# Patient Record
Sex: Female | Born: 1940 | Race: White | Hispanic: No | Marital: Married | Smoking: Former smoker
Health system: Southern US, Community
[De-identification: ages and names within clinical notes are randomized; demographics above are authoritative.]

## PROBLEM LIST (undated history)

## (undated) DIAGNOSIS — K8020 Calculus of gallbladder without cholecystitis without obstruction: Secondary | ICD-10-CM

## (undated) DIAGNOSIS — M545 Low back pain, unspecified: Secondary | ICD-10-CM

## (undated) DIAGNOSIS — M21611 Bunion of right foot: Secondary | ICD-10-CM

## (undated) DIAGNOSIS — E785 Hyperlipidemia, unspecified: Secondary | ICD-10-CM

## (undated) DIAGNOSIS — I10 Essential (primary) hypertension: Secondary | ICD-10-CM

## (undated) DIAGNOSIS — M21612 Bunion of left foot: Secondary | ICD-10-CM

## (undated) DIAGNOSIS — G4700 Insomnia, unspecified: Secondary | ICD-10-CM

## (undated) DIAGNOSIS — N882 Stricture and stenosis of cervix uteri: Secondary | ICD-10-CM

## (undated) DIAGNOSIS — I447 Left bundle-branch block, unspecified: Secondary | ICD-10-CM

## (undated) DIAGNOSIS — N816 Rectocele: Secondary | ICD-10-CM

## (undated) DIAGNOSIS — M722 Plantar fascial fibromatosis: Secondary | ICD-10-CM

## (undated) DIAGNOSIS — I639 Cerebral infarction, unspecified: Secondary | ICD-10-CM

## (undated) DIAGNOSIS — N281 Cyst of kidney, acquired: Secondary | ICD-10-CM

## (undated) DIAGNOSIS — K649 Unspecified hemorrhoids: Secondary | ICD-10-CM

## (undated) DIAGNOSIS — R413 Other amnesia: Secondary | ICD-10-CM

## (undated) DIAGNOSIS — D1803 Hemangioma of intra-abdominal structures: Secondary | ICD-10-CM

## (undated) HISTORY — DX: Calculus of gallbladder without cholecystitis without obstruction: K80.20

## (undated) HISTORY — DX: Other amnesia: R41.3

## (undated) HISTORY — DX: Stricture and stenosis of cervix uteri: N88.2

## (undated) HISTORY — DX: Cyst of kidney, acquired: N28.1

## (undated) HISTORY — PX: COLONOSCOPY: SHX174

## (undated) HISTORY — DX: Unspecified hemorrhoids: K64.9

## (undated) HISTORY — DX: Cerebral infarction, unspecified: I63.9

## (undated) HISTORY — DX: Low back pain, unspecified: M54.50

## (undated) HISTORY — DX: Bunion of left foot: M21.612

## (undated) HISTORY — DX: Rectocele: N81.6

## (undated) HISTORY — PX: APPENDECTOMY: SHX54

## (undated) HISTORY — DX: Essential (primary) hypertension: I10

## (undated) HISTORY — DX: Left bundle-branch block, unspecified: I44.7

## (undated) HISTORY — DX: Hyperlipidemia, unspecified: E78.5

## (undated) HISTORY — PX: TONSILLECTOMY: SUR1361

## (undated) HISTORY — DX: Insomnia, unspecified: G47.00

## (undated) HISTORY — DX: Bunion of right foot: M21.611

## (undated) HISTORY — DX: Plantar fascial fibromatosis: M72.2

## (undated) HISTORY — DX: Hemangioma of intra-abdominal structures: D18.03

## (undated) HISTORY — DX: Low back pain: M54.5

---

## 2001-06-15 ENCOUNTER — Encounter: Admission: RE | Admit: 2001-06-15 | Discharge: 2001-06-15 | Payer: Self-pay | Admitting: Family Medicine

## 2001-06-15 ENCOUNTER — Encounter: Payer: Self-pay | Admitting: Family Medicine

## 2002-04-26 ENCOUNTER — Other Ambulatory Visit: Admission: RE | Admit: 2002-04-26 | Discharge: 2002-04-26 | Payer: Self-pay | Admitting: Family Medicine

## 2002-09-18 ENCOUNTER — Ambulatory Visit (HOSPITAL_COMMUNITY): Admission: RE | Admit: 2002-09-18 | Discharge: 2002-09-18 | Payer: Self-pay | Admitting: Gastroenterology

## 2003-04-29 ENCOUNTER — Other Ambulatory Visit: Admission: RE | Admit: 2003-04-29 | Discharge: 2003-04-29 | Payer: Self-pay | Admitting: Family Medicine

## 2004-03-03 ENCOUNTER — Other Ambulatory Visit: Admission: RE | Admit: 2004-03-03 | Discharge: 2004-03-03 | Payer: Self-pay | Admitting: Family Medicine

## 2005-03-26 ENCOUNTER — Other Ambulatory Visit: Admission: RE | Admit: 2005-03-26 | Discharge: 2005-03-26 | Payer: Self-pay | Admitting: Family Medicine

## 2005-12-20 ENCOUNTER — Encounter: Admission: RE | Admit: 2005-12-20 | Discharge: 2005-12-20 | Payer: Self-pay | Admitting: Orthopedic Surgery

## 2009-05-08 ENCOUNTER — Encounter: Admission: RE | Admit: 2009-05-08 | Discharge: 2009-05-08 | Payer: Self-pay | Admitting: Family Medicine

## 2011-01-01 ENCOUNTER — Other Ambulatory Visit: Payer: Self-pay | Admitting: Family Medicine

## 2011-01-01 DIAGNOSIS — D1803 Hemangioma of intra-abdominal structures: Secondary | ICD-10-CM

## 2011-01-07 ENCOUNTER — Other Ambulatory Visit: Payer: Self-pay

## 2011-01-08 ENCOUNTER — Ambulatory Visit
Admission: RE | Admit: 2011-01-08 | Discharge: 2011-01-08 | Disposition: A | Discharge status: Home / Self Care | Payer: Medicare Other | Source: Ambulatory Visit | Attending: Family Medicine | Admitting: Family Medicine

## 2011-01-08 DIAGNOSIS — D1803 Hemangioma of intra-abdominal structures: Secondary | ICD-10-CM

## 2011-04-23 NOTE — Op Note (Signed)
NAMEINDIRA, Jennifer Arroyo                         ACCOUNT NO.:  [id number]   MEDICAL RECORD NO.:  [id number]                   PATIENT TYPE:  AMB   LOCATION:  ENDO                                 FACILITY:  [facility]   PHYSICIAN:  Anselmo Rod, M.D.               DATE OF BIRTH:  [birth year]   DATE OF PROCEDURE:  09/18/2002  DATE OF DISCHARGE:                                 OPERATIVE REPORT   PROCEDURE:  Screening colonoscopy.   ENDOSCOPIST:  Anselmo Rod, M.D.   INSTRUMENT USED:  Olympus video colonoscope.   INDICATION FOR PROCEDURE:  Rectal bleeding in a 70 year old white female.  Rule out colonic polyps, masses, hemorrhoids, etc.   PREPROCEDURE PREPARATION:  Informed consent was procured from the patient.  The patient was fasted for eight hours prior to the procedure and prepped  with a bottle of magnesium citrate and a gallon of NuLytely then night prior  to the procedure.   PHYSICAL EXAMINATION:  VITAL SIGNS:  The patient had stable vital signs.  NECK:  Supple.  CHEST:  Clear to auscultation.  S1, S2 regular.  ABDOMEN:  Soft with normal bowel sounds.   DESCRIPTION OF PROCEDURE:  The patient was placed in the left lateral  decubitus position and sedated with 80 mg of Demerol and 8 mg of Versed  intravenously.  Once the patient was adequately sedate and maintained on low-  flow oxygen and continuous cardiac monitoring, the Olympus video colonoscope  was advanced from the rectum to the cecum and terminal ileum without  difficulty.  The entire colonic mucosa appeared healthy with a normal  vascular pattern.  No masses, polyps, erosions, ulcerations, or diverticula  were seen.  The patient had a small external hemorrhoid seen on anal  inspection and moderate-sized nonbleeding internal hemorrhoid seen on  retroflexion.  The patient tolerated the procedure well without  complication.   IMPRESSION:  1. Normal-appearing colon up to the terminal ileum.  2. Small external  hemorrhoid.  3. Moderate-sized nonbleeding internal hemorrhoid.  4. No masses, polyps, or diverticula seen.   RECOMMENDATIONS:  1. A high-fiber diet with liberal fluid intake has been advocated for the     patient.  2. Anusol-HC 2.5% suppositories to be tried one p.r. q.h.s., #30 have been     prescribed with one refill.  3.     Outpatient follow-up in the next two weeks for further recommendations.  4. Repeat colorectal cancer screening in the next 10 years unless the     patient develops any abnormal symptoms in the interim.                                                 Anselmo Rod, M.D.    JNM/MEDQ  D:  09/18/2002  T:  09/18/2002  Job:  [id number]   cc:   Talmadge Coventry, M.D.

## 2012-09-21 ENCOUNTER — Ambulatory Visit
Admission: RE | Admit: 2012-09-21 | Discharge: 2012-09-21 | Disposition: A | Discharge status: Home / Self Care | Payer: Medicare Other | Source: Ambulatory Visit | Attending: Family Medicine | Admitting: Family Medicine

## 2012-09-21 ENCOUNTER — Other Ambulatory Visit: Payer: Self-pay | Admitting: Family Medicine

## 2012-09-21 DIAGNOSIS — N281 Cyst of kidney, acquired: Secondary | ICD-10-CM

## 2012-09-28 ENCOUNTER — Other Ambulatory Visit: Payer: Self-pay | Admitting: Family Medicine

## 2012-09-28 DIAGNOSIS — N281 Cyst of kidney, acquired: Secondary | ICD-10-CM

## 2012-10-04 ENCOUNTER — Ambulatory Visit
Admission: RE | Admit: 2012-10-04 | Discharge: 2012-10-04 | Disposition: A | Discharge status: Home / Self Care | Payer: Medicare Other | Source: Ambulatory Visit | Attending: Family Medicine | Admitting: Family Medicine

## 2012-10-04 DIAGNOSIS — N281 Cyst of kidney, acquired: Secondary | ICD-10-CM

## 2012-10-04 MED ORDER — GADOBENATE DIMEGLUMINE 529 MG/ML IV SOLN
15.0000 mL | Freq: Once | INTRAVENOUS | Status: AC | PRN
  Administered 2012-10-04: 15 mL via INTRAVENOUS

## 2014-11-23 ENCOUNTER — Encounter: Payer: Self-pay | Admitting: *Deleted

## 2018-01-06 DIAGNOSIS — I639 Cerebral infarction, unspecified: Secondary | ICD-10-CM

## 2018-01-06 HISTORY — DX: Cerebral infarction, unspecified: I63.9

## 2019-01-10 ENCOUNTER — Other Ambulatory Visit: Payer: Self-pay | Admitting: Family Medicine

## 2019-01-10 ENCOUNTER — Encounter: Payer: Self-pay | Admitting: *Deleted

## 2019-01-10 DIAGNOSIS — R413 Other amnesia: Secondary | ICD-10-CM

## 2019-01-11 ENCOUNTER — Telehealth: Payer: Self-pay | Admitting: Neurology

## 2019-01-11 ENCOUNTER — Ambulatory Visit: Payer: Medicare Other | Admitting: Neurology

## 2019-01-11 ENCOUNTER — Encounter: Payer: Self-pay | Admitting: Neurology

## 2019-01-11 VITALS — BP 140/88 | HR 93 | Ht 64.0 in | Wt 171.5 lb

## 2019-01-11 DIAGNOSIS — R413 Other amnesia: Secondary | ICD-10-CM | POA: Insufficient documentation

## 2019-01-11 DIAGNOSIS — R269 Unspecified abnormalities of gait and mobility: Secondary | ICD-10-CM | POA: Insufficient documentation

## 2019-01-11 DIAGNOSIS — G3281 Cerebellar ataxia in diseases classified elsewhere: Secondary | ICD-10-CM

## 2019-01-11 MED ORDER — MEMANTINE HCL 10 MG PO TABS: 10.0000 mg | ORAL_TABLET | Freq: Two times a day (BID) | ORAL | 11 refills | Status: DC

## 2019-01-11 NOTE — Telephone Encounter (Signed)
[employer] Medicare order sent to [facility]. No auth they will reach out to the pt to schedule.  °

## 2019-01-11 NOTE — Progress Notes (Signed)
PATIENT: Jennifer Arroyo DOB: [birth year]  Chief Complaint  Patient presents with  . Memory Loss    MMSE 27/30 - 12 animals.  She is here with her husband, Juanda Crumble, to have her declining memory evaluated.   Marland Kitchen PCP    Kathyrn Lass, MD     HISTORICAL  Jennifer Arroyo is a 78 year old female, seen in request by her primary care physician Dr. Sabra Heck, Lattie Haw for evaluation of memory loss, gait abnormality, she is accompanied by her husband at today's visit, initial evaluation was on January 11, 2019.  I have reviewed and summarized the referring note from the referring physician. 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, has no children, enjoys playing bridges regularly, her older brother suffered vascular dementia, mother died at 64, suffered stroke after giving birth to her  She was noted to have gradual onset memory loss since 2018, getting worse in 2019, it is hard for her to keep up with her bridge game, difficulty concentrate, following conversations, no longer active at home, tends to stay up late, and get up late during the day,  She has history of chronic low back pain, deformity of bilateral feet, gradually increased gait abnormality  Laboratory evaluation in January 2020, showed LDL 92, normal CMP creatinine of 0.74, CBC hemoglobin of 15, vitamin B12 429, normal TSH 1.78   REVIEW OF SYSTEMS: Full 14 system review of systems performed and notable only for memory loss All other review of systems were negative.  ALLERGIES: Allergies  Allergen Reactions  . Amoxicillin Nausea Only  . Ceclor [Cefaclor] Hives  . Levaquin [Levofloxacin] Other (See Comments)  . Nystatin Other (See Comments)    Black scale around teeth.    HOME MEDICATIONS: Current Outpatient Medications  Medication Sig Dispense Refill  . ALPRAZolam (XANAX) 0.25 MG  tablet Take 0.25 mg by mouth at bedtime as needed for sleep.     Marland Kitchen aspirin 325 MG tablet Take 325 mg by mouth daily.    Marland Kitchen CALCIUM MAGNESIUM 750 PO Take by mouth.    . Coenzyme Q10 (CO Q-10) 100 MG CAPS Take 1 capsule by mouth daily.    . Cyanocobalamin (VITAMIN B-12 PO) Take 1 tablet by mouth daily.    . hydrochlorothiazide (HYDRODIURIL) 25 MG tablet Take 25 mg by mouth daily.    . meloxicam (MOBIC) 7.5 MG tablet Take 7.5 mg by mouth as needed.     . metoprolol succinate (TOPROL-XL) 25 MG 24 hr tablet Take 25 mg by mouth daily.    . Omega-3 Fatty Acids (OMEGA 3 PO) Take 1,200 mg by mouth daily.    . rosuvastatin (CRESTOR) 10 MG tablet Take 10 mg by mouth daily.    Marland Kitchen VITAMIN D PO Take 2,000 Units by mouth daily.    Marland Kitchen zolpidem (AMBIEN) 10 MG tablet Take 10 mg by mouth at bedtime as needed for sleep.     No current facility-administered medications for this visit.     PAST MEDICAL HISTORY: Past Medical History:  Diagnosis Date  . Bilateral bunions    hammer toe  . Cervical os stenosis   . Gallstones   . Hemangioma of liver    probable  . Hemorrhoids   . Hyperlipemia   . Hypertension   . Insomnia   . Left bundle branch block   . Left bundle branch block   .  Low back pain   . Memory loss   . Plantar fasciitis   . Rectocele   . Renal cyst     PAST SURGICAL HISTORY: Past Surgical History:  Procedure Laterality Date  . APPENDECTOMY    . COLONOSCOPY    . TONSILLECTOMY      FAMILY HISTORY: Family History  Problem Relation Age of Onset  . Liver cancer Father   . Hypertension Brother   . Hyperlipidemia Brother   . Stroke Mother   . Rheumatic fever Mother   . Heart disease Mother     SOCIAL HISTORY: Social History   Socioeconomic History  . Marital status: Married    Spouse name: Not on file  . Number of children: 0  . Years of education: college  . Highest education level: Not on file  Occupational History  . Occupation: Retired Education officer, museum  Social Needs  .  Financial resource strain: Not on file  . Food insecurity:    Worry: Not on file    Inability: Not on file  . Transportation needs:    Medical: Not on file    Non-medical: Not on file  Tobacco Use  . Smoking status: Former Smoker    Types: Cigarettes  . Smokeless tobacco: Never Used  . Tobacco comment: quit at age 59, 39 PPD x 10 year history  Substance and Sexual Activity  . Alcohol use: Yes    Comment: rare  . Drug use: Never  . Sexual activity: Not on file  Lifestyle  . Physical activity:    Days per week: Not on file    Minutes per session: Not on file  . Stress: Not on file  Relationships  . Social connections:    Talks on phone: Not on file    Gets together: Not on file    Attends religious service: Not on file    Active member of club or organization: Not on file    Attends meetings of clubs or organizations: Not on file    Relationship status: Not on file  . Intimate partner violence:    Fear of current or ex partner: Not on file    Emotionally abused: Not on file    Physically abused: Not on file    Forced sexual activity: Not on file  Other Topics Concern  . Not on file  Social History Narrative   Lives at home with husband.   Right-handed.   2 cups caffeine daily.     PHYSICAL EXAM   Vitals:   01/11/19 0959  BP: 140/88  Pulse: 93  Weight: 171 lb 8 oz (77.8 kg)  Height: 5\' 4"  (1.626 m)    Not recorded      Body mass index is 29.44 kg/m.  PHYSICAL EXAMNIATION:  Gen: NAD, conversant, well nourised, obese, well groomed                     Cardiovascular: Regular rate rhythm, no peripheral edema, warm, nontender. Eyes: Conjunctivae clear without exudates or hemorrhage Neck: Supple, no carotid bruits. Pulmonary: Clear to auscultation bilaterally   NEUROLOGICAL EXAM: MMSE - Mini Mental State Exam 01/11/2019  Orientation to time 5  Orientation to Place 5  Registration 3  Attention/ Calculation 5  Recall 0  Language- name 2 objects 2    Language- repeat 1  Language- follow 3 step command 3  Language- read & follow direction 1  Write a sentence 1  Copy design 1  Total score  27  animal naming 12.   CRANIAL NERVES: CN II: Visual fields are full to confrontation.  Pupils are round equal and briskly reactive to light. CN III, IV, VI: extraocular movement are normal. No ptosis. CN V: Facial sensation is intact to pinprick in all 3 divisions bilaterally. Corneal responses are intact.  CN VII: Face is symmetric with normal eye closure and smile. CN VIII: Hearing is normal to rubbing fingers CN IX, X: Palate elevates symmetrically. Phonation is normal. CN XI: Head turning and shoulder shrug are intact CN XII: Tongue is midline with normal movements and no atrophy.  MOTOR: She has deformity of bilateral hands, bilateral hammertoes,  REFLEXES: Reflexes are 2+ and symmetric at the biceps, triceps, 3/3 at knees, and decreased at ankles. Plantar responses are flexor.  SENSORY: Length dependent decreased to light touch, pinprick, and vibratory sensation to ankle level  COORDINATION: Rapid alternating movements and fine finger movements are intact. There is no dysmetria on finger-to-nose and heel-knee-shin.    GAIT/STANCE: She needs pushed up to get up from seated position, antalgic, cautious mildly unsteady, difficulty stand up on heels,   DIAGNOSTIC DATA (LABS, IMAGING, TESTING) - I reviewed patient records, labs, notes, testing and imaging myself where available.   ASSESSMENT AND PLAN  Jennifer Arroyo is a 78 y.o. female   Mild cognitive impairment  Mini-Mental Status Examination 27/30  MRI of the brain without contrast  Gait abnormality  Chronic low back pain,  Differentiation diagnosis includes cervical spondylitic myelopathy, lumbar radiculopathy  Proceed with MRI of lumbar spine  Refer to rehab   Marcial Pacas, M.D. Ph.D.  [facility] [address], [place], [place] [address] Ph:  [phone] Fax: [phone]  CC: Kathyrn Lass, MD

## 2019-01-11 NOTE — Telephone Encounter (Signed)
Patient was seen today by Dr. Krista Blue. After her appointment, patient did not stop by check out to make her follow-up. I called and left voicemail for patient to call us back to schedule her 2 month follow up.

## 2019-01-22 ENCOUNTER — Other Ambulatory Visit: Payer: Self-pay

## 2019-01-26 ENCOUNTER — Ambulatory Visit
Admission: RE | Admit: 2019-01-26 | Discharge: 2019-01-26 | Disposition: A | Discharge status: Home / Self Care | Payer: Medicare Other | Source: Ambulatory Visit | Attending: Neurology | Admitting: Neurology

## 2019-01-26 DIAGNOSIS — R413 Other amnesia: Secondary | ICD-10-CM

## 2019-01-26 DIAGNOSIS — G3281 Cerebellar ataxia in diseases classified elsewhere: Secondary | ICD-10-CM | POA: Diagnosis not present

## 2019-01-29 ENCOUNTER — Telehealth: Payer: Self-pay | Admitting: Neurology

## 2019-01-29 NOTE — Telephone Encounter (Signed)
Please call patient, MRI of brain showed evidence of generalized atrophy, deep brain signal abnormalities, these are all chronic changes,  MRI of lumbar spine showed multilevel degenerative changes, variable degree of spinal canal and foraminal narrowing,   I will review films at her next follow-up visit,   1.    At T12-L1, there appears to be degenerative fusion.  There does not appear to be nerve root compression or spinal stenosis. 2.    At L1-L2: There is very severe endplate spurring causing moderate spinal stenosis in both the transverse and AP diameters.  There is moderate left lateral recess stenosis but no definite nerve root compression. 3.    At L2-L3, there appears to be degenerative fusion.  There is severe endplate spurring with a large midline bony ridge.  There is severe left lateral recess stenosis with probable left L3 nerve root compression. 4.    At L3-L4, degenerative changes: Spinal stenosis in the transverse diameter and moderately severe left foraminal narrowing with possible left L3 nerve root compression. 5.    At L4-L5, there are degenerative changes causing moderate left greater than right foraminal narrowing but no nerve root compression or spinal stenosis. 6.    At L5-S1, there is mild anterolisthesis and severe facet hypertrophy.  There does not appear to impede nerve root compression or spinal stenosis. 7.    The bone marrow has a mottled appearance.  This is usually due to age related bone marrow changes if clinically indicated, consider evaluation for paraproteinemia. 8.    Large renal cyst that appears similar in appearance to what was noted on the abdominal MRI from 2013.  IMPRESSION: This MRI of the brain without contrast shows the following: 1.    Moderate generalized cortical atrophy that is more severe in the anterior mesial temporal lobes bilaterally. 2.    Symmetric T2 hyperintensity of the corticospinal tract in the brain and brainstem.  This is  nonspecific but can be seen with neurologic disorders such as amyotrophic lateral sclerosis, primary lateral sclerosis, X-linked CMT and other rare genetic disorders. 3.    Superimposed chronic microvascular ischemic change.  There are no acute findings.

## 2019-01-29 NOTE — Telephone Encounter (Signed)
Spoke to patient's husband on [place].  He is aware of results and verbalized understanding.  He will provide these to the patient.  She will call back with any questions.  She will keep her pending follow up for further review with Dr. Krista Blue.

## 2019-02-19 ENCOUNTER — Ambulatory Visit: Payer: Medicare Other | Admitting: Rehabilitative and Restorative Service Providers"

## 2019-04-02 ENCOUNTER — Other Ambulatory Visit: Payer: Self-pay

## 2019-04-02 ENCOUNTER — Ambulatory Visit (INDEPENDENT_AMBULATORY_CARE_PROVIDER_SITE_OTHER): Payer: Medicare Other | Admitting: Neurology

## 2019-04-02 DIAGNOSIS — R269 Unspecified abnormalities of gait and mobility: Secondary | ICD-10-CM

## 2019-04-02 DIAGNOSIS — R413 Other amnesia: Secondary | ICD-10-CM | POA: Diagnosis not present

## 2019-04-02 MED ORDER — DONEPEZIL HCL 10 MG PO TABS: 10.0000 mg | ORAL_TABLET | Freq: Every day | ORAL | 11 refills | Status: DC

## 2019-04-02 NOTE — Progress Notes (Signed)
PATIENT: Jennifer Arroyo DOB: [birth year]  No chief complaint on file.    HISTORICAL  JOSCELINE Arroyo is a 78 year old female, seen in request by her primary care physician Dr. Sabra Heck, Lattie Haw for evaluation of memory loss, gait abnormality, she is accompanied by her husband at today's visit, initial evaluation was on January 11, 2019.  I have reviewed and summarized the referring note from the referring physician. 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, has no children, enjoys playing bridges regularly, her older brother suffered vascular dementia, mother died at 30, suffered stroke after giving birth to her  She was noted to have gradual onset memory loss since 2018, getting worse in 2019, it is hard for her to keep up with her bridge game, difficulty concentrate, following conversations, no longer active at home, tends to stay up late, and get up late during the day,  She has history of chronic low back pain, deformity of bilateral feet, gradually increased gait abnormality  Laboratory evaluation in January 2020, showed LDL 92, normal CMP creatinine of 0.74, CBC hemoglobin of 15, vitamin B12 429, normal TSH 1.78  Virtual Visit via video  I connected with [name] on 04/02/19 at  by video and verified that I am speaking with the correct person using two identifiers.   I discussed the limitations, risks, security and privacy concerns of performing an evaluation and management service by video and the availability of in person appointments. I also discussed with the patient that there may be a patient responsible charge related to this service. The patient expressed understanding and agreed to proceed.   History of Present Illness: She is with her husband during today's interview, she was noted to have worsening memory loss, tolerating  Namenda 10 mg twice a day, mildly unsteady gait, fell twice since last visit, there was no significant agitations  I personally reviewed MRI of the brain on January 26, 2019, moderate generalized atrophy, most severe in the anterior mesial temporal lobe bilaterally, symmetric T2 hyperintensity of the corticospinal tract in the brain and brainstem, with superimposed chronic microvascular ischemic changes.  MRI of lumbar: Severe multilevel degenerative changes, moderate spinal stenosis at L1-2, variable degree of foraminal narrowing, large renal cyst, that was stable compared to previous appearance from MRI in 2013   Observations/Objective: I have reviewed problem lists, medications, allergies.  MMSE - Mini Mental State Exam 01/11/2019  Orientation to time 5  Orientation to Place 5  Registration 3  Attention/ Calculation 5  Recall 0  Language- name 2 objects 2  Language- repeat 1  Language- follow 3 step command 3  Language- read & follow direction 1  Write a sentence 1  Copy design 1  Total score 27   Assessment and Plan: Mild cognitive impairment  Continue Namenda 10 mg twice a day,  Add on Aricept 10 mg daily  Gait abnormality  Multifactorial, this including deconditioning, aging, cerebral small vessel disease, lumbar stenosis,  Encouraged her moderate exercise    Follow Up Instructions:  In 6 months    I discussed the assessment and treatment plan with the patient. The patient was provided an opportunity to ask questions and all were answered. The patient agreed with the plan and demonstrated an understanding of the instructions.   The patient was advised to call back or seek an in-person evaluation if  the symptoms worsen or if the condition fails to improve as anticipated.  I provided 30 minutes of non-face-to-face time during this encounter.   Marcial Pacas, MD    REVIEW OF SYSTEMS: Full 14 system review of systems performed and notable only for memory loss All other  review of systems were negative.  ALLERGIES: Allergies  Allergen Reactions  . Amoxicillin Nausea Only  . Ceclor [Cefaclor] Hives  . Levaquin [Levofloxacin] Other (See Comments)  . Nystatin Other (See Comments)    Black scale around teeth.    HOME MEDICATIONS: Current Outpatient Medications  Medication Sig Dispense Refill  . ALPRAZolam (XANAX) 0.25 MG tablet Take 0.25 mg by mouth at bedtime as needed for sleep.     Marland Kitchen aspirin 325 MG tablet Take 325 mg by mouth daily.    Marland Kitchen CALCIUM MAGNESIUM 750 PO Take by mouth.    . Coenzyme Q10 (CO Q-10) 100 MG CAPS Take 1 capsule by mouth daily.    . Cyanocobalamin (VITAMIN B-12 PO) Take 1 tablet by mouth daily.    . hydrochlorothiazide (HYDRODIURIL) 25 MG tablet Take 25 mg by mouth daily.    . meloxicam (MOBIC) 7.5 MG tablet Take 7.5 mg by mouth as needed.     . memantine (NAMENDA) 10 MG tablet Take 1 tablet (10 mg total) by mouth 2 (two) times daily. 60 tablet 11  . metoprolol succinate (TOPROL-XL) 25 MG 24 hr tablet Take 25 mg by mouth daily.    . Omega-3 Fatty Acids (OMEGA 3 PO) Take 1,200 mg by mouth daily.    . rosuvastatin (CRESTOR) 10 MG tablet Take 10 mg by mouth daily.    Marland Kitchen VITAMIN D PO Take 2,000 Units by mouth daily.    Marland Kitchen zolpidem (AMBIEN) 10 MG tablet Take 10 mg by mouth at bedtime as needed for sleep.     No current facility-administered medications for this visit.     PAST MEDICAL HISTORY: Past Medical History:  Diagnosis Date  . Bilateral bunions    hammer toe  . Cervical os stenosis   . Gallstones   . Hemangioma of liver    probable  . Hemorrhoids   . Hyperlipemia   . Hypertension   . Insomnia   . Left bundle branch block   . Left bundle branch block   . Low back pain   . Memory loss   . Plantar fasciitis   . Rectocele   . Renal cyst     PAST SURGICAL HISTORY: Past Surgical History:  Procedure Laterality Date  . APPENDECTOMY    . COLONOSCOPY    . TONSILLECTOMY      FAMILY HISTORY: Family History   Problem Relation Age of Onset  . Liver cancer Father   . Hypertension Brother   . Hyperlipidemia Brother   . Stroke Mother   . Rheumatic fever Mother   . Heart disease Mother     SOCIAL HISTORY: Social History   Socioeconomic History  . Marital status: Married    Spouse name: Not on file  . Number of children: 0  . Years of education: college  . Highest education level: Not on file  Occupational History  . Occupation: Retired Education officer, museum  Social Needs  . Financial resource strain: Not on file  . Food insecurity:    Worry: Not on file    Inability: Not on file  . Transportation needs:    Medical: Not on file    Non-medical: Not on file  Tobacco Use  .  Smoking status: Former Smoker    Types: Cigarettes  . Smokeless tobacco: Never Used  . Tobacco comment: quit at age 33, 57 PPD x 10 year history  Substance and Sexual Activity  . Alcohol use: Yes    Comment: rare  . Drug use: Never  . Sexual activity: Not on file  Lifestyle  . Physical activity:    Days per week: Not on file    Minutes per session: Not on file  . Stress: Not on file  Relationships  . Social connections:    Talks on phone: Not on file    Gets together: Not on file    Attends religious service: Not on file    Active member of club or organization: Not on file    Attends meetings of clubs or organizations: Not on file    Relationship status: Not on file  . Intimate partner violence:    Fear of current or ex partner: Not on file    Emotionally abused: Not on file    Physically abused: Not on file    Forced sexual activity: Not on file  Other Topics Concern  . Not on file  Social History Narrative   Lives at home with husband.   Right-handed.   2 cups caffeine daily.     PHYSICAL EXAM   There were no vitals filed for this visit.  Not recorded      There is no height or weight on file to calculate BMI.  PHYSICAL EXAMNIATION:  Gen: NAD, conversant, well nourised, obese, well groomed                      Cardiovascular: Regular rate rhythm, no peripheral edema, warm, nontender. Eyes: Conjunctivae clear without exudates or hemorrhage Neck: Supple, no carotid bruits. Pulmonary: Clear to auscultation bilaterally   NEUROLOGICAL EXAM: MMSE - Mini Mental State Exam 01/11/2019  Orientation to time 5  Orientation to Place 5  Registration 3  Attention/ Calculation 5  Recall 0  Language- name 2 objects 2  Language- repeat 1  Language- follow 3 step command 3  Language- read & follow direction 1  Write a sentence 1  Copy design 1  Total score 27  animal naming 12.   CRANIAL NERVES: CN II: Visual fields are full to confrontation.  Pupils are round equal and briskly reactive to light. CN III, IV, VI: extraocular movement are normal. No ptosis. CN V: Facial sensation is intact to pinprick in all 3 divisions bilaterally. Corneal responses are intact.  CN VII: Face is symmetric with normal eye closure and smile. CN VIII: Hearing is normal to rubbing fingers CN IX, X: Palate elevates symmetrically. Phonation is normal. CN XI: Head turning and shoulder shrug are intact CN XII: Tongue is midline with normal movements and no atrophy.  MOTOR: She has deformity of bilateral hands, bilateral hammertoes,  REFLEXES: Reflexes are 2+ and symmetric at the biceps, triceps, 3/3 at knees, and decreased at ankles. Plantar responses are flexor.  SENSORY: Length dependent decreased to light touch, pinprick, and vibratory sensation to ankle level  COORDINATION: Rapid alternating movements and fine finger movements are intact. There is no dysmetria on finger-to-nose and heel-knee-shin.    GAIT/STANCE: She needs pushed up to get up from seated position, antalgic, cautious mildly unsteady, difficulty stand up on heels,   DIAGNOSTIC DATA (LABS, IMAGING, TESTING) - I reviewed patient records, labs, notes, testing and imaging myself where available.   ASSESSMENT AND PLAN  NOTNAMED CROUCHER is a 78 y.o. female   Mild cognitive impairment  Mini-Mental Status Examination 27/30  MRI of the brain without contrast  Gait abnormality  Chronic low back pain,  Differentiation diagnosis includes cervical spondylitic myelopathy, lumbar radiculopathy  Proceed with MRI of lumbar spine  Refer to rehab   Marcial Pacas, M.D. Ph.D.  [facility] [address], [place], [place] [address] Ph: [phone] Fax: [phone]  CC: Kathyrn Lass, MD

## 2019-04-06 ENCOUNTER — Encounter: Payer: Self-pay | Admitting: Neurology

## 2020-01-14 ENCOUNTER — Other Ambulatory Visit: Payer: Self-pay | Admitting: Neurology

## 2020-01-28 ENCOUNTER — Other Ambulatory Visit: Payer: Self-pay | Admitting: Neurology

## 2020-02-20 ENCOUNTER — Other Ambulatory Visit: Payer: Self-pay | Admitting: Neurology

## 2020-03-26 NOTE — Progress Notes (Signed)
PATIENT: Jennifer Arroyo DOB: 07/17/1941  REASON FOR VISIT: follow up HISTORY FROM: patient  HISTORY OF PRESENT ILLNESS: Today 03/27/20  HISTORY  Jennifer Arroyo is a 79 year old female, seen in request by her primary care physician Dr. Kathyrn Lass for evaluation of memory loss, gait abnormality, she is accompanied by her husband at today's visit, initial evaluation was on January 11, 2019.  I have reviewed and summarized the referring note from the referring physician. 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, has no children, enjoys playing bridges regularly, her older brother suffered vascular dementia, mother died at 87, suffered stroke after giving birth to her  She was noted to have gradual onset memory loss since 2018, getting worse in 2019, it is hard for her to keep up with her bridge game, difficulty concentrate, following conversations, no longer active at home, tends to stay up late, and get up late during the day,  She has history of chronic low back pain, deformity of bilateral feet, gradually increased gait abnormality  Laboratory evaluation in January 2020, showed LDL 92, normal CMP creatinine of 0.74, CBC hemoglobin of 15, vitamin B12 429, normal TSH 1.78  Virtual Visit via video 04/02/2019 Dr. Krista Blue:  History of Present Illness: She is with her husband during today's interview, she was noted to have worsening memory loss, tolerating Namenda 10 mg twice a day, mildly unsteady gait, fell twice since last visit, there was no significant agitations  I personally reviewed MRI of the brain on January 26, 2019, moderate generalized atrophy, most severe in the anterior mesial temporal lobe bilaterally, symmetric T2 hyperintensity of the corticospinal tract in the brain and brainstem, with superimposed chronic microvascular  ischemic changes.  MRI of lumbar: Severe multilevel degenerative changes, moderate spinal stenosis at L1-2, variable degree of foraminal narrowing, large renal cyst, that was stable compared to previous appearance from MRI in 2013   Update March 27, 2020 SS: MMSE 23/30 today, was 27/30 1 year ago.   Here with her husband. She feels doing pretty well, she has trouble engaging in conversation, gets confused. She does ADLs, appetite is good. She doesn't cook in 2 years, but she thinks she does. She sleeps well. During the day, watches TV, doesn't help with housework. She stays up late, sleeps late during the day. Plays on IPAD. Overall health is good, a few falls in the last 6 months, not injured. Doesn't drive. Living in own home right, they are looking at retirement facilities. Sees PCP for routine visits. Her husband keeps up cooking and housekeeping. She is pleasant, not agitated.   REVIEW OF SYSTEMS: Out of a complete 14 system review of symptoms, the patient complains only of the following symptoms, and all other reviewed systems are negative.  Memory loss  ALLERGIES: Allergies  Allergen Reactions  . Amoxicillin Nausea Only  . Ceclor [Cefaclor] Hives  . Levaquin [Levofloxacin] Other (See Comments)  . Nystatin Other (See Comments)    Black scale around teeth.    HOME MEDICATIONS: Outpatient Medications Prior to Visit  Medication Sig Dispense Refill  . ALPRAZolam (XANAX) 0.25 MG tablet Take 0.25 mg by mouth at bedtime as needed for sleep.     Marland Kitchen aspirin 325 MG tablet Take 325 mg by mouth daily.    Marland Kitchen CALCIUM MAGNESIUM 750 PO Take by mouth.    . Coenzyme  Q10 (CO Q-10) 100 MG CAPS Take 1 capsule by mouth daily.    . Cyanocobalamin (VITAMIN B-12 PO) Take 1 tablet by mouth daily.    Marland Kitchen donepezil (ARICEPT) 10 MG tablet Take 1 tablet (10 mg total) by mouth at bedtime. 30 tablet 11  . hydrochlorothiazide (HYDRODIURIL) 25 MG tablet Take 25 mg by mouth daily.    . meloxicam (MOBIC) 7.5 MG  tablet Take 7.5 mg by mouth as needed.     . memantine (NAMENDA) 10 MG tablet Take 1 tablet (10 mg total) by mouth 2 (two) times daily. Please call [phone] to schedule appt. 60 tablet 0  . metoprolol succinate (TOPROL-XL) 25 MG 24 hr tablet Take 25 mg by mouth daily.    . Omega-3 Fatty Acids (OMEGA 3 PO) Take 1,200 mg by mouth daily.    . rosuvastatin (CRESTOR) 10 MG tablet Take 10 mg by mouth daily.    Marland Kitchen VITAMIN D PO Take 2,000 Units by mouth daily.    Marland Kitchen zolpidem (AMBIEN) 10 MG tablet Take 10 mg by mouth at bedtime as needed for sleep.     No facility-administered medications prior to visit.    PAST MEDICAL HISTORY: Past Medical History:  Diagnosis Date  . Bilateral bunions    hammer toe  . Cervical os stenosis   . Gallstones   . Hemangioma of liver    probable  . Hemorrhoids   . Hyperlipemia   . Hypertension   . Insomnia   . Left bundle branch block   . Left bundle branch block   . Low back pain   . Memory loss   . Plantar fasciitis   . Rectocele   . Renal cyst     PAST SURGICAL HISTORY: Past Surgical History:  Procedure Laterality Date  . APPENDECTOMY    . COLONOSCOPY    . TONSILLECTOMY      FAMILY HISTORY: Family History  Problem Relation Age of Onset  . Liver cancer Father   . Hypertension Brother   . Hyperlipidemia Brother   . Stroke Mother   . Rheumatic fever Mother   . Heart disease Mother     SOCIAL HISTORY: Social History   Socioeconomic History  . Marital status: Married    Spouse name: Not on file  . Number of children: 0  . Years of education: college  . Highest education level: Not on file  Occupational History  . Occupation: Retired Education officer, museum  Tobacco Use  . Smoking status: Former Smoker    Types: Cigarettes  . Smokeless tobacco: Never Used  . Tobacco comment: quit at age 69, 20 PPD x 10 year history  Substance and Sexual Activity  . Alcohol use: Yes    Comment: rare  . Drug use: Never  . Sexual activity: Not on file  Other  Topics Concern  . Not on file  Social History Narrative   Lives at home with husband.   Right-handed.   2 cups caffeine daily.   Social Determinants of Health   Financial Resource Strain:   . Difficulty of Paying Living Expenses:   Food Insecurity:   . Worried About Charity fundraiser in the Last Year:   . Arboriculturist in the Last Year:   Transportation Needs:   . Film/video editor (Medical):   Marland Kitchen Lack of Transportation (Non-Medical):   Physical Activity:   . Days of Exercise per Week:   . Minutes of Exercise per Session:   Stress:   .  Feeling of Stress :   Social Connections:   . Frequency of Communication with Friends and Family:   . Frequency of Social Gatherings with Friends and Family:   . Attends Religious Services:   . Active Member of Clubs or Organizations:   . Attends Archivist Meetings:   Marland Kitchen Marital Status:   Intimate Partner Violence:   . Fear of Current or Ex-Partner:   . Emotionally Abused:   Marland Kitchen Physically Abused:   . Sexually Abused:    PHYSICAL EXAM  Vitals:   03/27/20 0742  BP: (!) 145/72  Pulse: 84  Temp: 98.7 F (37.1 C)  Weight: 195 lb (88.5 kg)  Height: 5\' 6"  (1.676 m)   Body mass index is 31.47 kg/m.  Generalized: Well developed, in no acute distress  MMSE - Mini Mental State Exam 03/27/2020 01/11/2019  Orientation to time 1 5  Orientation to Place 4 5  Registration 3 3  Attention/ Calculation 5 5  Recall 2 0  Language- name 2 objects 1 2  Language- repeat 1 1  Language- follow 3 step command 3 3  Language- read & follow direction 1 1  Write a sentence 1 1  Copy design 1 1  Copy design-comments 4 animals -  Total score 23 27    Neurological examination  Mentation: Alert oriented to time, place, most history is provided by her husband, she provides inaccurate details. Follows all commands speech and language fluent Cranial nerve II-XII: Pupils were equal round reactive to light. Extraocular movements were full,  visual field were full on confrontational test. Facial sensation and strength were normal. Head turning and shoulder shrug  were normal and symmetric. Motor: The motor testing reveals 5 over 5 strength of all 4 extremities. Good symmetric motor tone is noted throughout.  Bilateral hammertoes Sensory: Sensory testing is intact to soft touch on all 4 extremities. No evidence of extinction is noted.  Coordination: Cerebellar testing reveals good finger-nose-finger and heel-to-shin bilaterally.  Gait and station: Has to push off from seated position to stand, gait is antalgic, cautious, mildly unsteady, no assistive device was used Reflexes: Deep tendon reflexes are symmetric but somewhat brisk throughout.  DIAGNOSTIC DATA (LABS, IMAGING, TESTING) - I reviewed patient records, labs, notes, testing and imaging myself where available.  No results found for: WBC, HGB, HCT, MCV, PLT No results found for: NA, K, CL, CO2, GLUCOSE, BUN, CREATININE, CALCIUM, PROT, ALBUMIN, AST, ALT, ALKPHOS, BILITOT, GFRNONAA, GFRAA No results found for: CHOL, HDL, LDLCALC, LDLDIRECT, TRIG, CHOLHDL No results found for: HGBA1C No results found for: VITAMINB12 No results found for: TSH    ASSESSMENT AND PLAN 79 y.o. year old female  has a past medical history of Bilateral bunions, Cervical os stenosis, Gallstones, Hemangioma of liver, Hemorrhoids, Hyperlipemia, Hypertension, Insomnia, Left bundle branch block, Left bundle branch block, Low back pain, Memory loss, Plantar fasciitis, Rectocele, and Renal cyst. here with:  1.  Mild cognitive impairment -Some decline in memory score, 23/30, seems to have good support with her husband in the home, he is making plans for them to transition to retirement community -Continue Namenda 10 mg twice a day -Continue Aricept 10 mg daily -Continue follow-up with PCP, follow-up in our office on an as-needed basis  2.  Gait abnormality -Multifactorial, deconditioning, aging, cerebral  small vessel disease, lumbar stenosis -Encouraged moderate exercise  I spent 30 minutes of face-to-face and non-face-to-face time with patient.  This included previsit chart review, lab review, study review, order entry,  electronic health record documentation, patient education.  Butler Denmark, AGNP-C, DNP 03/27/2020, 7:50 AM [facility] [address], [place], [place] [address] [phone]

## 2020-03-27 ENCOUNTER — Encounter: Payer: Self-pay | Admitting: Neurology

## 2020-03-27 ENCOUNTER — Ambulatory Visit: Payer: Medicare Other | Admitting: Neurology

## 2020-03-27 ENCOUNTER — Other Ambulatory Visit: Payer: Self-pay

## 2020-03-27 VITALS — BP 145/72 | HR 84 | Temp 98.7°F | Ht 66.0 in | Wt 195.0 lb

## 2020-03-27 DIAGNOSIS — R413 Other amnesia: Secondary | ICD-10-CM | POA: Diagnosis not present

## 2020-03-27 DIAGNOSIS — R269 Unspecified abnormalities of gait and mobility: Secondary | ICD-10-CM | POA: Diagnosis not present

## 2020-03-27 MED ORDER — DONEPEZIL HCL 10 MG PO TABS: 10.0000 mg | ORAL_TABLET | Freq: Every day | ORAL | 4 refills | Status: DC

## 2020-03-27 MED ORDER — MEMANTINE HCL 10 MG PO TABS: 10.0000 mg | ORAL_TABLET | Freq: Two times a day (BID) | ORAL | 4 refills | Status: DC

## 2020-03-27 NOTE — Patient Instructions (Addendum)
It was great to meet you today! Continue current medications  Consider exercise, increase activity

## 2020-04-15 NOTE — Progress Notes (Signed)
I have reviewed and agreed above plan. 

## 2020-09-19 ENCOUNTER — Inpatient Hospital Stay (HOSPITAL_COMMUNITY)
Admission: EM | Admit: 2020-09-19 | Discharge: 2020-09-22 | DRG: 310 | Disposition: A | Discharge status: Home Health | Payer: Medicare Other | Attending: Family Medicine | Admitting: Family Medicine

## 2020-09-19 ENCOUNTER — Emergency Department (HOSPITAL_COMMUNITY): Payer: Medicare Other

## 2020-09-19 ENCOUNTER — Inpatient Hospital Stay (HOSPITAL_COMMUNITY): Payer: Medicare Other

## 2020-09-19 ENCOUNTER — Other Ambulatory Visit: Payer: Self-pay

## 2020-09-19 ENCOUNTER — Encounter (HOSPITAL_COMMUNITY): Payer: Self-pay

## 2020-09-19 DIAGNOSIS — Z83438 Family history of other disorder of lipoprotein metabolism and other lipidemia: Secondary | ICD-10-CM

## 2020-09-19 DIAGNOSIS — I639 Cerebral infarction, unspecified: Secondary | ICD-10-CM

## 2020-09-19 DIAGNOSIS — Z823 Family history of stroke: Secondary | ICD-10-CM

## 2020-09-19 DIAGNOSIS — G4700 Insomnia, unspecified: Secondary | ICD-10-CM | POA: Diagnosis present

## 2020-09-19 DIAGNOSIS — Z23 Encounter for immunization: Secondary | ICD-10-CM

## 2020-09-19 DIAGNOSIS — Z888 Allergy status to other drugs, medicaments and biological substances status: Secondary | ICD-10-CM | POA: Diagnosis not present

## 2020-09-19 DIAGNOSIS — I10 Essential (primary) hypertension: Secondary | ICD-10-CM | POA: Diagnosis present

## 2020-09-19 DIAGNOSIS — Z87891 Personal history of nicotine dependence: Secondary | ICD-10-CM

## 2020-09-19 DIAGNOSIS — R8271 Bacteriuria: Secondary | ICD-10-CM | POA: Diagnosis present

## 2020-09-19 DIAGNOSIS — Z20822 Contact with and (suspected) exposure to covid-19: Secondary | ICD-10-CM | POA: Diagnosis present

## 2020-09-19 DIAGNOSIS — Z8673 Personal history of transient ischemic attack (TIA), and cerebral infarction without residual deficits: Secondary | ICD-10-CM

## 2020-09-19 DIAGNOSIS — F0390 Unspecified dementia without behavioral disturbance: Secondary | ICD-10-CM | POA: Diagnosis present

## 2020-09-19 DIAGNOSIS — Z880 Allergy status to penicillin: Secondary | ICD-10-CM

## 2020-09-19 DIAGNOSIS — E119 Type 2 diabetes mellitus without complications: Secondary | ICD-10-CM | POA: Diagnosis present

## 2020-09-19 DIAGNOSIS — Z8249 Family history of ischemic heart disease and other diseases of the circulatory system: Secondary | ICD-10-CM | POA: Diagnosis not present

## 2020-09-19 DIAGNOSIS — E876 Hypokalemia: Secondary | ICD-10-CM | POA: Diagnosis present

## 2020-09-19 DIAGNOSIS — Z66 Do not resuscitate: Secondary | ICD-10-CM | POA: Diagnosis present

## 2020-09-19 DIAGNOSIS — Z79899 Other long term (current) drug therapy: Secondary | ICD-10-CM | POA: Diagnosis not present

## 2020-09-19 DIAGNOSIS — Y92009 Unspecified place in unspecified non-institutional (private) residence as the place of occurrence of the external cause: Secondary | ICD-10-CM | POA: Diagnosis not present

## 2020-09-19 DIAGNOSIS — I4891 Unspecified atrial fibrillation: Secondary | ICD-10-CM | POA: Diagnosis not present

## 2020-09-19 DIAGNOSIS — E785 Hyperlipidemia, unspecified: Secondary | ICD-10-CM | POA: Diagnosis present

## 2020-09-19 DIAGNOSIS — I63411 Cerebral infarction due to embolism of right middle cerebral artery: Secondary | ICD-10-CM

## 2020-09-19 DIAGNOSIS — W19XXXA Unspecified fall, initial encounter: Secondary | ICD-10-CM

## 2020-09-19 LAB — COMPREHENSIVE METABOLIC PANEL
ALT: 23 U/L (ref 0–44)
AST: 27 U/L (ref 15–41)
Albumin: 3.8 g/dL (ref 3.5–5.0)
Alkaline Phosphatase: 67 U/L (ref 38–126)
Anion gap: 13 (ref 5–15)
BUN: 28 mg/dL — ABNORMAL HIGH (ref 8–23)
CO2: 23 mmol/L (ref 22–32)
Calcium: 10.2 mg/dL (ref 8.9–10.3)
Chloride: 104 mmol/L (ref 98–111)
Creatinine, Ser: 0.84 mg/dL (ref 0.44–1.00)
GFR, Estimated: >60 mL/min (ref >60)
Glucose, Bld: 143 mg/dL — ABNORMAL HIGH (ref 70–99)
Potassium: 3.2 mmol/L — ABNORMAL LOW (ref 3.5–5.1)
Sodium: 140 mmol/L (ref 135–145)
Total Bilirubin: 1.3 mg/dL — ABNORMAL HIGH (ref 0.3–1.2)
Total Protein: 7.7 g/dL (ref 6.5–8.1)

## 2020-09-19 LAB — URINALYSIS, COMPLETE (UACMP) WITH MICROSCOPIC
Bilirubin Urine: NEGATIVE
Glucose, UA: NEGATIVE mg/dL
Hgb urine dipstick: NEGATIVE
Ketones, ur: 5 mg/dL — AB
Nitrite: NEGATIVE
Protein, ur: NEGATIVE mg/dL
Specific Gravity, Urine: 1.019 (ref 1.005–1.030)
pH: 5 (ref 5.0–8.0)

## 2020-09-19 LAB — APTT: aPTT: 29 seconds (ref 24–36)

## 2020-09-19 LAB — CBC WITH DIFFERENTIAL/PLATELET
Abs Immature Granulocytes: 0.08 K/uL — ABNORMAL HIGH (ref 0.00–0.07)
Basophils Absolute: 0 K/uL (ref 0.0–0.1)
Basophils Relative: 0 %
Eosinophils Absolute: 0 K/uL (ref 0.0–0.5)
Eosinophils Relative: 0 %
HCT: 48.3 % — ABNORMAL HIGH (ref 36.0–46.0)
Hemoglobin: 16.6 g/dL — ABNORMAL HIGH (ref 12.0–15.0)
Immature Granulocytes: 1 %
Lymphocytes Relative: 12 %
Lymphs Abs: 1.9 K/uL (ref 0.7–4.0)
MCH: 32.8 pg (ref 26.0–34.0)
MCHC: 34.4 g/dL (ref 30.0–36.0)
MCV: 95.5 fL (ref 80.0–100.0)
Monocytes Absolute: 1.7 K/uL — ABNORMAL HIGH (ref 0.1–1.0)
Monocytes Relative: 10 %
Neutro Abs: 12.8 K/uL — ABNORMAL HIGH (ref 1.7–7.7)
Neutrophils Relative %: 77 %
Platelets: 220 K/uL (ref 150–400)
RBC: 5.06 MIL/uL (ref 3.87–5.11)
RDW: 13 % (ref 11.5–15.5)
WBC: 16.5 K/uL — ABNORMAL HIGH (ref 4.0–10.5)
nRBC: 0 % (ref 0.0–0.2)

## 2020-09-19 LAB — RESPIRATORY PANEL BY RT PCR (FLU A&B, COVID)
Influenza A by PCR: NEGATIVE
Influenza B by PCR: NEGATIVE
SARS Coronavirus 2 by RT PCR: NEGATIVE

## 2020-09-19 LAB — TROPONIN I (HIGH SENSITIVITY)
Troponin I (High Sensitivity): 61 ng/L — ABNORMAL HIGH
Troponin I (High Sensitivity): 50 ng/L — ABNORMAL HIGH (ref <18)

## 2020-09-19 LAB — PROTIME-INR
INR: 1.1 (ref 0.8–1.2)
Prothrombin Time: 13.3 seconds (ref 11.4–15.2)

## 2020-09-19 LAB — MAGNESIUM: Magnesium: 2.4 mg/dL (ref 1.7–2.4)

## 2020-09-19 LAB — TSH: TSH: 0.687 u[IU]/mL (ref 0.350–4.500)

## 2020-09-19 LAB — LACTIC ACID, PLASMA
Lactic Acid, Venous: 1.4 mmol/L (ref 0.5–1.9)
Lactic Acid, Venous: 1.3 mmol/L (ref 0.5–1.9)

## 2020-09-19 MED ORDER — STROKE: EARLY STAGES OF RECOVERY BOOK
Freq: Once | Status: AC
  Filled 2020-09-19: qty 1

## 2020-09-19 MED ORDER — ASPIRIN 81 MG PO CHEW
324.0000 mg | CHEWABLE_TABLET | Freq: Once | ORAL | Status: AC
  Administered 2020-09-19: 324 mg via ORAL
  Filled 2020-09-19: qty 4

## 2020-09-19 MED ORDER — VALACYCLOVIR HCL 500 MG PO TABS
500.0000 mg | ORAL_TABLET | Freq: Every day | ORAL | Status: DC
  Administered 2020-09-20 – 2020-09-22 (×3): 500 mg via ORAL
  Filled 2020-09-19 (×3): qty 1

## 2020-09-19 MED ORDER — ACETAMINOPHEN 650 MG RE SUPP: 650.0000 mg | RECTAL | Status: DC | PRN

## 2020-09-19 MED ORDER — INFLUENZA VAC A&B SA ADJ QUAD 0.5 ML IM PRSY
0.5000 mL | PREFILLED_SYRINGE | INTRAMUSCULAR | Status: AC
  Administered 2020-09-22: 0.5 mL via INTRAMUSCULAR
  Filled 2020-09-19: qty 0.5

## 2020-09-19 MED ORDER — CALCIUM CARBONATE-VITAMIN D3 600-400 MG-UNIT PO TABS: ORAL_TABLET | Freq: Every day | ORAL | Status: DC

## 2020-09-19 MED ORDER — OMEGA 3 1200 MG PO CAPS: ORAL_CAPSULE | Freq: Every day | ORAL | Status: DC

## 2020-09-19 MED ORDER — ASPIRIN 325 MG PO TABS
325.0000 mg | ORAL_TABLET | Freq: Every day | ORAL | Status: DC
  Administered 2020-09-20: 325 mg via ORAL
  Filled 2020-09-19: qty 1

## 2020-09-19 MED ORDER — ACETAMINOPHEN 325 MG PO TABS: 650.0000 mg | ORAL_TABLET | ORAL | Status: DC | PRN

## 2020-09-19 MED ORDER — SENNOSIDES-DOCUSATE SODIUM 8.6-50 MG PO TABS: 1.0000 | ORAL_TABLET | Freq: Every evening | ORAL | Status: DC | PRN

## 2020-09-19 MED ORDER — ENOXAPARIN SODIUM 40 MG/0.4ML ~~LOC~~ SOLN
40.0000 mg | SUBCUTANEOUS | Status: DC
  Administered 2020-09-19: 40 mg via SUBCUTANEOUS
  Filled 2020-09-19: qty 0.4

## 2020-09-19 MED ORDER — DILTIAZEM HCL 30 MG PO TABS
30.0000 mg | ORAL_TABLET | Freq: Four times a day (QID) | ORAL | Status: DC
  Administered 2020-09-19 – 2020-09-20 (×5): 30 mg via ORAL
  Filled 2020-09-19 (×4): qty 1

## 2020-09-19 MED ORDER — ONDANSETRON HCL 4 MG/2ML IJ SOLN: 4.0000 mg | Freq: Four times a day (QID) | INTRAMUSCULAR | Status: DC | PRN

## 2020-09-19 MED ORDER — MAGNESIUM 250 MG PO TABS: 750.0000 mg | ORAL_TABLET | Freq: Every day | ORAL | Status: DC

## 2020-09-19 MED ORDER — ACETAMINOPHEN 160 MG/5ML PO SOLN: 650.0000 mg | ORAL | Status: DC | PRN

## 2020-09-19 MED ORDER — SODIUM CHLORIDE 0.9 % IV SOLN: INTRAVENOUS | Status: AC

## 2020-09-19 MED ORDER — DILTIAZEM HCL-DEXTROSE 125-5 MG/125ML-% IV SOLN (PREMIX)
5.0000 mg/h | INTRAVENOUS | Status: DC
  Administered 2020-09-19: 5 mg/h via INTRAVENOUS
  Filled 2020-09-19 (×2): qty 125

## 2020-09-19 MED ORDER — ROSUVASTATIN CALCIUM 5 MG PO TABS
10.0000 mg | ORAL_TABLET | Freq: Every day | ORAL | Status: DC
  Administered 2020-09-20 – 2020-09-22 (×3): 10 mg via ORAL
  Filled 2020-09-19 (×3): qty 2

## 2020-09-19 MED ORDER — MEMANTINE HCL 10 MG PO TABS
10.0000 mg | ORAL_TABLET | Freq: Two times a day (BID) | ORAL | Status: DC
  Administered 2020-09-19 – 2020-09-22 (×6): 10 mg via ORAL
  Filled 2020-09-19 (×6): qty 1

## 2020-09-19 MED ORDER — SODIUM CHLORIDE 0.9 % IV BOLUS
500.0000 mL | Freq: Once | INTRAVENOUS | Status: AC
  Administered 2020-09-19: 500 mL via INTRAVENOUS

## 2020-09-19 MED ORDER — POTASSIUM CHLORIDE CRYS ER 20 MEQ PO TBCR
40.0000 meq | EXTENDED_RELEASE_TABLET | Freq: Once | ORAL | Status: AC
  Administered 2020-09-19: 40 meq via ORAL
  Filled 2020-09-19: qty 2

## 2020-09-19 MED ORDER — DILTIAZEM HCL 25 MG/5ML IV SOLN
20.0000 mg | Freq: Once | INTRAVENOUS | Status: DC
  Filled 2020-09-19: qty 5

## 2020-09-19 MED ORDER — CO Q-10 100 MG PO CAPS: 100.0000 mg | ORAL_CAPSULE | Freq: Every day | ORAL | Status: DC

## 2020-09-19 MED ORDER — VITAMIN B-12 1000 MCG PO TABS
1000.0000 ug | ORAL_TABLET | Freq: Every day | ORAL | Status: DC
  Administered 2020-09-20 – 2020-09-22 (×3): 1000 ug via ORAL
  Filled 2020-09-19 (×3): qty 1

## 2020-09-19 NOTE — ED Notes (Signed)
PA aware of pts HR

## 2020-09-19 NOTE — ED Notes (Signed)
Report given to [facility]  

## 2020-09-19 NOTE — Consult Note (Signed)
Neurology Consultation Reason for Consult: Confusion Referring Physician: Grandville Silos, D  CC: Confusion  History is obtained from: Patient, chart  HPI: Jennifer Arroyo is a 79 y.o. female with a history of dementia who presents with increased confusion over the past few days, today she fell and for that reason was brought into the emergency department.  She has had similar presentations with urinary tract infections in the past, and appears to have evidence of a UTI versus colonization.  A head CT was performed which demonstrates a subacute stroke in the right parietal region.  She was also found to have new onset atrial fibrillation.   LKW: Unclear time of onset tpa given?: no, unclear time of onset  ROS: A 14 point ROS was performed and is negative except as noted in the HPI.  Past Medical History:  Diagnosis Date  . Bilateral bunions    hammer toe  . Cervical os stenosis   . Gallstones   . Hemangioma of liver    probable  . Hemorrhoids   . Hyperlipemia   . Hypertension   . Insomnia   . Left bundle branch block   . Left bundle branch block   . Low back pain   . Memory loss   . Plantar fasciitis   . Rectocele   . Renal cyst      Family History  Problem Relation Age of Onset  . Liver cancer Father   . Hypertension Brother   . Hyperlipidemia Brother   . Stroke Mother   . Rheumatic fever Mother   . Heart disease Mother      Social History:  reports that she has quit smoking. Her smoking use included cigarettes. She has never used smokeless tobacco. She reports that she does not drink alcohol and does not use drugs.   Exam: Current vital signs: BP 110/64 (BP Location: Left Arm)   Pulse 70   Temp 98.4 F (36.9 C) (Oral)   Resp 18   SpO2 95%  Vital signs in last 24 hours: Temp:  [97.9 F (36.6 C)-98.5 F (36.9 C)] 98.4 F (36.9 C) (10/15 2003) Pulse Rate:  [41-166] 70 (10/15 2003) Resp:  [16-24] 18 (10/15 1843) BP: (105-154)/(56-130) 110/64 (10/15  2003) SpO2:  [91 %-100 %] 95 % (10/15 2003)   Physical Exam  Constitutional: Appears well-developed and well-nourished.  Psych: Affect appropriate to situation Eyes: No scleral injection HENT: No OP obstrucion MSK: no joint deformities.  Cardiovascular: Normal rate and regular rhythm.  Respiratory: Effort normal, non-labored breathing GI: Soft.  No distension. There is no tenderness.  Skin: WDI  Neuro: Mental Status: Patient is awake, alert, she knows that she is in a hospital but not which one, guesses that she is in [place], unable to give me the month("is it summer?"), gives the year is 2020 No signs of aphasia or neglect Cranial Nerves: II: Though she is able to identify finger wiggling in all four visual fields, she has delayed responses in the left upper field pupils are equal, round, and reactive to light.   III,IV, VI: EOMI without ptosis or diploplia.  V: Facial sensation is symmetric to temperature VII: Facial movement with ?  Decreased left nasolabial fold.  Motor: She has proximal weakness in her left arm and cannot raise it, but has good strength distally.  Possible mild weakness in the left leg but does not drift. Sensory: Sensation is symmetric to light touch and temperature in the arms and legs.  She does not  extinguish to double simultaneous stimulation. Cerebellar: No clear ataxia  I have reviewed labs in epic and the results pertinent to this consultation are: CMP-hypokalemia at 3.2  I have reviewed the images obtained: CT head - subactue stroke in the righ tparietal region.   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  This is consistent with a stroke caused by the atrial fibrillation.  Onset was unclear, but with report of a few days of confusion, I suspect that the stroke is likely the etiology.  I would favor getting MRI prior to decisions about timing of starting  anticoagulation.  Recommendations: - HgbA1c, fasting lipid panel - MRI of the brain without contrast - Frequent neuro checks - Echocardiogram - Carotid dopplers - Prophylactic therapy-Antiplatelet med: Aspirin - dose 325mg  PO or 300mg  PR - Risk factor modification - Telemetry monitoring - PT consult, OT consult, Speech consult - Stroke team to follow  Roland Rack, MD [facility] [phone]  If 7pm- 7am, please page neurology on call as listed in [place].

## 2020-09-19 NOTE — ED Triage Notes (Signed)
Pt arrives from home via EMS. Per EMS: pt had a fall this am. Pt denies head injury or LOC. Pt is not on any blood thinners. Husband concerned for UTI due to odor in urine and increased confusion. Pt denies any urinary symptoms. Pt has dementia at baseline.

## 2020-09-19 NOTE — H&P (Signed)
History and Physical    Jennifer Arroyo UXL:[id number] DOB: 17-Apr-1941 DOA: 09/19/2020  PCP: Kathyrn Lass, MD  Patient coming from: Home  I have personally briefly reviewed patient's old medical records in [place]  Chief Complaint: Falls/confusion  HPI: Jennifer Arroyo is a 79 y.o. female with medical history significant of dementia/memory loss which has been gradual onset since February 2019, hypertension, hyperlipidemia, history of left bundle branch block who presented to the ED with falls.  Patient with dementia alert to self and place however unable to state why she is here.  History obtained from the husband who is at bedside and very supportive of his wife.  Per husband over the past few days patient has been a little bit of/confused from her usual baseline which has been ongoing for the past 5 days with associated falls.  Patient had a fall 5 days prior to admission as well as a fall the morning of admission when she fell from the bed.  Patient did not hit her head or lose consciousness.  Patient with no complaints of pain afterwards.  Patient denied any head injury.  Patient not on anticoagulation.  Patient per husband had some complaints of right rib pain with inspiration.  Patient has not complained of any fevers, no chills, no shortness of breath, no chest pain, no nausea, no vomiting, no diarrhea, no constipation, no dysuria, no urinary frequency, no syncope, no melena, no hematemesis, no hematochezia, no asymmetric weakness or numbness, no speech abnormalities, no difficulty swallowing, no palpitations.  Husband denies and does that patient has been a little bit slower and unsure with her gait/walking.  Per husband was told per EMS that patient's urine had a foul odor.  Patient noted to have been vaccinated against the Covid.  ED Course: Patient seen in the ED and on initial presentation was noted to be in A. fib with RVR with heart rates in the 160s and subsequently placed on a  Cardizem drip.  Initial set of troponin was 61.  Lactic acid level of 1.4.  Comprehensive metabolic profile with a potassium of 3.2, bilirubin of 1.3 otherwise was within normal limits.  Magnesium at 2.4.  CBC with a white count of 16.5, hemoglobin of 16.6, platelet count of 220 otherwise was within normal limits.  Influenza a and B and SARS coronavirus 2 PCR negative.  CT head with right parietal hypodensity concerning for subacute infarct, ill-defined focal hypodensity involving the left corona radiator may represent an age-indeterminate infarct versus chronic microvascular ischemic changes.  Moderate cerebral atrophy.  Chest x-ray negative for any acute infiltrate.  Urinalysis clear, many bacteria, 21-50 WBCs.  EKG with A. fib with RVR with heart rates of 164.  Patient placed on a Cardizem drip.  Patient given a full dose aspirin.  Hospitalist were called to admit the patient for further evaluation and management.  Review of Systems: As per HPI otherwise all other systems reviewed and are negative.  Past Medical History:  Diagnosis Date  . Bilateral bunions    hammer toe  . Cervical os stenosis   . Gallstones   . Hemangioma of liver    probable  . Hemorrhoids   . Hyperlipemia   . Hypertension   . Insomnia   . Left bundle branch block   . Left bundle branch block   . Low back pain   . Memory loss   . Plantar fasciitis   . Rectocele   . Renal cyst  Past Surgical History:  Procedure Laterality Date  . APPENDECTOMY    . COLONOSCOPY    . TONSILLECTOMY      Social History  reports that she has quit smoking. Her smoking use included cigarettes. She has never used smokeless tobacco. She reports that she does not drink alcohol and does not use drugs.  Allergies  Allergen Reactions  . Amoxicillin Nausea Only  . Ceclor [Cefaclor] Hives  . Levaquin [Levofloxacin] Other (See Comments)  . Nystatin Other (See Comments)    Black scale around teeth.    Family History  Problem  Relation Age of Onset  . Liver cancer Father   . Hypertension Brother   . Hyperlipidemia Brother   . Stroke Mother   . Rheumatic fever Mother   . Heart disease Mother    Father deceased age 72 from liver cancer.  Mother deceased with a history of strokes.  Mother died when patient was 56 years old.  Prior to Admission medications   Medication Sig Start Date End Date Taking? Authorizing Provider  BIOTIN PO Take 1 tablet by mouth daily.   Yes [provider]  Calcium Carb-Cholecalciferol (CALCIUM 600-D PO) Take 1 tablet by mouth daily.   Yes [provider]  Coenzyme Q10 (CO Q-10) 100 MG CAPS Take 100 mg by mouth daily.    Yes [provider]  Cyanocobalamin (VITAMIN B-12 PO) Take 1 tablet by mouth daily.   Yes [provider]  hydrochlorothiazide (HYDRODIURIL) 25 MG tablet Take 25 mg by mouth daily.   Yes [provider]  Magnesium 250 MG TABS Take 750 mg by mouth daily.   Yes [provider]  memantine (NAMENDA) 10 MG tablet Take 1 tablet (10 mg total) by mouth 2 (two) times daily. Please call [phone] to schedule appt. 03/27/20  Yes Suzzanne Cloud, NP  metoprolol succinate (TOPROL-XL) 25 MG 24 hr tablet Take 25 mg by mouth daily.   Yes [provider]  Omega-3 Fatty Acids (OMEGA 3 PO) Take 1,200 mg by mouth daily.   Yes [provider]  rosuvastatin (CRESTOR) 10 MG tablet Take 10 mg by mouth daily.   Yes [provider]  valACYclovir (VALTREX) 500 MG tablet Take 500 mg by mouth daily.  07/28/20  Yes [provider]  donepezil (ARICEPT) 10 MG tablet Take 1 tablet (10 mg total) by mouth at bedtime. Patient not taking: Reported on 09/19/2020 03/27/20   Suzzanne Cloud, NP    Physical Exam: Vitals:   09/19/20 1530 09/19/20 1545 09/19/20 1615 09/19/20 1630  BP:  (!) 109/56 122/76 105/64  Pulse: 76 75 78 (!) 41  Resp:  17    Temp:      TempSrc:      SpO2: 94% 95% 96% 95%    Constitutional: NAD,  calm, comfortable Vitals:   09/19/20 1530 09/19/20 1545 09/19/20 1615 09/19/20 1630  BP:  (!) 109/56 122/76 105/64  Pulse: 76 75 78 (!) 41  Resp:  17    Temp:      TempSrc:      SpO2: 94% 95% 96% 95%   Eyes: PERRL, lids and conjunctivae normal ENMT: Mucous membranes are moist. Posterior pharynx clear of any exudate or lesions.Normal dentition.  Neck: normal, supple, no masses, no thyromegaly Respiratory: clear to auscultation bilaterally, no wheezing, no crackles. Normal respiratory effort. No accessory muscle use.  Cardiovascular: Irregularly irregular, no murmurs / rubs / gallops. No extremity edema. 2+ pedal pulses. No carotid bruits.  Abdomen: no tenderness, no masses palpated. No hepatosplenomegaly. Bowel sounds positive.  Musculoskeletal: no clubbing / cyanosis. No joint deformity upper and lower extremities. Good ROM, no contractures. Normal muscle tone.  Skin: no rashes, lesions, ulcers. No induration Neurologic: CN 2-12 grossly intact. Sensation intact, DTR normal. Strength 5/5 in all 4.  No visual deficits.  Gait not tested secondary to safety. Psychiatric: Normal judgment and poor to fair insight. Alert and oriented x 3. Normal mood.   Labs on Admission: I have personally reviewed following labs and imaging studies  CBC: Recent Labs  Lab 09/19/20 1200  WBC 16.5*  NEUTROABS 12.8*  HGB 16.6*  HCT 48.3*  MCV 95.5  PLT 867    Basic Metabolic Panel: Recent Labs  Lab 09/19/20 1200  NA 140  K 3.2*  CL 104  CO2 23  GLUCOSE 143*  BUN 28*  CREATININE 0.84  CALCIUM 10.2  MG 2.4    GFR: CrCl cannot be calculated (Unknown ideal weight.).  Liver Function Tests: Recent Labs  Lab 09/19/20 1200  AST 27  ALT 23  ALKPHOS 67  BILITOT 1.3*  PROT 7.7  ALBUMIN 3.8    Urine analysis:    Component Value Date/Time   COLORURINE YELLOW 09/19/2020 1200   APPEARANCEUR CLEAR 09/19/2020 1200   LABSPEC 1.019 09/19/2020 1200   PHURINE 5.0 09/19/2020 1200   GLUCOSEU  NEGATIVE 09/19/2020 1200   HGBUR NEGATIVE 09/19/2020 1200   BILIRUBINUR NEGATIVE 09/19/2020 1200   KETONESUR 5 (A) 09/19/2020 1200   PROTEINUR NEGATIVE 09/19/2020 1200   NITRITE NEGATIVE 09/19/2020 1200   LEUKOCYTESUR TRACE (A) 09/19/2020 1200    Radiological Exams on Admission: CT Head Wo Contrast  Addendum Date: 09/19/2020   ADDENDUM REPORT: 09/19/2020 14:36 ADDENDUM: These results were called by telephone at the time of interpretation on 09/19/2020 at 2:26 pm to provider [facility] PATEL , who verbally acknowledged these results. Electronically Signed   By: Primitivo Gauze M.D.   On: 09/19/2020 14:36   Result Date: 09/19/2020 CLINICAL DATA:  Neuro deficit, stroke suspected. EXAM: CT HEAD WITHOUT CONTRAST TECHNIQUE: Contiguous axial images were obtained from the base of the skull through the vertex without intravenous contrast. COMPARISON:  01/26/2019 MRI head. FINDINGS: Brain: New hypodensity involving the right parietal lobe with extension to the cortex is concerning for subacute infarct. Diffuse parenchymal volume loss with ex vacuo dilatation. Ill-defined focal hypodensity involving the left corona radiata. No mass lesion. No midline shift, ventriculomegaly or extra-axial fluid collection. Vascular: No hyperdense vessel or unexpected calcification. Bilateral skull base atherosclerotic calcifications. Skull: Negative for fracture or focal lesion. Sinuses/Orbits: Normal orbits. Clear paranasal sinuses. No mastoid effusion. Other: None. IMPRESSION: 1. Right parietal hypodensity is concerning for subacute infarct. 2. Ill-defined focal hypodensity involving the left corona radiata may represent an age-indeterminate infarct versus chronic microvascular ischemic changes. 3. Moderate cerebral atrophy. Electronically Signed: By: Primitivo Gauze M.D. On: 09/19/2020 14:23   DG Chest Port 1 View  Result Date: 09/19/2020 CLINICAL DATA:  Fall.  Short of breath. EXAM: PORTABLE CHEST 1 VIEW  COMPARISON:  None. FINDINGS: Elevated right hemidiaphragm with right lower lobe patchy airspace disease likely atelectasis. Left lung clear. Heart size mildly enlarged. Negative for heart failure or edema. No significant pleural fluid. IMPRESSION: Elevated right hemidiaphragm with right lower lobe atelectasis. Electronically Signed   By: Franchot Gallo M.D.   On: 09/19/2020 11:59    EKG: Independently reviewed.  A. fib with RVR heart rate of 164.  Assessment/Plan Principal Problem:  CVA (cerebral vascular accident) [facility]) Active Problems:   New onset a-fib ([place])   HTN (hypertension)   Hyperlipidemia   Dementia without behavioral disturbance ([place])   Fall at home, initial encounter   Bacteria in urine    1  Subacute versus acute CVA Patient presented with increased falls, slight change in mental status from baseline with underlying history of dementia.  Head CT which was done concerning for subacute CVA.  Concern for embolic CVA.  Patient also noted on presentation to be in A. fib with RVR which improved on Cardizem drip.  Heart rate better controlled.  Patient with no prior history of A. fib with a history of hypertension hyperlipidemia.  Will admit the patient to [facility] for stroke work-up.  Check MRI brain, MRA head and neck, carotid Dopplers, 2D echo, fasting lipid panel, hemoglobin A1c, TSH.  Placed patient on aspirin 325 mg daily.  Patient with acute CVA, new onset A. fib, due to concern for hemorrhagic conversion we will hold off on anticoagulation for now until patient has been assessed by neurology.  ED PA spoke with neurologist Dr.Khaliqdina who recommended admission to [facility] for stroke work-up.  Continue home regimen statin, permissive hypertension.  Follow.  2.  New onset A. Fib( CHA2DS2VASC score > 2) Patient needed to be A. fib with RVR on presentation with heart rates in the 160s and placed on a Cardizem drip.  Heart rate better controlled.  Check a TSH.  Cycle  cardiac enzymes.  Check a 2D echo.  Will place on oral Cardizem 30 mg every 6 hours and subsequently discontinue Cardizem drip 2 hours after oral Cardizem has been resumed.  Patient with acute/subacute CVA due to concern for hemorrhagic conversion will hold off on anticoagulation at this time until patient has been assessed by neurology and cardiology.  Full dose aspirin.  Consult with cardiology for further evaluation and management.  3.  Hypertension Permissive hypertension secondary to problem #1.  Hold home regimen Toprol-XL, HCTZ.  Patient on oral Cardizem drip secondary to new onset A. fib.  Follow.  4.  Hyperlipidemia Check a fasting lipid panel.  Continue omega-3, statin.  Goal LDL less than 70.  5.  Dementia/memory loss Resume home regimen Namenda.  It is noted that patient not taking Aricept.  Neurology consultation pending.  6.  Bacteria in urine Urinalysis concerning for bacteria in urine.  Patient with no urinary symptoms.  Urine cultures pending.  Hold off on antibiotics at this time.   DVT prophylaxis: Lovenox Code Status:   DNR Family Communication:  Updated patient and husband who was at bedside.  Husband provided most of the history. Disposition Plan:   Patient is from:  Home  Anticipated DC to:  Home with home health versus SNF  Anticipated DC date:  Hopefully in 3 to 4 days/09/22/2020  Anticipated DC barriers:   Consults called:  Neuro hospitalist/cardiology Admission status:  Admit to [facility]  Severity of Illness:     Irine Seal MD [facility]  How to contact the [facility] Attending or Consulting provider [place] or covering provider during after hours 7P -7A, for this patient?   1. Check the care team in [facility] and look for a) attending/consulting [facility] provider listed and b) the [facility] team listed 2. Log into [email or web address] and use [place]'s universal password to access. If you do not have the password, please contact the hospital  operator. 3. Locate the [facility] provider you are looking  for under [facility] and page to a number that you can be directly reached. 4. If you still have difficulty reaching the provider, please page the [facility] (Director on Call) for the Hospitalists listed on amion for assistance.  09/19/2020, 4:46 PM

## 2020-09-19 NOTE — ED Provider Notes (Signed)
Medical screening examination/treatment/procedure(s) were conducted as a shared visit with non-physician practitioner(s) and myself.  I personally evaluated the patient during the encounter.  EKG Interpretation  Date/Time:  Friday September 19 2020 12:11:36 EDT Ventricular Rate:  164 PR Interval:    QRS Duration: 119 QT Interval:  309 QTC Calculation: 516 R Axis:   36 Text Interpretation: Atrial fibrillation with rapid V-rate Incomplete left bundle branch block Low voltage, extremity leads Consider anterior infarct Artifact in lead(s) I II aVR aVL aVF V1 No old tracing to compare Confirmed by Lacretia Leigh ([id number]) on 09/19/2020 1:13:43 PM  79 year old female who presents after a fall.  Patient has a history of dementia.  Was noted to be altered.  On exam here patient was in A. fib with RVR but this resolved spontaneously.  Patient was placed on diltiazem drip.  Due to patient's confusion and A. fib patient will require an MRI of her brain after head CT is performed.  She will require admission   Lacretia Leigh, MD 09/19/20 1305

## 2020-09-19 NOTE — Progress Notes (Signed)
Pt arrival from [facility] via transport. Cardizem gtt infusing at 5 ml/hr with NS at 75 ml/hr. Pt was settled into bed with bed alarms on for oncoming shift. Husband at bedside about 20 minutes after pt's arrival.

## 2020-09-19 NOTE — ED Provider Notes (Signed)
[facility] DEPT Provider Note   CSN: [id number] Arrival date & time: 09/19/20  1055     History Chief Complaint  Patient presents with  . Fall  . Altered Mental Status    Jennifer Arroyo is a 79 y.o. female with past medical history of advanced dementia, hypertension, hyperlipidemia that presents with husband for fall via EMS.  Patient is demented, unable to tell me what happened or why she is here.  Husband at bedside able to tell me story.  Husband said that this morning she fell from bed to the floor, did not hit anything. No LOC.  She was not complaining of any pain afterward.  Denies any head injury or anticoagulation use.  Husband states that couple days before she also fell, did not hit anything at that time either.  States that he brought her in because she has been more confused for the past couple of days, was hard to orient this morning which is off of her baseline.  States that he thinks this is due to UTI due to odor in urine.  Has been taking medications as prescribed.  Has been complaining of anything.  No chest pain, shortness of breath, nausea, vomiting, fevers, chills. No history of a fib. Denies any sick contacts.  Has been vaccinated against Covid. Pt is level 5 caveat due to dementia.  HPI     Past Medical History:  Diagnosis Date  . Bilateral bunions    hammer toe  . Cervical os stenosis   . Gallstones   . Hemangioma of liver    probable  . Hemorrhoids   . Hyperlipemia   . Hypertension   . Insomnia   . Left bundle branch block   . Left bundle branch block   . Low back pain   . Memory loss   . Plantar fasciitis   . Rectocele   . Renal cyst     Patient Active Problem List   Diagnosis Date Noted  . CVA (cerebral vascular accident) ([place]) 09/19/2020  . Memory loss 01/11/2019  . Gait abnormality 01/11/2019    Past Surgical History:  Procedure Laterality Date  . APPENDECTOMY    . COLONOSCOPY    . TONSILLECTOMY        OB History   No obstetric history on file.     Family History  Problem Relation Age of Onset  . Liver cancer Father   . Hypertension Brother   . Hyperlipidemia Brother   . Stroke Mother   . Rheumatic fever Mother   . Heart disease Mother     Social History   Tobacco Use  . Smoking status: Former Smoker    Types: Cigarettes  . Smokeless tobacco: Never Used  . Tobacco comment: quit at age 52, 1 PPD x 10 year history  Vaping Use  . Vaping Use: Never used  Substance Use Topics  . Alcohol use: Never    Comment: rare  . Drug use: Never    Home Medications Prior to Admission medications   Medication Sig Start Date End Date Taking? Authorizing Provider  BIOTIN PO Take 1 tablet by mouth daily.   Yes [provider]  Calcium Carb-Cholecalciferol (CALCIUM 600-D PO) Take 1 tablet by mouth daily.   Yes [provider]  Coenzyme Q10 (CO Q-10) 100 MG CAPS Take 100 mg by mouth daily.    Yes [provider]  Cyanocobalamin (VITAMIN B-12 PO) Take 1 tablet by mouth daily.  Yes [provider]  hydrochlorothiazide (HYDRODIURIL) 25 MG tablet Take 25 mg by mouth daily.   Yes [provider]  Magnesium 250 MG TABS Take 750 mg by mouth daily.   Yes [provider]  memantine (NAMENDA) 10 MG tablet Take 1 tablet (10 mg total) by mouth 2 (two) times daily. Please call [phone] to schedule appt. 03/27/20  Yes Suzzanne Cloud, NP  metoprolol succinate (TOPROL-XL) 25 MG 24 hr tablet Take 25 mg by mouth daily.   Yes [provider]  Omega-3 Fatty Acids (OMEGA 3 PO) Take 1,200 mg by mouth daily.   Yes [provider]  rosuvastatin (CRESTOR) 10 MG tablet Take 10 mg by mouth daily.   Yes [provider]  valACYclovir (VALTREX) 500 MG tablet Take 500 mg by mouth daily.  07/28/20  Yes [provider]  donepezil (ARICEPT) 10 MG tablet Take 1 tablet (10 mg total) by mouth at bedtime. Patient not taking: Reported on  09/19/2020 03/27/20   Suzzanne Cloud, NP    Allergies    Amoxicillin, Ceclor [cefaclor], Levaquin [levofloxacin], and Nystatin  Review of Systems   Review of Systems  Unable to perform ROS: Dementia    Physical Exam Updated Vital Signs BP (!) 109/56   Pulse 75   Temp 98.5 F (36.9 C) (Rectal)   Resp 17   SpO2 95%   Physical Exam Constitutional:      General: She is not in acute distress.    Appearance: Normal appearance. She is not ill-appearing, toxic-appearing or diaphoretic.  HENT:     Head: Normocephalic and atraumatic.     Mouth/Throat:     Mouth: Mucous membranes are moist.     Pharynx: Oropharynx is clear.  Eyes:     General: No scleral icterus.    Extraocular Movements: Extraocular movements intact.     Pupils: Pupils are equal, round, and reactive to light.  Cardiovascular:     Rate and Rhythm: Normal rate and regular rhythm.     Pulses: Normal pulses.     Heart sounds: Normal heart sounds.  Pulmonary:     Effort: Pulmonary effort is normal. No respiratory distress.     Breath sounds: Normal breath sounds. No stridor. No wheezing, rhonchi or rales.  Chest:     Chest wall: No tenderness.  Abdominal:     General: Abdomen is flat. There is no distension.     Palpations: Abdomen is soft.     Tenderness: There is no abdominal tenderness. There is no guarding or rebound.  Musculoskeletal:        General: No swelling or tenderness. Normal range of motion.     Cervical back: Normal range of motion and neck supple. No rigidity.     Right lower leg: No edema.     Left lower leg: No edema.     Comments: No cervical, thoracic or lumbar midline tenderness.  No step-offs.  No lesions noted.  Normal range of motion to bilateral lower abdomen upper extremities, normal sensation.  Radial pulse and PT pulse bilaterally 2+.  Skin:    General: Skin is warm and dry.     Capillary Refill: Capillary refill takes less than 2 seconds.     Coloration: Skin is not pale.    Neurological:     General: No focal deficit present.     Mental Status: She is alert and oriented to person, place, and time.     Comments: Patient is alert and  oriented to self and place, unable to tell me why she is here. Clear speech. No facial droop. CNIII-XII grossly intact. Bilateral upper and lower extremities' sensation grossly intact. 5/5 symmetric strength with grip strength and with plantar and dorsi flexion bilaterally. Normal finger to nose bilaterally. Negative pronator drift.    Psychiatric:        Mood and Affect: Mood normal.        Behavior: Behavior normal.     ED Results / Procedures / Treatments   Labs (all labs ordered are listed, but only abnormal results are displayed) Labs Reviewed  COMPREHENSIVE METABOLIC PANEL - Abnormal; Notable for the following components:      Result Value   Potassium 3.2 (*)    Glucose, Bld 143 (*)    BUN 28 (*)    Total Bilirubin 1.3 (*)    All other components within normal limits  CBC WITH DIFFERENTIAL/PLATELET - Abnormal; Notable for the following components:   WBC 16.5 (*)    Hemoglobin 16.6 (*)    HCT 48.3 (*)    Neutro Abs 12.8 (*)    Monocytes Absolute 1.7 (*)    Abs Immature Granulocytes 0.08 (*)    All other components within normal limits  URINALYSIS, COMPLETE (UACMP) WITH MICROSCOPIC - Abnormal; Notable for the following components:   Ketones, ur 5 (*)    Leukocytes,Ua TRACE (*)    Bacteria, UA MANY (*)    All other components within normal limits  TROPONIN I (HIGH SENSITIVITY) - Abnormal; Notable for the following components:   Troponin I (High Sensitivity) 61 (*)    All other components within normal limits  RESPIRATORY PANEL BY RT PCR (FLU A&B, COVID)  URINE CULTURE  CULTURE, BLOOD (ROUTINE X 2)  CULTURE, BLOOD (ROUTINE X 2)  LACTIC ACID, PLASMA  MAGNESIUM  TSH  LACTIC ACID, PLASMA  PROTIME-INR  APTT  CBG MONITORING, ED  TROPONIN I (HIGH SENSITIVITY)    EKG EKG Interpretation  Date/Time:  Friday  September 19 2020 12:11:36 EDT Ventricular Rate:  164 PR Interval:    QRS Duration: 119 QT Interval:  309 QTC Calculation: 516 R Axis:   36 Text Interpretation: Atrial fibrillation with rapid V-rate Incomplete left bundle branch block Low voltage, extremity leads Consider anterior infarct Artifact in lead(s) I II aVR aVL aVF V1 No old tracing to compare Confirmed by Lacretia Leigh ([id number]) on 09/19/2020 1:03:33 PM   Radiology CT Head Wo Contrast  Addendum Date: 09/19/2020   ADDENDUM REPORT: 09/19/2020 14:36 ADDENDUM: These results were called by telephone at the time of interpretation on 09/19/2020 at 2:26 pm to provider [facility] Deondrae Mcgrail , who verbally acknowledged these results. Electronically Signed   By: Primitivo Gauze M.D.   On: 09/19/2020 14:36   Result Date: 09/19/2020 CLINICAL DATA:  Neuro deficit, stroke suspected. EXAM: CT HEAD WITHOUT CONTRAST TECHNIQUE: Contiguous axial images were obtained from the base of the skull through the vertex without intravenous contrast. COMPARISON:  01/26/2019 MRI head. FINDINGS: Brain: New hypodensity involving the right parietal lobe with extension to the cortex is concerning for subacute infarct. Diffuse parenchymal volume loss with ex vacuo dilatation. Ill-defined focal hypodensity involving the left corona radiata. No mass lesion. No midline shift, ventriculomegaly or extra-axial fluid collection. Vascular: No hyperdense vessel or unexpected calcification. Bilateral skull base atherosclerotic calcifications. Skull: Negative for fracture or focal lesion. Sinuses/Orbits: Normal orbits. Clear paranasal sinuses. No mastoid effusion. Other: None. IMPRESSION: 1. Right parietal hypodensity is concerning for subacute infarct. 2. Ill-defined  focal hypodensity involving the left corona radiata may represent an age-indeterminate infarct versus chronic microvascular ischemic changes. 3. Moderate cerebral atrophy. Electronically Signed: By: Primitivo Gauze M.D. On:  09/19/2020 14:23   DG Chest Port 1 View  Result Date: 09/19/2020 CLINICAL DATA:  Fall.  Short of breath. EXAM: PORTABLE CHEST 1 VIEW COMPARISON:  None. FINDINGS: Elevated right hemidiaphragm with right lower lobe patchy airspace disease likely atelectasis. Left lung clear. Heart size mildly enlarged. Negative for heart failure or edema. No significant pleural fluid. IMPRESSION: Elevated right hemidiaphragm with right lower lobe atelectasis. Electronically Signed   By: Franchot Gallo M.D.   On: 09/19/2020 11:59    Procedures .Critical Care Performed by: Alfredia Client, PA-C Authorized by: Alfredia Client, PA-C   Critical care provider statement:    Critical care time (minutes):  45   Critical care was time spent personally by me on the following activities:  Discussions with consultants, evaluation of patient's response to treatment, examination of patient, ordering and performing treatments and interventions, ordering and review of laboratory studies, ordering and review of radiographic studies, pulse oximetry, re-evaluation of patient's condition, obtaining history from patient or surrogate and review of old charts   (including critical care time)  Medications Ordered in ED Medications  diltiazem (CARDIZEM) 125 mg in dextrose 5% 125 mL (1 mg/mL) infusion (5 mg/hr Intravenous New Bag/Given 09/19/20 1325)  influenza vaccine adjuvanted (FLUAD) injection 0.5 mL (has no administration in time range)  potassium chloride SA (KLOR-CON) CR tablet 40 mEq (has no administration in time range)  aspirin chewable tablet 324 mg (324 mg Oral Given 09/19/20 1548)    ED Course  I have reviewed the triage vital signs and the nursing notes.  Pertinent labs & imaging results that were available during my care of the patient were reviewed by me and considered in my medical decision making (see chart for details).    MDM Rules/Calculators/A&P                         Jennifer Arroyo is a 79 y.o. female with  past medical history of advanced dementia, hypertension, hyperlipidemia that presents with husband for fall via EMS.  Patient with benign exam, normal neuro exam, is alert to self and place.  Blood work pending.  Pt in NSR when arriving to ED,  Upon reevaluation patient was in A. fib with RVR, this is new for her.  Not on anticoagulation. She converted out of this with normal rate after a couple minutes, will prophylactically start dilt drip at this time.  Blood pressure 109/83.  Still alert and oriented to self.     Work-up today with urinalysis with trace leukocytes, could be UTI. Will obtain urine culture. CBC remarkable for leukocytosis of 16.5, CMP remarkable for potassium of 3.2.  Covid swab negative.  Lactic acid 1.4.   CT head does show right parietal subacute infarct, this does make sense since patient did become off baseline couple days ago.  No focal neuro findings on exam, will speak to neurology at this time and order MRI.  Upon reassessment patient still normal sinus rhythm with normal rate.  Blood pressure normotensive. Repeat neuro exam normal.  310 Spoke to neurology who recommends aspirin, does not recommend heparin.  Does recommend transfer over to [facility] for further stroke evaluation.  They will consult.  Will consult hospitalist at this time.   Patient passed stroke swallow screen, will give aspirin and replete potassium  at this time.  330 spoke to Dr. Grandville Silos, [facility] hospitalist who will admit to [place].  The patient appears reasonably stabilized for admission considering the current resources, flow, and capabilities available in the ED at this time, and I doubt any other [facility] requiring further screening and/or treatment in the ED prior to admission.  I discussed this case with my attending physician who cosigned this note including patient's presenting symptoms, physical exam, and planned diagnostics and interventions. Attending physician stated agreement with plan or made  changes to plan which were implemented.   Attending physician assessed patient at bedside.  Final Clinical Impression(s) / ED Diagnoses Final diagnoses:  Atrial fibrillation with RVR ([place])  Cerebrovascular accident (CVA), unspecified mechanism [facility])    Rx / [place] Orders ED Discharge Orders    None       Alfredia Client, PA-C 09/19/20 1604    Lacretia Leigh, MD 09/25/20 1145

## 2020-09-19 NOTE — ED Notes (Signed)
Attempted to call report to [facility]- Per RN- pt unable to go to assigned room. RN to call back when new room is assigned.

## 2020-09-20 ENCOUNTER — Inpatient Hospital Stay (HOSPITAL_COMMUNITY): Payer: Medicare Other

## 2020-09-20 DIAGNOSIS — I639 Cerebral infarction, unspecified: Secondary | ICD-10-CM

## 2020-09-20 DIAGNOSIS — I4891 Unspecified atrial fibrillation: Secondary | ICD-10-CM | POA: Diagnosis not present

## 2020-09-20 LAB — CBC WITH DIFFERENTIAL/PLATELET
Abs Immature Granulocytes: 0.04 10*3/uL (ref 0.00–0.07)
Basophils Absolute: 0 10*3/uL (ref 0.0–0.1)
Basophils Relative: 0 %
Eosinophils Absolute: 0 10*3/uL (ref 0.0–0.5)
Eosinophils Relative: 0 %
HCT: 43.3 % (ref 36.0–46.0)
Hemoglobin: 14.5 g/dL (ref 12.0–15.0)
Immature Granulocytes: 0 %
Lymphocytes Relative: 16 %
Lymphs Abs: 1.9 10*3/uL (ref 0.7–4.0)
MCH: 32.5 pg (ref 26.0–34.0)
MCHC: 33.5 g/dL (ref 30.0–36.0)
MCV: 97.1 fL (ref 80.0–100.0)
Monocytes Absolute: 1.3 10*3/uL — ABNORMAL HIGH (ref 0.1–1.0)
Monocytes Relative: 10 %
Neutro Abs: 9 10*3/uL — ABNORMAL HIGH (ref 1.7–7.7)
Neutrophils Relative %: 74 %
Platelets: 191 10*3/uL (ref 150–400)
RBC: 4.46 MIL/uL (ref 3.87–5.11)
RDW: 13 % (ref 11.5–15.5)
WBC: 12.2 10*3/uL — ABNORMAL HIGH (ref 4.0–10.5)
nRBC: 0 % (ref 0.0–0.2)

## 2020-09-20 LAB — ECHOCARDIOGRAM COMPLETE
AR max vel: 2.76 cm2
AV Area VTI: 2.8 cm2
AV Area mean vel: 2.71 cm2
AV Mean grad: 12 mmHg
AV Peak grad: 21.5 mmHg
Ao pk vel: 2.32 m/s
S' Lateral: 2.6 cm

## 2020-09-20 LAB — BASIC METABOLIC PANEL
Anion gap: 10 (ref 5–15)
BUN: 22 mg/dL (ref 8–23)
CO2: 23 mmol/L (ref 22–32)
Calcium: 9.4 mg/dL (ref 8.9–10.3)
Chloride: 107 mmol/L (ref 98–111)
Creatinine, Ser: 0.92 mg/dL (ref 0.44–1.00)
GFR, Estimated: 59 mL/min — ABNORMAL LOW (ref >60)
Glucose, Bld: 105 mg/dL — ABNORMAL HIGH (ref 70–99)
Potassium: 4.2 mmol/L (ref 3.5–5.1)
Sodium: 140 mmol/L (ref 135–145)

## 2020-09-20 LAB — LIPID PANEL
Cholesterol: 135 mg/dL (ref 0–200)
HDL: 46 mg/dL (ref >40)
LDL Cholesterol: 71 mg/dL (ref 0–99)
Total CHOL/HDL Ratio: 2.9 RATIO
Triglycerides: 88 mg/dL (ref <150)
VLDL: 18 mg/dL (ref 0–40)

## 2020-09-20 LAB — MAGNESIUM: Magnesium: 2 mg/dL (ref 1.7–2.4)

## 2020-09-20 MED ORDER — PERFLUTREN LIPID MICROSPHERE
1.0000 mL | INTRAVENOUS | Status: AC | PRN
  Administered 2020-09-20: 3 mL via INTRAVENOUS
  Filled 2020-09-20: qty 10

## 2020-09-20 MED ORDER — DILTIAZEM HCL-DEXTROSE 125-5 MG/125ML-% IV SOLN (PREMIX)
5.0000 mg/h | INTRAVENOUS | Status: DC
  Administered 2020-09-20: 12.5 mg/h via INTRAVENOUS
  Administered 2020-09-20: 5 mg/h via INTRAVENOUS
  Administered 2020-09-20 – 2020-09-21 (×2): 15 mg/h via INTRAVENOUS
  Filled 2020-09-20 (×3): qty 125

## 2020-09-20 MED ORDER — APIXABAN 5 MG PO TABS
5.0000 mg | ORAL_TABLET | Freq: Two times a day (BID) | ORAL | Status: DC
  Administered 2020-09-20 – 2020-09-22 (×4): 5 mg via ORAL
  Filled 2020-09-20 (×4): qty 1

## 2020-09-20 NOTE — Consult Note (Signed)
Cardiology Consult   Patient ID: Jennifer Arroyo MRN: [id number]; DOB: February 24, 1941   Admission date: 09/19/2020  Primary Care Provider: Kathyrn Lass, MD [facility] Cardiologist: No primary care provider on file.  [place] Electrophysiologist:  None   Chief Complaint:  Afib with RVR  Patient Profile:   Jennifer Arroyo is a 79 y.o. female with HTN, HLD, and dementia who presented to ED with several falls found to have new Afib with RVR with HR 160s for which cardiology has been consulted by Dr. Doristine Bosworth.  History of Present Illness:   Jennifer Arroyo is a 79 y.o. female with HTN, HLD, and dementia who presented to ED with several falls found to have new Afib with RVR with HR 160s for which cardiology has been consulted.  Patient was brought to the hospital by her husband after experiencing several falls at home. On admission to the ED, she was noted to be in new Afib with RVR with HR 160s. She was initiated on a dilt gtt as well as apixaban for anticoagulation with improvement. Now back in NSR today.  Per the patient and her husband, patient has no known history of Afib. She has not had any palpitations, chest pain, SOB or LE edema. The patient does not recall the sensation of palpitations when she was out of rhythm in the ED. No known history of CVD disease. No bleeding issues or history of bleeds.   There was initial concern for subacute stroke based on CT head in the ED, however, MRI notable demonstrated chronic changes and therefore the patient was cleared to start [facility].   Currently, the patient is feeling better. Back in NSR. No chest pain or SOB. TSH 0.687, troponin 50  Past Medical History:  Diagnosis Date  . Bilateral bunions    hammer toe  . Cervical os stenosis   . Gallstones   . Hemangioma of liver    probable  . Hemorrhoids   . Hyperlipemia   . Hypertension   . Insomnia   . Left bundle branch block   . Left bundle branch block   . Low back pain   . Memory  loss   . Plantar fasciitis   . Rectocele   . Renal cyst     Past Surgical History:  Procedure Laterality Date  . APPENDECTOMY    . COLONOSCOPY    . TONSILLECTOMY       Medications Prior to Admission: Prior to Admission medications   Medication Sig Start Date End Date Taking? Authorizing Provider  BIOTIN PO Take 1 tablet by mouth daily.   Yes [provider]  Calcium Carb-Cholecalciferol (CALCIUM 600-D PO) Take 1 tablet by mouth daily.   Yes [provider]  Coenzyme Q10 (CO Q-10) 100 MG CAPS Take 100 mg by mouth daily.    Yes [provider]  Cyanocobalamin (VITAMIN B-12 PO) Take 1 tablet by mouth daily.   Yes [provider]  hydrochlorothiazide (HYDRODIURIL) 25 MG tablet Take 25 mg by mouth daily.   Yes [provider]  Magnesium 250 MG TABS Take 750 mg by mouth daily.   Yes [provider]  memantine (NAMENDA) 10 MG tablet Take 1 tablet (10 mg total) by mouth 2 (two) times daily. Please call [phone] to schedule appt. 03/27/20  Yes Suzzanne Cloud, NP  metoprolol succinate (TOPROL-XL) 25 MG 24 hr tablet Take 25 mg by mouth daily.   Yes [provider]  Omega-3 Fatty Acids (OMEGA 3 PO)  Take 1,200 mg by mouth daily.   Yes [provider]  rosuvastatin (CRESTOR) 10 MG tablet Take 10 mg by mouth daily.   Yes [provider]  valACYclovir (VALTREX) 500 MG tablet Take 500 mg by mouth daily.  07/28/20  Yes [provider]  donepezil (ARICEPT) 10 MG tablet Take 1 tablet (10 mg total) by mouth at bedtime. Patient not taking: Reported on 09/19/2020 03/27/20   Suzzanne Cloud, NP     Allergies:    Allergies  Allergen Reactions  . Amoxicillin Nausea Only  . Ceclor [Cefaclor] Hives  . Levaquin [Levofloxacin] Other (See Comments)  . Nystatin Other (See Comments)    Black scale around teeth.    Social History:   Social History   Socioeconomic History  . Marital status: Married    Spouse name: Not on  file  . Number of children: 0  . Years of education: college  . Highest education level: Not on file  Occupational History  . Occupation: Retired Education officer, museum  Tobacco Use  . Smoking status: Former Smoker    Types: Cigarettes  . Smokeless tobacco: Never Used  . Tobacco comment: quit at age 62, 1 PPD x 10 year history  Vaping Use  . Vaping Use: Never used  Substance and Sexual Activity  . Alcohol use: Never    Comment: rare  . Drug use: Never  . Sexual activity: Not on file  Other Topics Concern  . Not on file  Social History Narrative   Lives at home with husband.   Right-handed.   2 cups caffeine daily.   Social Determinants of Health   Financial Resource Strain:   . Difficulty of Paying Living Expenses: Not on file  Food Insecurity:   . Worried About Charity fundraiser in the Last Year: Not on file  . Ran Out of Food in the Last Year: Not on file  Transportation Needs:   . Lack of Transportation (Medical): Not on file  . Lack of Transportation (Non-Medical): Not on file  Physical Activity:   . Days of Exercise per Week: Not on file  . Minutes of Exercise per Session: Not on file  Stress:   . Feeling of Stress : Not on file  Social Connections:   . Frequency of Communication with Friends and Family: Not on file  . Frequency of Social Gatherings with Friends and Family: Not on file  . Attends Religious Services: Not on file  . Active Member of Clubs or Organizations: Not on file  . Attends Archivist Meetings: Not on file  . Marital Status: Not on file  Intimate Partner Violence:   . Fear of Current or Ex-Partner: Not on file  . Emotionally Abused: Not on file  . Physically Abused: Not on file  . Sexually Abused: Not on file    Family History:   The patient's family history includes Heart disease in her mother; Hyperlipidemia in her brother; Hypertension in her brother; Liver cancer in her father; Rheumatic fever in her mother; Stroke in her mother.     ROS:  Please see the history of present illness.  The patient denies chest pain, chest pressure, dyspnea at rest or with exertion, palpitations, PND, orthopnea, or leg swelling. Denies cough, fever, chills. Denies nausea, vomiting. Denies syncope or presyncope. Denies dizziness or lightheadedness. Denies snoring.  Physical Exam/Data:   Vitals:   09/19/20 2321 09/20/20 0359 09/20/20 0829 09/20/20 1211  BP: 99/76 (!) 93/56 127/68 115/70  Pulse: 63 76 76 76  Resp: 17 17 20 20   Temp: 98 F (36.7 C) 97.9 F (36.6 C) 98.4 F (36.9 C) 98.1 F (36.7 C)  TempSrc:  Oral Oral Oral  SpO2: 98% 97% 98% 94%    Intake/Output Summary (Last 24 hours) at 09/20/2020 1546 Last data filed at 09/20/2020 0820 Gross per 24 hour  Intake 120 ml  Output --  Net 120 ml   Last 3 Weights 03/27/2020 01/11/2019  Weight (lbs) 195 lb 171 lb 8 oz  Weight (kg) 88.451 kg 77.792 kg     There is no height or weight on file to calculate BMI.  General:  Comfortable, sitting up in bed HEENT: normal Lymph: no adenopathy Neck: no JVD Endocrine:  No thryomegaly Vascular: No carotid bruits; FA pulses 2+ bilaterally without bruits  Cardiac:  normal S1, S2; RRR; no murmur  Lungs:  clear to auscultation bilaterally, no wheezing, rhonchi or rales  Abd: soft, nontender, no hepatomegaly  Ext: no edema Musculoskeletal:  No deformities, BUE and BLE strength normal and equal Skin: warm and dry  Neuro:  CNs 2-12 intact, no focal abnormalities noted   EKG:  The ECG that was done  was personally reviewed and demonstrates Afib with RVR with HR 164. LBBB.  Relevant CV Studies: TTE pending.  Carotid ultrasound 09/20/2020 Summary:  Right Carotid: Velocities in the right ICA are consistent with a 1-39%  stenosis.   Left Carotid: Velocities in the left ICA are consistent with a 1-39%  stenosis.   Laboratory Data:  High Sensitivity Troponin:   Recent Labs  Lab 09/19/20 1200 09/19/20 1540  TROPONINIHS 61* 50*       Chemistry Recent Labs  Lab 09/19/20 1200 09/20/20 0418  NA 140 140  K 3.2* 4.2  CL 104 107  CO2 23 23  GLUCOSE 143* 105*  BUN 28* 22  CREATININE 0.84 0.92  CALCIUM 10.2 9.4  GFRNONAA >60 59*  ANIONGAP 13 10    Recent Labs  Lab 09/19/20 1200  PROT 7.7  ALBUMIN 3.8  AST 27  ALT 23  ALKPHOS 67  BILITOT 1.3*   Hematology Recent Labs  Lab 09/19/20 1200 09/20/20 0418  WBC 16.5* 12.2*  RBC 5.06 4.46  HGB 16.6* 14.5  HCT 48.3* 43.3  MCV 95.5 97.1  MCH 32.8 32.5  MCHC 34.4 33.5  RDW 13.0 13.0  PLT 220 191   BNPNo results for input(s): BNP, PROBNP in the last 168 hours.  DDimer No results for input(s): DDIMER in the last 168 hours.   Radiology/Studies:  MR ANGIO HEAD WO CONTRAST  Result Date: 09/20/2020 CLINICAL DATA:  Dementia, fall and encephalopathy EXAM: MRI HEAD WITHOUT CONTRAST MRA HEAD WITHOUT CONTRAST TECHNIQUE: Multiplanar, multiecho pulse sequences of the brain and surrounding structures were obtained without intravenous contrast. Angiographic images of the head were obtained using MRA technique without contrast. COMPARISON:  None. 01/26/2019 FINDINGS: MRI HEAD FINDINGS Brain: No acute infarct, acute hemorrhage or extra-axial collection. Multifocal hyperintense T2-weighted signal within the white matter. Old right parietal infarct. Generalized volume loss without a clear lobar predilection. Hemosiderin deposition at the old right parietal lobe insult. Normal midline structures. Vascular: Normal flow voids. Skull and upper cervical spine: Normal marrow signal. Sinuses/Orbits: Negative. Other: None. MRA HEAD FINDINGS POSTERIOR CIRCULATION: --Vertebral arteries: Normal V4 segments. --Inferior cerebellar arteries: Normal. --Basilar artery: Normal. --Superior cerebellar arteries: Normal. --Posterior cerebral arteries: Normal. The left PCA is predominantly supplied by the posterior communicating artery. ANTERIOR CIRCULATION: --Intracranial internal carotid  arteries:  Normal. --Anterior cerebral arteries (ACA): Normal. Both A1 segments are present. Patent anterior communicating artery (a-comm). --Middle cerebral arteries (MCA): Normal. IMPRESSION: 1. No acute intracranial abnormality. 2. 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 3. Normal intracranial MRA. Electronically Signed   By: Ulyses Jarred M.D.   On: 09/20/2020 00:53   MR Brain Wo Contrast (neuro protocol)  Result Date: 09/20/2020 CLINICAL DATA:  Dementia, fall and encephalopathy EXAM: MRI HEAD WITHOUT CONTRAST MRA HEAD WITHOUT CONTRAST TECHNIQUE: Multiplanar, multiecho pulse sequences of the brain and surrounding structures were obtained without intravenous contrast. Angiographic images of the head were obtained using MRA technique without contrast. COMPARISON:  None. 01/26/2019 FINDINGS: MRI HEAD FINDINGS Brain: No acute infarct, acute hemorrhage or extra-axial collection. Multifocal hyperintense T2-weighted signal within the white matter. Old right parietal infarct. Generalized volume loss without a clear lobar predilection. Hemosiderin deposition at the old right parietal lobe insult. Normal midline structures. Vascular: Normal flow voids. Skull and upper cervical spine: Normal marrow signal. Sinuses/Orbits: Negative. Other: None. MRA HEAD FINDINGS POSTERIOR CIRCULATION: --Vertebral arteries: Normal V4 segments. --Inferior cerebellar arteries: Normal. --Basilar artery: Normal. --Superior cerebellar arteries: Normal. --Posterior cerebral arteries: Normal. The left PCA is predominantly supplied by the posterior communicating artery. ANTERIOR CIRCULATION: --Intracranial internal carotid arteries: Normal. --Anterior cerebral arteries (ACA): Normal. Both A1 segments are present. Patent anterior communicating artery (a-comm). --Middle cerebral arteries (MCA): Normal. IMPRESSION: 1. No acute intracranial abnormality. 2. Old right parietal infarct and findings of chronic small vessel  disease. 3. Normal intracranial MRA. Electronically Signed   By: Ulyses Jarred M.D.   On: 09/20/2020 00:53   VAS US CAROTID (at [facility] and [facility] only)  Result Date: 09/20/2020 Carotid Arterial Duplex Study Indications:       CVA. Risk Factors:      Hypertension, hyperlipidemia. Limitations        Today's exam was limited due to the patient's respiratory                    variation, the body habitus of the patient and patient                    positioning. Comparison Study:  No prior studies. Performing Technologist: Oliver Hum RVT  Examination Guidelines: A complete evaluation includes B-mode imaging, spectral Doppler, color Doppler, and power Doppler as needed of all accessible portions of each vessel. Bilateral testing is considered an integral part of a complete examination. Limited examinations for reoccurring indications may be performed as noted.  Right Carotid Findings: +----------+--------+--------+--------+-----------------------+--------+           PSV cm/sEDV cm/sStenosisPlaque Description     Comments +----------+--------+--------+--------+-----------------------+--------+ CCA Prox  66      9               smooth and heterogenous         +----------+--------+--------+--------+-----------------------+--------+ CCA Distal51      9               smooth and heterogenous         +----------+--------+--------+--------+-----------------------+--------+ ICA Prox  56      7               smooth and heterogenous         +----------+--------+--------+--------+-----------------------+--------+ ICA Distal36      12  tortuous +----------+--------+--------+--------+-----------------------+--------+ ECA       77      6                                               +----------+--------+--------+--------+-----------------------+--------+ +----------+--------+-------+--------+-------------------+           PSV cm/sEDV cmsDescribeArm  Pressure (mmHG) +----------+--------+-------+--------+-------------------+ [id number]                                        +----------+--------+-------+--------+-------------------+ +---------+--------+--+--------+-+---------+ VertebralPSV cm/s34EDV cm/s7Antegrade +---------+--------+--+--------+-+---------+  Left Carotid Findings: +----------+--------+--------+--------+-----------------------+--------+           PSV cm/sEDV cm/sStenosisPlaque Description     Comments +----------+--------+--------+--------+-----------------------+--------+ CCA Prox  91      15              smooth and heterogenous         +----------+--------+--------+--------+-----------------------+--------+ CCA Distal64      16              smooth and heterogenous         +----------+--------+--------+--------+-----------------------+--------+ ICA Prox  48      12              smooth and heterogenous         +----------+--------+--------+--------+-----------------------+--------+ ICA Distal55      16                                     tortuous +----------+--------+--------+--------+-----------------------+--------+ ECA       85      12                                              +----------+--------+--------+--------+-----------------------+--------+ +----------+--------+--------+--------+-------------------+           PSV cm/sEDV cm/sDescribeArm Pressure (mmHG) +----------+--------+--------+--------+-------------------+ Subclavian162                                         +----------+--------+--------+--------+-------------------+ +---------+--------+--+--------+--+---------+ VertebralPSV cm/s68EDV cm/s11Antegrade +---------+--------+--+--------+--+---------+   Summary: Right Carotid: Velocities in the right ICA are consistent with a 1-39% stenosis. Left Carotid: Velocities in the left ICA are consistent with a 1-39% stenosis. Vertebrals: Bilateral vertebral  arteries demonstrate antegrade flow. *See table(s) above for measurements and observations.  Electronically signed by Monica Martinez MD on 09/20/2020 at 3:32:48 PM.    Final      Assessment and Plan:   #New Afib with RVR: CHADs-vasc 7. Patient with newly diagnosed Afib with RVR with rates 160s in ED. Now converted back to NSR with HR 80s currently. Off dilt gtt and transitioned to PO. On apixaban for anticoagulation. -Discussed risks versus benefits with the patient and her husband about anticoagulation as patient has very high stroke risk but also at increased risk of bleeding due to falls. After coordinated discussion, they agreed to continue to anticoagulation at this time. -Continue apixaban 5mg  BID -Continue dilt 60mg  q6h for now; transition to long-acting tomorrow -Continue telemetry -TSH normal -Follow-up TTE (EF appears normal based on prelim  review)  #HTN Well controlled. -Holding home HCTZ now that on Dilt -Monitor  #HLD -Continue rosuvastatin 10mg   #Mild carotid artery disease: -Continue statin -No ASA given need for [facility]  #History of prior CVA Neurology following. MRI with chronic cortical stroke.  -Carotids with minimal disease -Continue crestor 10mg  -No ASA given need for [facility]      :[id number]}  CHA2DS2-VASc Score = 7  This indicates a 11.2% annual risk of stroke. The patient's score is based upon: CHF History: 0 HTN History: 1 Diabetes History: 0 Stroke History: 2 Vascular Disease History: 1 Age Score: 2 Gender Score: 1      For questions or updates, please contact [place] Please consult [email or web address] for contact info under     Signed, Freada Bergeron, MD  09/20/2020 3:46 PM

## 2020-09-20 NOTE — Evaluation (Signed)
Speech Language Pathology Evaluation Patient Details Name: Jennifer Arroyo MRN: [id number] DOB: 1941-07-15 Today's Date: 09/20/2020 Time: 7619-5093 SLP Time Calculation (min) (ACUTE ONLY): 35 min  Problem List:  Patient Active Problem List   Diagnosis Date Noted  . CVA (cerebral vascular accident) ([place]) 09/19/2020  . New onset a-fib ([place]) 09/19/2020  . HTN (hypertension) 09/19/2020  . Hyperlipidemia 09/19/2020  . Dementia without behavioral disturbance ([place]) 09/19/2020  . Fall at home, initial encounter 09/19/2020  . Bacteria in urine 09/19/2020  . Atrial fibrillation with RVR ([place])   . Memory loss 01/11/2019  . Gait abnormality 01/11/2019   Past Medical History:  Past Medical History:  Diagnosis Date  . Bilateral bunions    hammer toe  . Cervical os stenosis   . Gallstones   . Hemangioma of liver    probable  . Hemorrhoids   . Hyperlipemia   . Hypertension   . Insomnia   . Left bundle branch block   . Left bundle branch block   . Low back pain   . Memory loss   . Plantar fasciitis   . Rectocele   . Renal cyst    Past Surgical History:  Past Surgical History:  Procedure Laterality Date  . APPENDECTOMY    . COLONOSCOPY    . TONSILLECTOMY     HPI:  Jennifer Arroyo is a 79 y.o. female with a history of dementia who presents with increased confusion over the past few days, today she fell and for that reason was brought into the emergency department.  She has had similar presentations with urinary tract infections in the past, and appears to have evidence of a UTI versus colonization.  A head CT 10/15 concerning for subacute stroke in the right parietal region; however, MRI 10/16 with no acute findings.  She was also found to have new onset atrial fibrillation.   Assessment / Plan / Recommendation Clinical Impression  Pt presents with moderate cognitive impairments.  Pt has known hx of dementia.  Pt was assessed using the COGNISTAT (see below for additional  information).  Overall pt appears pleasantly confused.  Pt oriented to person and place, but not time.  Pt exhibits severe impairment in memory, which is to be expected give dementia diagnosis.  Pt required 7 registration trials for word recall task, and was unable to recall items with cuing.  Pt was also noted to repeatedly ask SLP's name and spelling of name during today's session.  Pt's language appears intact. Pt's picture description was fluent, grammatically correct, and factually accurate, although she did not comment on the problems/safety concerns present.  Athough she had difficulty with commands, this is not suspected to be related to a receptive aphasia, but memory issues.  Pt was able to gather all items for multistep commands, but could not consistently execute correct action (point v. pick up v. "hand me"). Pt performed within average range for other subtests administered.  Pt's speech is clear and intelligible with no dysarthria noted.  Pt seems pleasantly demented without acute needs.  Pt states that she feels she is at baseline; however, she is suspected to be a poor historian.  Husband was not present for assessment and could not be reached by phone.  If pt's current cognitive functioning is different from baseline, please notify SLP.    SLP Assessment  SLP Recommendation/Assessment: Patient does not need any further Speech Lanaguage Pathology Services SLP Visit Diagnosis: Cognitive communication deficit (R41.841)    Follow Up Recommendations  None    Frequency and Duration   N/A        SLP Evaluation Cognition  Overall Cognitive Status: History of cognitive impairments - at baseline Arousal/Alertness: Awake/alert Orientation Level: Oriented to person;Oriented to place Attention: Focused;Sustained Focused Attention: Appears intact Sustained Attention: Appears intact Memory: Impaired Memory Impairment: Storage deficit;Decreased short term memory       Comprehension   Auditory Comprehension Overall Auditory Comprehension: Impaired Commands: Impaired    Expression Expression Primary Mode of Expression: Verbal Verbal Expression Overall Verbal Expression: Appears within functional limits for tasks assessed   Oral / Motor  Motor Speech Overall Motor Speech: Appears within functional limits for tasks assessed   [place]ra[place], MA, [place] Office: [phone]  09/20/2020, 11:45 AM

## 2020-09-20 NOTE — Evaluation (Signed)
Physical Therapy Evaluation Patient Details Name: Jennifer Arroyo MRN: [id number] DOB: 02/28/1941 Today's Date: 09/20/2020   History of Present Illness  Admitted 09/19/20 after falls at home. Found to have UTI, head CT  shows subacute stroke in the right parietal region.  Also with new onset atrial fibrillation. PMH significant for but not limited to LBBB, dementia, falls  Clinical Impression  Pt admitted with above diagnosis.  Pt currently with functional limitations due to the deficits listed below (see PT Problem List). Pt will benefit from skilled PT to increase their independence and safety with mobility to allow discharge to the venue listed below.  Pt has not had [facility] services in the past and I think she would benefit from HHPT for mobility training and home adaptation to increase safety. [name] is very open to this.     Follow Up Recommendations Home health PT;Supervision for mobility/OOB    Equipment Recommendations  None recommended by PT    Recommendations for Other Services       Precautions / Restrictions Precautions Precautions: Fall Restrictions Weight Bearing Restrictions: No      Mobility  Bed Mobility Overal bed mobility: Needs Assistance Bed Mobility: Supine to Sit     Supine to sit: Min assist        Transfers Overall transfer level: Needs assistance   Transfers: Sit to/from Stand Sit to Stand: Min guard         General transfer comment: verbal cues for hand placement and safest technique  Ambulation/Gait Ambulation/Gait assistance: Min guard Gait Distance (Feet): 8 Feet Assistive device: Rolling walker (2 wheeled) Gait Pattern/deviations: Step-to pattern;Wide base of support;Decreased stride length Gait velocity: decreased Gait velocity interpretation: <1.31 ft/sec, indicative of household ambulator General Gait Details: pt with excessive weight shift to each side with gait, very small steps.   Stairs            Wheelchair  Mobility    Modified Rankin (Stroke Patients Only) Modified Rankin (Stroke Patients Only) Pre-Morbid Rankin Score: Moderately severe disability Modified Rankin: Moderately severe disability (score related to dementia and needing supervision)     Balance Overall balance assessment: Needs assistance Sitting-balance support: No upper extremity supported;Feet supported Sitting balance-Leahy Scale: Good                                       Pertinent Vitals/Pain Pain Assessment: No/denies pain    Home Living Family/patient expects to be discharged to:: Private residence Living Arrangements: Spouse/significant other Available Help at Discharge: Family;Available 24 hours/day Type of Home: House Home Access: Stairs to enter Entrance Stairs-Rails: None Entrance Stairs-Number of Steps: 1 Home Layout: One level Home Equipment: Environmental consultant - 2 wheels      Prior Function Level of Independence: Needs assistance   Gait / Transfers Assistance Needed: generally modified Independent, but limited distance  ADL's / Homemaking Assistance Needed: Husband performed all homemaking duties        Hand Dominance        Extremity/Trunk Assessment   Upper Extremity Assessment Upper Extremity Assessment: Defer to OT evaluation    Lower Extremity Assessment Lower Extremity Assessment: Generalized weakness (no focal weakness identified)       Communication   Communication: No difficulties  Cognition Arousal/Alertness: Awake/alert Behavior During Therapy: WFL for tasks assessed/performed Overall Cognitive Status: History of cognitive impairments - at baseline  General Comments General comments (skin integrity, edema, etc.): husband present throughout. Reports he is starting to look into retirement communities to get more support    Exercises     Assessment/Plan    PT Assessment Patient needs continued PT services   PT Problem List Decreased strength;Decreased balance;Decreased mobility;Decreased safety awareness       PT Treatment Interventions DME instruction;Gait training;Therapeutic activities;Therapeutic exercise;Balance training    PT Goals (Current goals can be found in the Care Plan section)  Acute Rehab PT Goals Patient Stated Goal: pt and husband report goal is to return home PT Goal Formulation: With patient/family Time For Goal Achievement: 09/27/20 Potential to Achieve Goals: Good    Frequency Min 3X/week   Barriers to discharge        Co-evaluation               AM-PAC PT "6 Clicks" Mobility  Outcome Measure Help needed turning from your back to your side while in a flat bed without using bedrails?: A Little Help needed moving from lying on your back to sitting on the side of a flat bed without using bedrails?: A Little Help needed moving to and from a bed to a chair (including a wheelchair)?: A Little Help needed standing up from a chair using your arms (e.g., wheelchair or bedside chair)?: A Little Help needed to walk in hospital room?: A Little Help needed climbing 3-5 steps with a railing? : A Lot 6 Click Score: 17    End of Session Equipment Utilized During Treatment: Gait belt Activity Tolerance: Patient tolerated treatment well Patient left: in chair;with call bell/phone within reach;with chair alarm set;with family/visitor present Nurse Communication: Mobility status PT Visit Diagnosis: Unsteadiness on feet (R26.81)    Time: 1660-6301 PT Time Calculation (min) (ACUTE ONLY): 40 min   Charges:   PT Evaluation $PT Eval Moderate Complexity: 1 Mod PT Treatments $Gait Training: 23-37 mins        Lavonia Dana, PT   Acute Rehabilitation Services  Pager [phone] Office [phone] 09/20/2020   Melvern Banker 09/20/2020, 10:02 AM

## 2020-09-20 NOTE — Evaluation (Signed)
Occupational Therapy Evaluation Patient Details Name: Jennifer Arroyo MRN: [id number] DOB: Jul 15, 1941 Today's Date: 09/20/2020    History of Present Illness Admitted 09/19/20 after falls at home. Found to have UTI, head CT  shows subacute stroke in the right parietal region.  Also with new onset atrial fibrillation. PMH significant for but not limited to LBBB, dementia, falls   Clinical Impression   Pt admitted with the above.  She demonstrates impaired balance, decreased activity tolerance, and impaired cognition.  She currently requires min guard assist for ADLs and functional mobility, but is at risk for falls.  She lives with her spouse, who is very supportive, and provides 24 hour supervision.  PTA, she was able to perform ADLs with modified independent to supervision.   Recommend HHOT and a HHaide at discharge.  All further OT needs can be addressed by [facility].  Acute OT will sign off at this time.     Follow Up Recommendations  Home health OT;Supervision/Assistance - 24 hour;Other (comment) (HHaide )    Equipment Recommendations  None recommended by OT    Recommendations for Other Services       Precautions / Restrictions Precautions Precautions: Fall      Mobility Bed Mobility Overal bed mobility: Needs Assistance Bed Mobility: Supine to Sit;Sit to Supine     Supine to sit: Min guard Sit to supine: Min guard   General bed mobility comments: min guard for safety   Transfers Overall transfer level: Needs assistance Equipment used: Rolling walker (2 wheeled) Transfers: Sit to/from [employer] Sit to Stand: Min guard Stand pivot transfers: Min guard       General transfer comment: verbal cues for hand placement and walker safety     Balance Overall balance assessment: Needs assistance Sitting-balance support: No upper extremity supported;Feet supported Sitting balance-Leahy Scale: Fair Sitting balance - Comments: static balance with supervision.   She looses balance to the Rt with dynamic tasks    Standing balance support: No upper extremity supported Standing balance-Leahy Scale: Fair Standing balance comment: able to maintain static standing with min guard assist                            ADL either performed or assessed with clinical judgement   ADL Overall ADL's : Needs assistance/impaired Eating/Feeding: Independent   Grooming: Wash/dry hands;Wash/dry face;Oral care;Brushing hair;Min guard;Standing Grooming Details (indicate cue type and reason): verbal cues for walker safety  Upper Body Bathing: Set up;Supervision/ safety;Sitting   Lower Body Bathing: Min guard;Sit to/from stand   Upper Body Dressing : Set up;Sitting   Lower Body Dressing: Min guard;Sit to/from stand Lower Body Dressing Details (indicate cue type and reason): close min guard as pt looses balance to the Rt when donning socks  Toilet Transfer: Min guard;Ambulation;Comfort height toilet;RW   Toileting- Water quality scientist and Hygiene: Min guard;Sit to/from stand       Functional mobility during ADLs: Min guard;Rolling walker       Vision Baseline Vision/History: Wears glasses Wears Glasses: At all times Patient Visual Report: No change from baseline Vision Assessment?: Yes Eye Alignment: Within Functional Limits Ocular Range of Motion: Within Functional Limits Alignment/Gaze Preference: Within Defined Limits Visual Fields: No apparent deficits Additional Comments: able to read clock on wall      Perception Perception Perception Tested?: Yes   Praxis Praxis Praxis tested?: Within functional limits    Pertinent Vitals/Pain Pain Assessment: No/denies pain Faces Pain Scale: No  hurt     Hand Dominance     Extremity/Trunk Assessment Upper Extremity Assessment Upper Extremity Assessment: Overall WFL for tasks assessed;LUE deficits/detail LUE Deficits / Details: Limited shoulder ROM due to dislocation years ago    Lower  Extremity Assessment Lower Extremity Assessment: Defer to PT evaluation   Cervical / Trunk Assessment Cervical / Trunk Assessment: Normal   Communication Communication Communication: No difficulties   Cognition Arousal/Alertness: Awake/alert Behavior During Therapy: WFL for tasks assessed/performed Overall Cognitive Status: History of cognitive impairments - at baseline                                 General Comments: Pt with memory deficits.  She has a great sense of humor    General Comments  husband present during eval and is very supportive     Exercises     Shoulder Instructions      Home Living Family/patient expects to be discharged to:: Private residence Living Arrangements: Spouse/significant other Available Help at Discharge: Family;Available 24 hours/day Type of Home: House Home Access: Stairs to enter [employer] of Steps: 1 Entrance Stairs-Rails: None Home Layout: One level     Bathroom Shower/Tub: Occupational psychologist: Standard     Home Equipment: Environmental consultant - 2 wheels;Shower seat - built in   Additional Comments: spouse very supportive and able to provide 24 hour assist   Lives With: Spouse    Prior Functioning/Environment Level of Independence: Needs assistance  Gait / Transfers Assistance Needed: generally modified Independent, but limited distance ADL's / Homemaking Assistance Needed: Pt typically is able to perform ADLs modified independently - supervision.  Spouse manages all IADLs             OT Problem List: Decreased activity tolerance;Impaired balance (sitting and/or standing);Decreased cognition;Decreased safety awareness;Decreased knowledge of precautions      OT Treatment/Interventions: Self-care/ADL training;Neuromuscular education;DME and/or AE instruction;Therapeutic activities;Cognitive remediation/compensation;Patient/family education;Balance training    OT Goals(Current goals can be found in  the care plan section) Acute Rehab OT Goals Patient Stated Goal: for balance to improve  OT Goal Formulation: All assessment and education complete, DC therapy  OT Frequency:     Barriers to D/C:            Co-evaluation              AM-PAC OT "6 Clicks" Daily Activity     Outcome Measure Help from another person eating meals?: None Help from another person taking care of personal grooming?: A Little Help from another person toileting, which includes using toliet, bedpan, or urinal?: A Little Help from another person bathing (including washing, rinsing, drying)?: A Little Help from another person to put on and taking off regular upper body clothing?: A Little Help from another person to put on and taking off regular lower body clothing?: A Little 6 Click Score: 19   End of Session Equipment Utilized During Treatment: Gait belt;Rolling walker Nurse Communication: Mobility status  Activity Tolerance: Patient tolerated treatment well Patient left: in bed;with call bell/phone within reach;with bed alarm set;with family/visitor present  OT Visit Diagnosis: Unsteadiness on feet (R26.81);Cognitive communication deficit (R41.841) Symptoms and signs involving cognitive functions: Cerebral infarction                Time: 9604-5409 OT Time Calculation (min): 38 min Charges:  OT General Charges $OT Visit: 1 Visit OT Evaluation $OT Eval Moderate Complexity: 1  Mod OT Treatments $Self Care/Home Management : 23-37 mins  Nilsa Nutting., OTR/L Acute Rehabilitation Services Pager [phone] Office [phone]   Lucille Passy M 09/20/2020, 3:20 PM

## 2020-09-20 NOTE — Progress Notes (Signed)
STROKE TEAM PROGRESS NOTE   INTERVAL HISTORY Her RN and husband are at the bedside.  Patient awake alert, interactive, orientated to place and people and year but not to age or month.  Moving all extremities.  MRI overnight showed right parietal stroke was chronic.  No new stroke.  Given her new diagnosed A. fib RVR, will start Eliquis.  OBJECTIVE Vitals:   09/19/20 2003 09/19/20 2321 09/20/20 0359 09/20/20 0829  BP: 110/64 99/76 (!) 93/56 127/68  Pulse: 70 63 76 76  Resp:  17 17 20   Temp: 98.4 F (36.9 C) 98 F (36.7 C) 97.9 F (36.6 C) 98.4 F (36.9 C)  TempSrc: Oral  Oral Oral  SpO2: 95% 98% 97% 98%    CBC:  Recent Labs  Lab 09/19/20 1200 09/20/20 0418  WBC 16.5* 12.2*  NEUTROABS 12.8* 9.0*  HGB 16.6* 14.5  HCT 48.3* 43.3  MCV 95.5 97.1  PLT 220 811    Basic Metabolic Panel:  Recent Labs  Lab 09/19/20 1200 09/20/20 0418  NA 140 140  K 3.2* 4.2  CL 104 107  CO2 23 23  GLUCOSE 143* 105*  BUN 28* 22  CREATININE 0.84 0.92  CALCIUM 10.2 9.4  MG 2.4 2.0    Lipid Panel:     Component Value Date/Time   CHOL 135 09/20/2020 0418   TRIG 88 09/20/2020 0418   HDL 46 09/20/2020 0418   CHOLHDL 2.9 09/20/2020 0418   VLDL 18 09/20/2020 0418   LDLCALC 71 09/20/2020 0418   HgbA1c: No results found for: HGBA1C Urine Drug Screen: No results found for: LABOPIA, COCAINSCRNUR, LABBENZ, AMPHETMU, THCU, LABBARB  Alcohol Level No results found for: [facility]  IMAGING   CT Head Wo Contrast  Addendum Date: 09/19/2020   ADDENDUM REPORT: 09/19/2020 14:36 ADDENDUM: These results were called by telephone at the time of interpretation on 09/19/2020 at 2:26 pm to provider [facility] PATEL , who verbally acknowledged these results. Electronically Signed   By: Primitivo Gauze M.D.   On: 09/19/2020 14:36   Result Date: 09/19/2020 CLINICAL DATA:  Neuro deficit, stroke suspected. EXAM: CT HEAD WITHOUT CONTRAST TECHNIQUE: Contiguous axial images were obtained from the base of the skull  through the vertex without intravenous contrast. COMPARISON:  01/26/2019 MRI head. FINDINGS: Brain: New hypodensity involving the right parietal lobe with extension to the cortex is concerning for subacute infarct. Diffuse parenchymal volume loss with ex vacuo dilatation. Ill-defined focal hypodensity involving the left corona radiata. No mass lesion. No midline shift, ventriculomegaly or extra-axial fluid collection. Vascular: No hyperdense vessel or unexpected calcification. Bilateral skull base atherosclerotic calcifications. Skull: Negative for fracture or focal lesion. Sinuses/Orbits: Normal orbits. Clear paranasal sinuses. No mastoid effusion. Other: None. IMPRESSION: 1. Right parietal hypodensity is concerning for subacute infarct. 2. Ill-defined focal hypodensity involving the left corona radiata may represent an age-indeterminate infarct versus chronic microvascular ischemic changes. 3. Moderate cerebral atrophy. Electronically Signed: By: Primitivo Gauze M.D. On: 09/19/2020 14:23   MR ANGIO HEAD WO CONTRAST  Result Date: 09/20/2020 CLINICAL DATA:  Dementia, fall and encephalopathy EXAM: MRI HEAD WITHOUT CONTRAST MRA HEAD WITHOUT CONTRAST TECHNIQUE: Multiplanar, multiecho pulse sequences of the brain and surrounding structures were obtained without intravenous contrast. Angiographic images of the head were obtained using MRA technique without contrast. COMPARISON:  None. 01/26/2019 FINDINGS: MRI HEAD FINDINGS Brain: No acute infarct, acute hemorrhage or extra-axial collection. Multifocal hyperintense T2-weighted signal within the white matter. Old right parietal infarct. Generalized volume loss without a clear lobar predilection.  Hemosiderin deposition at the old right parietal lobe insult. Normal midline structures. Vascular: Normal flow voids. Skull and upper cervical spine: Normal marrow signal. Sinuses/Orbits: Negative. Other: None. MRA HEAD FINDINGS POSTERIOR CIRCULATION: --Vertebral arteries:  Normal V4 segments. --Inferior cerebellar arteries: Normal. --Basilar artery: Normal. --Superior cerebellar arteries: Normal. --Posterior cerebral arteries: Normal. The left PCA is predominantly supplied by the posterior communicating artery. ANTERIOR CIRCULATION: --Intracranial internal carotid arteries: Normal. --Anterior cerebral arteries (ACA): Normal. Both A1 segments are present. Patent anterior communicating artery (a-comm). --Middle cerebral arteries (MCA): Normal. IMPRESSION: 1. No acute intracranial abnormality. 2. 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 3. Normal intracranial MRA. Electronically Signed   By: Ulyses Jarred M.D.   On: 09/20/2020 00:53   MR Brain Wo Contrast (neuro protocol)  Result Date: 09/20/2020 CLINICAL DATA:  Dementia, fall and encephalopathy EXAM: MRI HEAD WITHOUT CONTRAST MRA HEAD WITHOUT CONTRAST TECHNIQUE: Multiplanar, multiecho pulse sequences of the brain and surrounding structures were obtained without intravenous contrast. Angiographic images of the head were obtained using MRA technique without contrast. COMPARISON:  None. 01/26/2019 FINDINGS: MRI HEAD FINDINGS Brain: No acute infarct, acute hemorrhage or extra-axial collection. Multifocal hyperintense T2-weighted signal within the white matter. Old right parietal infarct. Generalized volume loss without a clear lobar predilection. Hemosiderin deposition at the old right parietal lobe insult. Normal midline structures. Vascular: Normal flow voids. Skull and upper cervical spine: Normal marrow signal. Sinuses/Orbits: Negative. Other: None. MRA HEAD FINDINGS POSTERIOR CIRCULATION: --Vertebral arteries: Normal V4 segments. --Inferior cerebellar arteries: Normal. --Basilar artery: Normal. --Superior cerebellar arteries: Normal. --Posterior cerebral arteries: Normal. The left PCA is predominantly supplied by the posterior communicating artery. ANTERIOR CIRCULATION: --Intracranial internal  carotid arteries: Normal. --Anterior cerebral arteries (ACA): Normal. Both A1 segments are present. Patent anterior communicating artery (a-comm). --Middle cerebral arteries (MCA): Normal. IMPRESSION: 1. No acute intracranial abnormality. 2. 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 3. Normal intracranial MRA. Electronically Signed   By: Ulyses Jarred M.D.   On: 09/20/2020 00:53   DG Chest Port 1 View  Result Date: 09/19/2020 CLINICAL DATA:  Fall.  Short of breath. EXAM: PORTABLE CHEST 1 VIEW COMPARISON:  None. FINDINGS: Elevated right hemidiaphragm with right lower lobe patchy airspace disease likely atelectasis. Left lung clear. Heart size mildly enlarged. Negative for heart failure or edema. No significant pleural fluid. IMPRESSION: Elevated right hemidiaphragm with right lower lobe atelectasis. Electronically Signed   By: Franchot Gallo M.D.   On: 09/19/2020 11:59    ECG - atrial fibrillation - ventricular response 164 BPM (See cardiology reading for complete details)  PHYSICAL EXAM  Temp:  [97.9 F (36.6 C)-98.5 F (36.9 C)] 98.1 F (36.7 C) (10/16 1211) Pulse Rate:  [41-115] 76 (10/16 1211) Resp:  [16-23] 20 (10/16 1211) BP: (93-129)/(56-90) 115/70 (10/16 1211) SpO2:  [92 %-98 %] 94 % (10/16 1211)  General - Well nourished, well developed, in no apparent distress.  Ophthalmologic - fundi not visualized due to noncooperation.  Cardiovascular - Regular rhythm and rate, not in A. fib.  Mental Status -  Level of arousal and orientation to year, place, and person were intact, however, incorrect on months and age. Language including expression, naming, repetition, comprehension was assessed and found intact.  Cranial Nerves II - XII - II - Visual field intact OU. III, IV, VI - Extraocular movements intact. V - Facial sensation intact bilaterally. VII - Facial movement intact bilaterally. VIII - Hearing & vestibular intact bilaterally. X - Palate  elevates symmetrically. XI - Chin turning & shoulder shrug intact bilaterally. XII - Tongue protrusion intact.  Motor Strength - The patient's strength was normal in all extremities and pronator drift was absent.  Bulk was normal and fasciculations were absent.   Motor Tone - Muscle tone was assessed at the neck and appendages and was normal.  Reflexes - The patient's reflexes were symmetrical in all extremities and she had no pathological reflexes.  Sensory - Light touch, temperature/pinprick were assessed and were symmetrical.    Coordination - The patient had normal movements in the hands with no ataxia or dysmetria.  Tremor was absent.  Gait and Station - deferred.   ASSESSMENT/PLAN [name] is a 79 y.o. female with history of dementia, HLD, Htn, LBBB, and UTI's who presents with increased confusion, a recent fall, and newly diagnosed atrial fibrillation.  She did not receive IV t-PA due to unknown time of onset.  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   MRI head - No acute intracranial abnormality. 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  MRA head - normal  Carotid Doppler unremarkable  2D Echo - pending  Sars Corona Virus 2 - negative  LDL - 71  HgbA1c - pending  VTE prophylaxis - Lovenox  No antithrombotic prior to admission, now on aspirin 325 mg daily.  Given chronic cortical stroke and new diagnosed A. fib, recommend Eliquis for stroke prevention.  Aspirin can be discontinued.  Patient counseled to be compliant with her antithrombotic medications  Ongoing aggressive stroke risk factor management  Therapy recommendations: Home health PT  Disposition:  Pending  Newly Diagnosed Atrial Fibrillation with RVR  CHA2DS2-VASc Score - 6 - recommend anticoagulation Age in Years:(75 or >  +2)  Sex:(Female +1) Hypertension History: yes +1   Stroke/TIA/Thromboembolism History: yes +2  Rate controlled with diltiazem  EKG ER showed A. fib RVR  On Cardizem  MRI showed chronic right parietal stroke  We will start Eliquis for stroke prevention  UTI  UA WBC 21-50  Urine culture pending  Leukocytosis WBC 12.2  History of recent UTI  Management per primary team  Hypertension  Home BP meds: HCTZ ; metoprolol  Current BP meds: diltiazem . 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 Please call with questions. Pt will follow up with Dr. Krista Blue at [facility] in about 4 weeks. Thanks for the consult.  Rosalin Hawking, MD PhD Stroke Neurology 09/20/2020 1:17 PM    To contact Stroke Continuity provider, please refer to [email or web address] After hours, contact General Neurology

## 2020-09-20 NOTE — Progress Notes (Signed)
  Echocardiogram 2D Echocardiogram has been performed.  Jennifer Arroyo 09/20/2020, 10:53 AM

## 2020-09-20 NOTE — Progress Notes (Addendum)
1826 Patient converted back to Afib. HR sustaining in 160s. On call cardiologist paged. Received an order to infusing cardizem drip. New IV placed. Cardizem drip initiated at 5mg /hr. Patient tolerating it well.   1936 HR sustaining in 140s, rate increased to 10mg /hr.   .BP 114/73 (BP Location: Left Arm)   Pulse 70   Temp 98.4 F (36.9 C) (Oral)   Resp 16   SpO2 93%

## 2020-09-20 NOTE — Progress Notes (Signed)
PROGRESS NOTE    Jennifer Arroyo  MCN:[id number] DOB: [birth year] DOA: 09/19/2020 PCP: Kathyrn Lass, MD   Brief Narrative:  Jennifer Arroyo is a 79 y.o. female with medical history significant of dementia/memory loss, hypertension, hyperlipidemia, history of left bundle branch block who presented to the ED with falls. Per husband over the past few days patient has been a little bit of/confused from her usual baseline which has been ongoing for the past 5 days with associated falls.  Patient had a fall 5 days prior to admission as well as a fall the morning of admission.  Patient did not hit her head or lose consciousness.  Patient has not mentioned any other complaint.  Patient seen in the ED and on initial presentation was noted to be in A. fib with RVR with heart rates in the 160s and subsequently placed on a Cardizem drip.  Initial set of troponin was 61.  Lactic acid level of 1.4.    Most of the labs were unremarkable except mild hypokalemia. Influenza a and B and SARS coronavirus 2 PCR negative.  CT head with right parietal hypodensity concerning for subacute infarct, ill-defined focal hypodensity involving the left corona radiator may represent an age-indeterminate infarct versus chronic microvascular ischemic changes.  Chest x-ray negative for any acute infiltrate.  Urinalysis clear, many bacteria, 21-50 WBCs.  EKG with A. fib with RVR with heart rates of 164.  Patient placed on a Cardizem drip.  Patient given a full dose aspirin.  Admitted to hospitalist service and transfer to [facility] for neurology consultation, MRI and further work-up.  Neurology recommended holding anticoagulation until hemorrhage ruled out and MRI.  MRI brain showed no acute intracranial abnormality and normal intracranial MRA.  Seen by neurology again and started on Eliquis.  Cardiology has also been consulted for new onset atrial fibrillation which is now under control.  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  CT, MRI and MRA brain as below.  Carotid Doppler unremarkable.  2D echo pending.  Hemoglobin A1c pending.  Neurology started on Eliquis and aspirin discontinued.  Seen by PT OT and they recommend home health PT.  2.  New onset/newly discovered A. Fib( CHA2DS2VASC score > 2): Patient needed to be A. fib with RVR on presentation with heart rates in the 160s and placed on a Cardizem drip.  Subsequently switched to oral Cardizem 30 mg every 6 hours.  Rates controlled controlled.  TSH is still pending.  Echo pending.  Cardiology to see today.   3.  Essential hypertension: Since the stroke is old so no need to allow permissive hypertension.  Blood pressure within normal range today despite of holding home dose of HCTZ and Toprol-XL.  4.  Hyperlipidemia: Started on Crestor 10 mg by neurology.  5.  Dementia/memory loss: Continue current medications.  6.  Asymptomatic bacteriuria: No antibiotic indicated.   DVT prophylaxis:    Code Status: DNR  Family Communication: None present at bedside.  Plan of care discussed with patient in length and he verbalized understanding and agreed with it.  Neurologist discussed plan of care with husband at the bedside.  Status is: Inpatient  Remains inpatient  appropriate because:Inpatient level of care appropriate due to severity of illness   Dispo: The patient is from: Home              Anticipated d/c is to: Home              Anticipated d/c date is: 1 day              Patient currently is not medically stable to d/c.        Estimated body mass index is 31.47 kg/m as calculated from the following:   Height as of 03/27/20: 5\' 6"   (1.676 m).   Weight as of 03/27/20: 88.5 kg.      Nutritional status:               Consultants:   Neurology and cardiology  Procedures:   None  Antimicrobials:  Anti-infectives (From admission, onward)   Start     Dose/Rate Route Frequency Ordered Stop   09/20/20 1000  valACYclovir (VALTREX) tablet 500 mg        500 mg Oral Daily 09/19/20 1937           Subjective: Patient seen and examined.  She has no complaints.  She was alert and mostly oriented.  She knew that she was at [facility]  She only missed the president of the [place].  Objective: Vitals:   09/19/20 2321 09/20/20 0359 09/20/20 0829 09/20/20 1211  BP: 99/76 (!) 93/56 127/68 115/70  Pulse: 63 76 76 76  Resp: 17 17 20 20   Temp: 98 F (36.7 C) 97.9 F (36.6 C) 98.4 F (36.9 C) 98.1 F (36.7 C)  TempSrc:  Oral Oral Oral  SpO2: 98% 97% 98% 94%    Intake/Output Summary (Last 24 hours) at 09/20/2020 1341 Last data filed at 09/20/2020 0820 Gross per 24 hour  Intake 120 ml  Output --  Net 120 ml   There were no vitals filed for this visit.  Examination:  General exam: Appears calm and comfortable  Respiratory system: Clear to auscultation. Respiratory effort normal. Cardiovascular system: S1 & S2 heard, irregularly irregular rate and rhythm. No JVD, murmurs, rubs, gallops or clicks. No pedal edema. Gastrointestinal system: Abdomen is nondistended, soft and nontender. No organomegaly or masses felt. Normal bowel sounds heard. Central nervous system: Alert and oriented. No focal neurological deficits. Extremities: Symmetric 5 x 5 power. Skin: No rashes, lesions or ulcers    Data Reviewed: I have personally reviewed following labs and imaging studies  CBC: Recent Labs  Lab 09/19/20 1200 09/20/20 0418  WBC 16.5* 12.2*  NEUTROABS 12.8* 9.0*  HGB 16.6* 14.5  HCT 48.3* 43.3  MCV 95.5 97.1  PLT 220 628   Basic Metabolic Panel: Recent Labs  Lab 09/19/20 1200 09/20/20 0418  NA  140 140  K 3.2* 4.2  CL 104 107  CO2 23 23  GLUCOSE 143* 105*  BUN 28* 22  CREATININE 0.84 0.92  CALCIUM 10.2 9.4  MG 2.4 2.0   GFR: CrCl cannot be calculated (Unknown ideal weight.). Liver Function Tests: Recent Labs  Lab 09/19/20 1200  AST 27  ALT 23  ALKPHOS 67  BILITOT 1.3*  PROT 7.7  ALBUMIN 3.8   No results for input(s): LIPASE, AMYLASE in the last 168 hours. No results for input(s): AMMONIA in the last 168 hours. Coagulation Profile: Recent Labs  Lab 09/19/20 1540  INR 1.1   Cardiac Enzymes: No results for input(s): CKTOTAL, CKMB, CKMBINDEX, TROPONINI in the last  168 hours. BNP (last 3 results) No results for input(s): PROBNP in the last 8760 hours. HbA1C: No results for input(s): HGBA1C in the last 72 hours. CBG: No results for input(s): GLUCAP in the last 168 hours. Lipid Profile: Recent Labs    09/20/20 0418  CHOL 135  HDL 46  LDLCALC 71  TRIG 88  CHOLHDL 2.9   Thyroid Function Tests: Recent Labs    09/19/20 1308  TSH 0.687   Anemia Panel: No results for input(s): VITAMINB12, FOLATE, FERRITIN, TIBC, IRON, RETICCTPCT in the last 72 hours. Sepsis Labs: Recent Labs  Lab 09/19/20 1308  LATICACIDVEN 1.3  1.4    Recent Results (from the past 240 hour(s))  Urine culture     Status: None (Preliminary result)   Collection Time: 09/19/20 12:00 PM   Specimen: Urine, Clean Catch  Result Value Ref Range Status   Specimen Description   Final    URINE, CLEAN CATCH Performed at [facility], [place] [address], [place], [place] [address]    Special Requests   Final    NONE Performed at [facility], [place] [address] [place], [place] [address]    Culture   Final    CULTURE REINCUBATED FOR BETTER GROWTH Performed at [facility], [place] [address] [place], [place] [address]    Report Status PENDING  Incomplete  Blood culture (routine x 2)     Status: None (Preliminary result)   Collection Time: 09/19/20   1:08 PM   Specimen: BLOOD RIGHT FOREARM  Result Value Ref Range Status   Specimen Description   Final    BLOOD RIGHT FOREARM Performed at [place] [address], [place], [place] [address]    Special Requests   Final    BOTTLES DRAWN AEROBIC AND ANAEROBIC Blood Culture adequate volume Performed at [place] [address], [place], [place] [address]    Culture   Final    NO GROWTH < 24 HOURS Performed at [place] [address] [place], [place] [address]    Report Status PENDING  Incomplete  Blood culture (routine x 2)     Status: None (Preliminary result)   Collection Time: 09/19/20  1:08 PM   Specimen: BLOOD LEFT HAND  Result Value Ref Range Status   Specimen Description   Final    BLOOD LEFT HAND Performed at [place] [address], [place], [place] [address]    Special Requests   Final    BOTTLES DRAWN AEROBIC AND ANAEROBIC Blood Culture adequate volume Performed at [place] [address], [place], [place] [address]    Culture   Final    NO GROWTH < 24 HOURS Performed at [place] [address], [place],  [address]    Report Status PENDING  Incomplete  Respiratory Panel by RT PCR (Flu A&B, Covid) - Nasopharyngeal Swab     Status: None   Collection Time: 09/19/20  1:24 PM   Specimen: Nasopharyngeal Swab  Result Value Ref Range Status   SARS Coronavirus 2 by RT PCR NEGATIVE NEGATIVE Final    Comment: (NOTE) SARS-CoV-2 target nucleic acids are NOT DETECTED.  The SARS-CoV-2 RNA is generally detectable in upper respiratoy specimens during the acute phase of infection. The lowest concentration of SARS-CoV-2 viral copies this assay can detect is 131 copies/mL. A negative result does not preclude SARS-Cov-2 infection and should not be used as  the sole basis for treatment or other patient management decisions. A negative result may occur with    improper specimen collection/handling, submission of specimen other than nasopharyngeal swab, presence of viral mutation(s) within the areas targeted by this assay, and inadequate number of viral copies (<131 copies/mL). A negative result must be combined with clinical observations, patient history, and epidemiological information. The expected result is Negative.  Fact Sheet for Patients:  PinkCheek.be  Fact Sheet for Healthcare Providers:  GravelBags.it  This test is no t yet approved or cleared by the [place] FDA and  has been authorized for detection and/or diagnosis of SARS-CoV-2 by FDA under an Emergency Use Authorization (EUA). This EUA will remain  in effect (meaning this test can be used) for the duration of the COVID-19 declaration under Section 564(b)(1) of the Act, 21 U.S.C. section 360bbb-3(b)(1), unless the authorization is terminated or revoked sooner.     Influenza A by PCR NEGATIVE NEGATIVE Final   Influenza B by PCR NEGATIVE NEGATIVE Final    Comment: (NOTE) The Xpert Xpress SARS-CoV-2/FLU/RSV assay is intended as an aid in  the diagnosis of influenza from Nasopharyngeal swab specimens and  should not be used as a sole basis for treatment. Nasal washings and  aspirates are unacceptable for Xpert Xpress SARS-CoV-2/FLU/RSV  testing.  Fact Sheet for Patients: PinkCheek.be  Fact Sheet for Healthcare Providers: GravelBags.it  This test is not yet approved or cleared by the [place] FDA and  has been authorized for detection and/or diagnosis of SARS-CoV-2 by  FDA under an Emergency Use Authorization (EUA). This EUA will remain  in effect (meaning this test can be used) for the duration of the  Covid-19 declaration under Section 564(b)(1) of the Act, 21  U.S.C. section 360bbb-3(b)(1), unless the authorization is  terminated or  revoked. Performed at [facility], [place] [address], [place], [place] [address]       Radiology Studies: CT Head Wo Contrast  Addendum Date: 09/19/2020   ADDENDUM REPORT: 09/19/2020 14:36 ADDENDUM: These results were called by telephone at the time of interpretation on 09/19/2020 at 2:26 pm to provider [facility] PATEL , who verbally acknowledged these results. Electronically Signed   By: Primitivo Gauze M.D.   On: 09/19/2020 14:36   Result Date: 09/19/2020 CLINICAL DATA:  Neuro deficit, stroke suspected. EXAM: CT HEAD WITHOUT CONTRAST TECHNIQUE: Contiguous axial images were obtained from the base of the skull through the vertex without intravenous contrast. COMPARISON:  01/26/2019 MRI head. FINDINGS: Brain: New hypodensity involving the right parietal lobe with extension to the cortex is concerning for subacute infarct. Diffuse parenchymal volume loss with ex vacuo dilatation. Ill-defined focal hypodensity involving the left corona radiata. No mass lesion. No midline shift, ventriculomegaly or extra-axial fluid collection. Vascular: No hyperdense vessel or unexpected calcification. Bilateral skull base atherosclerotic calcifications. Skull: Negative for fracture or focal lesion. Sinuses/Orbits: Normal orbits. Clear paranasal sinuses. No mastoid effusion. Other: None. IMPRESSION: 1. Right parietal hypodensity is concerning for subacute infarct. 2. Ill-defined focal hypodensity involving the left corona radiata may represent an age-indeterminate infarct versus chronic microvascular ischemic changes. 3. Moderate cerebral atrophy. Electronically Signed: By: Primitivo Gauze M.D. On: 09/19/2020 14:23   MR ANGIO HEAD WO CONTRAST  Result Date: 09/20/2020 CLINICAL DATA:  Dementia, fall and encephalopathy EXAM: MRI HEAD WITHOUT CONTRAST MRA HEAD WITHOUT CONTRAST TECHNIQUE: Multiplanar, multiecho pulse sequences of the brain and surrounding structures were obtained without intravenous  contrast. Angiographic images of the head were obtained using MRA  technique without contrast. COMPARISON:  None. 01/26/2019 FINDINGS: MRI HEAD FINDINGS Brain: No acute infarct, acute hemorrhage or extra-axial collection. Multifocal hyperintense T2-weighted signal within the white matter. Old right parietal infarct. Generalized volume loss without a clear lobar predilection. Hemosiderin deposition at the old right parietal lobe insult. Normal midline structures. Vascular: Normal flow voids. Skull and upper cervical spine: Normal marrow signal. Sinuses/Orbits: Negative. Other: None. MRA HEAD FINDINGS POSTERIOR CIRCULATION: --Vertebral arteries: Normal V4 segments. --Inferior cerebellar arteries: Normal. --Basilar artery: Normal. --Superior cerebellar arteries: Normal. --Posterior cerebral arteries: Normal. The left PCA is predominantly supplied by the posterior communicating artery. ANTERIOR CIRCULATION: --Intracranial internal carotid arteries: Normal. --Anterior cerebral arteries (ACA): Normal. Both A1 segments are present. Patent anterior communicating artery (a-comm). --Middle cerebral arteries (MCA): Normal. IMPRESSION: 1. No acute intracranial abnormality. 2. 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 3. Normal intracranial MRA. Electronically Signed   By: Ulyses Jarred M.D.   On: 09/20/2020 00:53   MR Brain Wo Contrast (neuro protocol)  Result Date: 09/20/2020 CLINICAL DATA:  Dementia, fall and encephalopathy EXAM: MRI HEAD WITHOUT CONTRAST MRA HEAD WITHOUT CONTRAST TECHNIQUE: Multiplanar, multiecho pulse sequences of the brain and surrounding structures were obtained without intravenous contrast. Angiographic images of the head were obtained using MRA technique without contrast. COMPARISON:  None. 01/26/2019 FINDINGS: MRI HEAD FINDINGS Brain: No acute infarct, acute hemorrhage or extra-axial collection. Multifocal hyperintense T2-weighted signal within the white matter. Old  right parietal infarct. Generalized volume loss without a clear lobar predilection. Hemosiderin deposition at the old right parietal lobe insult. Normal midline structures. Vascular: Normal flow voids. Skull and upper cervical spine: Normal marrow signal. Sinuses/Orbits: Negative. Other: None. MRA HEAD FINDINGS POSTERIOR CIRCULATION: --Vertebral arteries: Normal V4 segments. --Inferior cerebellar arteries: Normal. --Basilar artery: Normal. --Superior cerebellar arteries: Normal. --Posterior cerebral arteries: Normal. The left PCA is predominantly supplied by the posterior communicating artery. ANTERIOR CIRCULATION: --Intracranial internal carotid arteries: Normal. --Anterior cerebral arteries (ACA): Normal. Both A1 segments are present. Patent anterior communicating artery (a-comm). --Middle cerebral arteries (MCA): Normal. IMPRESSION: 1. No acute intracranial abnormality. 2. 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 3. Normal intracranial MRA. Electronically Signed   By: Ulyses Jarred M.D.   On: 09/20/2020 00:53   DG Chest Port 1 View  Result Date: 09/19/2020 CLINICAL DATA:  Fall.  Short of breath. EXAM: PORTABLE CHEST 1 VIEW COMPARISON:  None. FINDINGS: Elevated right hemidiaphragm with right lower lobe patchy airspace disease likely atelectasis. Left lung clear. Heart size mildly enlarged. Negative for heart failure or edema. No significant pleural fluid. IMPRESSION: Elevated right hemidiaphragm with right lower lobe atelectasis. Electronically Signed   By: Franchot Gallo M.D.   On: 09/19/2020 11:59   VAS US CAROTID (at [facility] and [facility] only)  Result Date: 09/20/2020 Carotid Arterial Duplex Study Indications:       CVA. Risk Factors:      Hypertension, hyperlipidemia. Limitations        Today's exam was limited due to the patient's respiratory                    variation, the body habitus of the patient and patient                    positioning. Comparison Study:  No prior  studies. Performing Technologist: Oliver Hum RVT  Examination Guidelines: A complete evaluation includes B-mode imaging, spectral Doppler, color Doppler, and power Doppler as needed of all  accessible portions of each vessel. Bilateral testing is considered an integral part of a complete examination. Limited examinations for reoccurring indications may be performed as noted.  Right Carotid Findings: +----------+--------+--------+--------+-----------------------+--------+           PSV cm/sEDV cm/sStenosisPlaque Description     Comments +----------+--------+--------+--------+-----------------------+--------+ CCA Prox  66      9               smooth and heterogenous         +----------+--------+--------+--------+-----------------------+--------+ CCA Distal51      9               smooth and heterogenous         +----------+--------+--------+--------+-----------------------+--------+ ICA Prox  56      7               smooth and heterogenous         +----------+--------+--------+--------+-----------------------+--------+ ICA Distal36      12                                     tortuous +----------+--------+--------+--------+-----------------------+--------+ ECA       77      6                                               +----------+--------+--------+--------+-----------------------+--------+ +----------+--------+-------+--------+-------------------+           PSV cm/sEDV cmsDescribeArm Pressure (mmHG) +----------+--------+-------+--------+-------------------+ [id number]                                        +----------+--------+-------+--------+-------------------+ +---------+--------+--+--------+-+---------+ VertebralPSV cm/s34EDV cm/s7Antegrade +---------+--------+--+--------+-+---------+  Left Carotid Findings: +----------+--------+--------+--------+-----------------------+--------+           PSV cm/sEDV cm/sStenosisPlaque Description      Comments +----------+--------+--------+--------+-----------------------+--------+ CCA Prox  91      15              smooth and heterogenous         +----------+--------+--------+--------+-----------------------+--------+ CCA Distal64      16              smooth and heterogenous         +----------+--------+--------+--------+-----------------------+--------+ ICA Prox  48      12              smooth and heterogenous         +----------+--------+--------+--------+-----------------------+--------+ ICA Distal55      16                                     tortuous +----------+--------+--------+--------+-----------------------+--------+ ECA       85      12                                              +----------+--------+--------+--------+-----------------------+--------+ +----------+--------+--------+--------+-------------------+           PSV cm/sEDV cm/sDescribeArm Pressure (mmHG) +----------+--------+--------+--------+-------------------+ Subclavian162                                         +----------+--------+--------+--------+-------------------+ +---------+--------+--+--------+--+---------+  VertebralPSV cm/s68EDV cm/s11Antegrade +---------+--------+--+--------+--+---------+   Summary: Right Carotid: Velocities in the right ICA are consistent with a 1-39% stenosis. Left Carotid: Velocities in the left ICA are consistent with a 1-39% stenosis. Vertebrals: Bilateral vertebral arteries demonstrate antegrade flow. *See table(s) above for measurements and observations.     Preliminary     Scheduled Meds: . apixaban  5 mg Oral BID  . diltiazem  30 mg Oral Q6H  . influenza vaccine adjuvanted  0.5 mL Intramuscular Tomorrow-1000  . memantine  10 mg Oral BID  . rosuvastatin  10 mg Oral Daily  . valACYclovir  500 mg Oral Daily  . vitamin B-12  1,000 mcg Oral Daily   Continuous Infusions: . sodium chloride 75 mL/hr at 09/19/20 1659  . diltiazem (CARDIZEM)  infusion 5 mg/hr (09/19/20 1325)     LOS: 1 day   Time spent: 77 min   Darliss Cheney, MD [facility]  09/20/2020, 1:41 PM   To contact the attending provider between 7A-7P or the covering provider during after hours 7P-7A, please log into the web site [email or web address].

## 2020-09-20 NOTE — Progress Notes (Signed)
ANTICOAGULATION CONSULT NOTE - Initial Consult  Pharmacy Consult for Apixaban Indication: new onset atrial fibrillation  Allergies  Allergen Reactions  . Amoxicillin Nausea Only  . Ceclor [Cefaclor] Hives  . Levaquin [Levofloxacin] Other (See Comments)  . Nystatin Other (See Comments)    Black scale around teeth.    Patient Measurements:  Previous weight 88.5 kg  Vital Signs: Temp: 98.1 F (36.7 C) (10/16 1211) Temp Source: Oral (10/16 1211) BP: 115/70 (10/16 1211) Pulse Rate: 76 (10/16 1211)  Labs: Recent Labs    09/19/20 1200 09/19/20 1540 09/20/20 0418  HGB 16.6*  --  14.5  HCT 48.3*  --  43.3  PLT 220  --  191  APTT  --  29  --   LABPROT  --  13.3  --   INR  --  1.1  --   CREATININE 0.84  --  0.92  TROPONINIHS 61* 50*  --     CrCl cannot be calculated (Unknown ideal weight.).   Medical History: Past Medical History:  Diagnosis Date  . Bilateral bunions    hammer toe  . Cervical os stenosis   . Gallstones   . Hemangioma of liver    probable  . Hemorrhoids   . Hyperlipemia   . Hypertension   . Insomnia   . Left bundle branch block   . Left bundle branch block   . Low back pain   . Memory loss   . Plantar fasciitis   . Rectocele   . Renal cyst     Assessment: 79 yo female with dementia presents with increased confusion and a recent fall. Found to have a stroke and new onset afib. Pharmacy consulted to start patient on apixaban. Last known weight 88 kg and Scr 0.92. Patient qualifies for full dose.    Goal of Therapy:  Prevention for stroke Monitor platelets by anticoagulation protocol: Yes   Plan:  Apixaban 5mg  PO BID Monitor CBC and bleeding.   Yunique Dearcos A. Levada Dy, PharmD, BCPS, FNKF Clinical Pharmacist [place] Please utilize Amion for appropriate phone number to reach the unit pharmacist ([place])   09/20/2020,1:10 PM

## 2020-09-20 NOTE — Progress Notes (Signed)
Carotid artery duplex has been completed. Preliminary results can be found in CV Proc through chart review.   09/20/20 11:59 AM Carlos Levering RVT

## 2020-09-21 DIAGNOSIS — I4891 Unspecified atrial fibrillation: Secondary | ICD-10-CM | POA: Diagnosis not present

## 2020-09-21 DIAGNOSIS — I639 Cerebral infarction, unspecified: Secondary | ICD-10-CM | POA: Diagnosis not present

## 2020-09-21 LAB — URINE CULTURE: Culture: >=100000 — AB

## 2020-09-21 LAB — CBC WITH DIFFERENTIAL/PLATELET
Abs Immature Granulocytes: 0.05 10*3/uL (ref 0.00–0.07)
Basophils Absolute: 0 10*3/uL (ref 0.0–0.1)
Basophils Relative: 0 %
Eosinophils Absolute: 0.1 10*3/uL (ref 0.0–0.5)
Eosinophils Relative: 1 %
HCT: 42.3 % (ref 36.0–46.0)
Hemoglobin: 13.9 g/dL (ref 12.0–15.0)
Immature Granulocytes: 0 %
Lymphocytes Relative: 19 %
Lymphs Abs: 2.2 10*3/uL (ref 0.7–4.0)
MCH: 31.9 pg (ref 26.0–34.0)
MCHC: 32.9 g/dL (ref 30.0–36.0)
MCV: 97 fL (ref 80.0–100.0)
Monocytes Absolute: 1.1 10*3/uL — ABNORMAL HIGH (ref 0.1–1.0)
Monocytes Relative: 10 %
Neutro Abs: 8 10*3/uL — ABNORMAL HIGH (ref 1.7–7.7)
Neutrophils Relative %: 70 %
Platelets: 213 10*3/uL (ref 150–400)
RBC: 4.36 MIL/uL (ref 3.87–5.11)
RDW: 12.8 % (ref 11.5–15.5)
WBC: 11.4 10*3/uL — ABNORMAL HIGH (ref 4.0–10.5)
nRBC: 0 % (ref 0.0–0.2)

## 2020-09-21 LAB — TSH: TSH: 0.923 u[IU]/mL (ref 0.350–4.500)

## 2020-09-21 LAB — BASIC METABOLIC PANEL
Anion gap: 9 (ref 5–15)
BUN: 19 mg/dL (ref 8–23)
CO2: 23 mmol/L (ref 22–32)
Calcium: 9.2 mg/dL (ref 8.9–10.3)
Chloride: 107 mmol/L (ref 98–111)
Creatinine, Ser: 0.84 mg/dL (ref 0.44–1.00)
GFR, Estimated: >60 mL/min (ref >60)
Glucose, Bld: 111 mg/dL — ABNORMAL HIGH (ref 70–99)
Potassium: 3.9 mmol/L (ref 3.5–5.1)
Sodium: 139 mmol/L (ref 135–145)

## 2020-09-21 MED ORDER — METOPROLOL TARTRATE 25 MG PO TABS
25.0000 mg | ORAL_TABLET | Freq: Two times a day (BID) | ORAL | Status: DC
  Administered 2020-09-21 (×2): 25 mg via ORAL
  Filled 2020-09-21 (×2): qty 1

## 2020-09-21 MED ORDER — METOPROLOL TARTRATE 5 MG/5ML IV SOLN
5.0000 mg | Freq: Once | INTRAVENOUS | Status: AC
  Administered 2020-09-21: 5 mg via INTRAVENOUS
  Filled 2020-09-21: qty 5

## 2020-09-21 NOTE — Progress Notes (Signed)
PROGRESS NOTE    Jennifer Arroyo  LZJ:[id number] DOB: [birth year] DOA: 09/19/2020 PCP: Kathyrn Lass, MD   Brief Narrative:  Jennifer Arroyo is a 79 y.o. female with medical history significant of dementia/memory loss, hypertension, hyperlipidemia, history of left bundle branch block who presented to the ED with falls. Per husband over the past few days patient has been a little bit of/confused from her usual baseline which has been ongoing for the past 5 days with associated falls.  Patient had a fall 5 days prior to admission as well as a fall the morning of admission.  Patient did not hit her head or lose consciousness.  Patient has not mentioned any other complaint.  Patient seen in the ED and on initial presentation was noted to be in A. fib with RVR with heart rates in the 160s and subsequently placed on a Cardizem drip.  Initial set of troponin was 61.  Lactic acid level of 1.4.    Most of the labs were unremarkable except mild hypokalemia. Influenza a and B and SARS coronavirus 2 PCR negative.  CT head with right parietal hypodensity concerning for subacute infarct, ill-defined focal hypodensity involving the left corona radiator may represent an age-indeterminate infarct versus chronic microvascular ischemic changes.  Chest x-ray negative for any acute infiltrate.  Urinalysis clear, many bacteria, 21-50 WBCs.  EKG with A. fib with RVR with heart rates of 164.  Patient placed on a Cardizem drip.  Patient given a full dose aspirin.  Admitted to hospitalist service and transfer to [facility] for neurology consultation, MRI and further work-up.  Neurology recommended holding anticoagulation until hemorrhage ruled out and MRI.  MRI brain showed no acute intracranial abnormality and normal intracranial MRA.  Seen by neurology again and started on Eliquis.  Cardiology has also been consulted for new onset atrial fibrillation which is now under control.  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  CT, MRI and MRA brain as below.  Carotid Doppler unremarkable.  2D echo pending.  Hemoglobin A1c pending.  Neurology started on Eliquis and aspirin discontinued.  Seen by PT OT and they recommend home health PT.  2.  New onset/newly discovered A. Fib( CHA2DS2VASC score > 2): Patient needed to be A. fib with RVR on presentation with heart rates in the 160s and placed on a Cardizem drip.  Subsequently switched to oral Cardizem 30 mg every 6 hours and she remained under control for few hours until last night when she went into atrial fibrillation with RVR again where she was restarted on Cardizem drip, maximal dose.  Cardiology wants to continue this until tomorrow.  She was also started on beta-blocker.  TSH is still pending for last 2 days.  Will reorder today.   3.  Essential hypertension: On the low side.  She is on Cardizem drip and Lopressor.  4.  Hyperlipidemia: Started on Crestor 10 mg by neurology.  5.  Dementia/memory loss: Continue current medications.  6.  Asymptomatic bacteriuria: No antibiotic indicated.   DVT prophylaxis:    Code Status: DNR  Family Communication: Husband present at the bedside.  Plan of care discussed with both patient and her  husband.  Status is: Inpatient  Remains inpatient appropriate because:Inpatient level of care appropriate due to severity of illness   Dispo: The patient is from: Home              Anticipated d/c is to: Home              Anticipated d/c date is: 1 day              Patient currently is not medically stable to d/c.        Estimated body mass index is 31.47 kg/m as calculated from the following:   Height as  of 03/27/20: 5\' 6"  (1.676 m).   Weight as of 03/27/20: 88.5 kg.      Nutritional status:               Consultants:   Neurology and cardiology  Procedures:   None  Antimicrobials:  Anti-infectives (From admission, onward)   Start     Dose/Rate Route Frequency Ordered Stop   09/20/20 1000  valACYclovir (VALTREX) tablet 500 mg        500 mg Oral Daily 09/19/20 1937           Subjective: Seen and examined.  Husband at the bedside.  Patient alert and oriented and has no complaint.  Events from last night noted.  She seemed to be in sinus rhythm this morning when I saw her.  Objective: Vitals:   09/21/20 0830 09/21/20 0840 09/21/20 0930 09/21/20 1131  BP: 124/86  119/69 (!) 101/54  Pulse: (!) 114 (!) 45 77 61  Resp:    (!) 21  Temp:    97.9 F (36.6 C)  TempSrc:    Oral  SpO2:   95% 96%    Intake/Output Summary (Last 24 hours) at 09/21/2020 1417 Last data filed at 09/21/2020 1307 Gross per 24 hour  Intake 240 ml  Output 250 ml  Net -10 ml   There were no vitals filed for this visit.  Examination:  General exam: Appears calm and comfortable  Respiratory system: Clear to auscultation. Respiratory effort normal. Cardiovascular system: S1 & S2 heard, RRR. No JVD, murmurs, rubs, gallops or clicks. No pedal edema. Gastrointestinal system: Abdomen is nondistended, soft and nontender. No organomegaly or masses felt. Normal bowel sounds heard. Central nervous system: Alert and oriented. No focal neurological deficits. Extremities: Symmetric 5 x 5 power. Skin: No rashes, lesions or ulcers.    Data Reviewed: I have personally reviewed following labs and imaging studies  CBC: Recent Labs  Lab 09/19/20 1200 09/20/20 0418 09/21/20 0151  WBC 16.5* 12.2* 11.4*  NEUTROABS 12.8* 9.0* 8.0*  HGB 16.6* 14.5 13.9  HCT 48.3* 43.3 42.3  MCV 95.5 97.1 97.0  PLT 220 191 627   Basic Metabolic Panel: Recent Labs  Lab 09/19/20 1200 09/20/20 0418 09/21/20 0151    NA 140 140 139  K 3.2* 4.2 3.9  CL 104 107 107  CO2 23 23 23   GLUCOSE 143* 105* 111*  BUN 28* 22 19  CREATININE 0.84 0.92 0.84  CALCIUM 10.2 9.4 9.2  MG 2.4 2.0  --    GFR: CrCl cannot be calculated (Unknown ideal weight.). Liver Function Tests: Recent Labs  Lab 09/19/20 1200  AST 27  ALT 23  ALKPHOS 67  BILITOT 1.3*  PROT 7.7  ALBUMIN 3.8   No results for input(s): LIPASE, AMYLASE in the last 168 hours. No results for input(s): AMMONIA in the last 168 hours. Coagulation Profile:  Recent Labs  Lab 09/19/20 1540  INR 1.1   Cardiac Enzymes: No results for input(s): CKTOTAL, CKMB, CKMBINDEX, TROPONINI in the last 168 hours. BNP (last 3 results) No results for input(s): PROBNP in the last 8760 hours. HbA1C: No results for input(s): HGBA1C in the last 72 hours. CBG: No results for input(s): GLUCAP in the last 168 hours. Lipid Profile: Recent Labs    09/20/20 0418  CHOL 135  HDL 46  LDLCALC 71  TRIG 88  CHOLHDL 2.9   Thyroid Function Tests: Recent Labs    09/19/20 1308  TSH 0.687   Anemia Panel: No results for input(s): VITAMINB12, FOLATE, FERRITIN, TIBC, IRON, RETICCTPCT in the last 72 hours. Sepsis Labs: Recent Labs  Lab 09/19/20 1308  LATICACIDVEN 1.3  1.4    Recent Results (from the past 240 hour(s))  Urine culture     Status: Abnormal   Collection Time: 09/19/20 12:00 PM   Specimen: Urine, Clean Catch  Result Value Ref Range Status   Specimen Description   Final    URINE, CLEAN CATCH Performed at [facility], [place] [address], [place], [place] [address]    Special Requests   Final    NONE Performed at [facility], [place] [address], [place], [place] [address]    Culture >=100,000 COLONIES/mL ENTEROCOCCUS FAECALIS (A)  Final   Report Status 09/21/2020 FINAL  Final   Organism ID, Bacteria ENTEROCOCCUS FAECALIS (A)  Final      Susceptibility   Enterococcus faecalis - MIC*    AMPICILLIN <=2 SENSITIVE  Sensitive     NITROFURANTOIN <=16 SENSITIVE Sensitive     VANCOMYCIN 2 SENSITIVE Sensitive     * >=100,000 COLONIES/mL ENTEROCOCCUS FAECALIS  Blood culture (routine x 2)     Status: None (Preliminary result)   Collection Time: 09/19/20  1:08 PM   Specimen: BLOOD RIGHT FOREARM  Result Value Ref Range Status   Specimen Description   Final    BLOOD RIGHT FOREARM Performed at [place] [address], [place], [place] [address]    Special Requests   Final    BOTTLES DRAWN AEROBIC AND ANAEROBIC Blood Culture adequate volume Performed at [place] [address] [place], [place] [address]    Culture   Final    NO GROWTH 2 DAYS Performed at [facility] [place] [address], [place], [place] [address]    Report Status PENDING  Incomplete  Blood culture (routine x 2)     Status: None (Preliminary result)   Collection Time: 09/19/20  1:08 PM   Specimen: BLOOD LEFT HAND  Result Value Ref Range Status   Specimen Description   Final    BLOOD LEFT HAND Performed at [place] [address], [place], [place] [address]    Special Requests   Final    BOTTLES DRAWN AEROBIC AND ANAEROBIC Blood Culture adequate volume Performed at [facility] [place] [address], [place], [place] [address]    Culture   Final    NO GROWTH 2 DAYS Performed at [facility] [place] [address] [place], [place] [address]    Report Status PENDING  Incomplete  Respiratory Panel by RT PCR (Flu A&B, Covid) - Nasopharyngeal Swab     Status: None   Collection Time: 09/19/20  1:24 PM   Specimen: Nasopharyngeal Swab  Result Value Ref Range Status   SARS Coronavirus 2 by RT PCR NEGATIVE NEGATIVE Final  Comment: (NOTE) SARS-CoV-2 target nucleic acids are NOT DETECTED.  The SARS-CoV-2 RNA is generally detectable in upper respiratoy specimens during the acute phase of infection. The lowest concentration of SARS-CoV-2 viral copies  this assay can detect is 131 copies/mL. A negative result does not preclude SARS-Cov-2 infection and should not be used as the sole basis for treatment or other patient management decisions. A negative result may occur with  improper specimen collection/handling, submission of specimen other than nasopharyngeal swab, presence of viral mutation(s) within the areas targeted by this assay, and inadequate number of viral copies (<131 copies/mL). A negative result must be combined with clinical observations, patient history, and epidemiological information. The expected result is Negative.  Fact Sheet for Patients:  PinkCheek.be  Fact Sheet for Healthcare Providers:  GravelBags.it  This test is no t yet approved or cleared by the [place] FDA and  has been authorized for detection and/or diagnosis of SARS-CoV-2 by FDA under an Emergency Use Authorization (EUA). This EUA will remain  in effect (meaning this test can be used) for the duration of the COVID-19 declaration under Section 564(b)(1) of the Act, 21 U.S.C. section 360bbb-3(b)(1), unless the authorization is terminated or revoked sooner.     Influenza A by PCR NEGATIVE NEGATIVE Final   Influenza B by PCR NEGATIVE NEGATIVE Final    Comment: (NOTE) The Xpert Xpress SARS-CoV-2/FLU/RSV assay is intended as an aid in  the diagnosis of influenza from Nasopharyngeal swab specimens and  should not be used as a sole basis for treatment. Nasal washings and  aspirates are unacceptable for Xpert Xpress SARS-CoV-2/FLU/RSV  testing.  Fact Sheet for Patients: PinkCheek.be  Fact Sheet for Healthcare Providers: GravelBags.it  This test is not yet approved or cleared by the [place] FDA and  has been authorized for detection and/or diagnosis of SARS-CoV-2 by  FDA under an Emergency Use Authorization (EUA). This EUA  will remain  in effect (meaning this test can be used) for the duration of the  Covid-19 declaration under Section 564(b)(1) of the Act, 21  U.S.C. section 360bbb-3(b)(1), unless the authorization is  terminated or revoked. Performed at [facility], [place] [address] [place], [place] [address]       Radiology Studies: MR ANGIO HEAD WO CONTRAST  Result Date: 09/20/2020 CLINICAL DATA:  Dementia, fall and encephalopathy EXAM: MRI HEAD WITHOUT CONTRAST MRA HEAD WITHOUT CONTRAST TECHNIQUE: Multiplanar, multiecho pulse sequences of the brain and surrounding structures were obtained without intravenous contrast. Angiographic images of the head were obtained using MRA technique without contrast. COMPARISON:  None. 01/26/2019 FINDINGS: MRI HEAD FINDINGS Brain: No acute infarct, acute hemorrhage or extra-axial collection. Multifocal hyperintense T2-weighted signal within the white matter. Old right parietal infarct. Generalized volume loss without a clear lobar predilection. Hemosiderin deposition at the old right parietal lobe insult. Normal midline structures. Vascular: Normal flow voids. Skull and upper cervical spine: Normal marrow signal. Sinuses/Orbits: Negative. Other: None. MRA HEAD FINDINGS POSTERIOR CIRCULATION: --Vertebral arteries: Normal V4 segments. --Inferior cerebellar arteries: Normal. --Basilar artery: Normal. --Superior cerebellar arteries: Normal. --Posterior cerebral arteries: Normal. The left PCA is predominantly supplied by the posterior communicating artery. ANTERIOR CIRCULATION: --Intracranial internal carotid arteries: Normal. --Anterior cerebral arteries (ACA): Normal. Both A1 segments are present. Patent anterior communicating artery (a-comm). --Middle cerebral arteries (MCA): Normal. IMPRESSION: 1. No acute intracranial abnormality. 2. 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 3. Normal intracranial MRA. Electronically Signed   By:  Cletus Gash.D.  On: 09/20/2020 00:53   MR Brain Wo Contrast (neuro protocol)  Result Date: 09/20/2020 CLINICAL DATA:  Dementia, fall and encephalopathy EXAM: MRI HEAD WITHOUT CONTRAST MRA HEAD WITHOUT CONTRAST TECHNIQUE: Multiplanar, multiecho pulse sequences of the brain and surrounding structures were obtained without intravenous contrast. Angiographic images of the head were obtained using MRA technique without contrast. COMPARISON:  None. 01/26/2019 FINDINGS: MRI HEAD FINDINGS Brain: No acute infarct, acute hemorrhage or extra-axial collection. Multifocal hyperintense T2-weighted signal within the white matter. Old right parietal infarct. Generalized volume loss without a clear lobar predilection. Hemosiderin deposition at the old right parietal lobe insult. Normal midline structures. Vascular: Normal flow voids. Skull and upper cervical spine: Normal marrow signal. Sinuses/Orbits: Negative. Other: None. MRA HEAD FINDINGS POSTERIOR CIRCULATION: --Vertebral arteries: Normal V4 segments. --Inferior cerebellar arteries: Normal. --Basilar artery: Normal. --Superior cerebellar arteries: Normal. --Posterior cerebral arteries: Normal. The left PCA is predominantly supplied by the posterior communicating artery. ANTERIOR CIRCULATION: --Intracranial internal carotid arteries: Normal. --Anterior cerebral arteries (ACA): Normal. Both A1 segments are present. Patent anterior communicating artery (a-comm). --Middle cerebral arteries (MCA): Normal. IMPRESSION: 1. No acute intracranial abnormality. 2. 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 3. Normal intracranial MRA. Electronically Signed   By: Ulyses Jarred M.D.   On: 09/20/2020 00:53   ECHOCARDIOGRAM COMPLETE  Result Date: 09/20/2020    ECHOCARDIOGRAM REPORT   Patient Name:   JAMIELYN PETRUCCI Date of Exam: 09/20/2020 Medical Rec #:  [id number]       Height:       66.0 in Accession #:    [id number]      Weight:       195.0 lb  Date of Birth:  [birth year]       BSA:          1.978 m Patient Age:    47 years        BP:           127/68 mmHg Patient Gender: F               HR:           76 bpm. Exam Location:  Inpatient Procedure: 2D Echo, Cardiac Doppler, Color Doppler and Intracardiac            Opacification Agent Indications:    Atrial fibrillation                 Stroke  History:        Patient has no prior history of Echocardiogram examinations.                 Arrythmias:Atrial Fibrillation and LBBB; Risk                 Factors:Dyslipidemia, Hypertension and Former Smoker.  Sonographer:    Clayton Lefort RDCS (AE) Referring Phys: [id number] Eugenie Filler  Sonographer Comments: Suboptimal apical window. IMPRESSIONS  1. Left ventricular ejection fraction, by estimation, is 60 to 65%. The left ventricle has normal function. The left ventricle has no regional wall motion abnormalities. There is mild left ventricular hypertrophy. Left ventricular diastolic parameters were normal.  2. Right ventricular systolic function is normal. The right ventricular size is normal. Mildly increased right ventricular wall thickness. There is normal pulmonary artery systolic pressure.  3. The mitral valve is normal in structure. No evidence of mitral valve regurgitation. No evidence of mitral stenosis.  4. The aortic valve is grossly normal. There is mild calcification of the  aortic valve. Aortic valve regurgitation is not visualized. No aortic stenosis is present.  5. The inferior vena cava is normal in size with greater than 50% respiratory variability, suggesting right atrial pressure of 3 mmHg.  6. Increased flow velocities may be secondary to anemia, thyrotoxicosis, hyperdynamic or high flow state. FINDINGS  Left Ventricle: Left ventricular ejection fraction, by estimation, is 60 to 65%. The left ventricle has normal function. The left ventricle has no regional wall motion abnormalities. The left ventricular internal cavity size was normal in size. There is   mild left ventricular hypertrophy. Abnormal (paradoxical) septal motion, consistent with left bundle branch block. Left ventricular diastolic parameters were normal. Right Ventricle: The right ventricular size is normal. Mildly increased right ventricular wall thickness. Right ventricular systolic function is normal. There is normal pulmonary artery systolic pressure. The tricuspid regurgitant velocity is 2.07 m/s, and with an assumed right atrial pressure of 3 mmHg, the estimated right ventricular systolic pressure is 44.0 mmHg. Left Atrium: Left atrial size was normal in size. Right Atrium: Right atrial size was normal in size. Pericardium: There is no evidence of pericardial effusion. Presence of pericardial fat pad. Mitral Valve: The mitral valve is normal in structure. No evidence of mitral valve regurgitation. No evidence of mitral valve stenosis. Tricuspid Valve: The tricuspid valve is normal in structure. Tricuspid valve regurgitation is trivial. No evidence of tricuspid stenosis. Aortic Valve: The aortic valve is grossly normal. There is mild calcification of the aortic valve. Aortic valve regurgitation is not visualized. No aortic stenosis is present. Aortic valve mean gradient measures 12.0 mmHg. Aortic valve peak gradient measures 21.5 mmHg. Aortic valve area, by VTI measures 2.80 cm. Pulmonic Valve: The pulmonic valve was normal in structure. Pulmonic valve regurgitation is trivial. No evidence of pulmonic stenosis. Aorta: The aortic root is normal in size and structure. Venous: The inferior vena cava is normal in size with greater than 50% respiratory variability, suggesting right atrial pressure of 3 mmHg. IAS/Shunts: No atrial level shunt detected by color flow Doppler.  LEFT VENTRICLE PLAX 2D LVIDd:         3.80 cm LVIDs:         2.60 cm LV PW:         1.20 cm LV IVS:        1.30 cm LVOT diam:     1.90 cm LV SV:         130 LV SV Index:   66 LVOT Area:     2.84 cm  RIGHT VENTRICLE             IVC  RV Basal diam:  2.90 cm     IVC diam: 0.70 cm RV S prime:     17.40 cm/s TAPSE (M-mode): 2.3 cm LEFT ATRIUM             Index       RIGHT ATRIUM           Index LA diam:        2.60 cm 1.31 cm/m  RA Area:     13.90 cm LA Vol (A2C):   41.2 ml 20.82 ml/m RA Volume:   31.00 ml  15.67 ml/m LA Vol (A4C):   54.3 ml 27.45 ml/m LA Biplane Vol: 48.3 ml 24.41 ml/m  AORTIC VALVE AV Area (Vmax):    2.76 cm AV Area (Vmean):   2.71 cm AV Area (VTI):     2.80 cm AV Vmax:  232.00 cm/s AV Vmean:          162.000 cm/s AV VTI:            0.465 m AV Peak Grad:      21.5 mmHg AV Mean Grad:      12.0 mmHg LVOT Vmax:         226.00 cm/s LVOT Vmean:        155.000 cm/s LVOT VTI:          0.459 m LVOT/AV VTI ratio: 0.99  AORTA Ao Root diam: 2.80 cm Ao Asc diam:  3.10 cm TRICUSPID VALVE TR Peak grad:   17.1 mmHg TR Vmax:        207.00 cm/s  SHUNTS Systemic VTI:  0.46 m Systemic Diam: 1.90 cm Cherlynn Kaiser MD Electronically signed by Cherlynn Kaiser MD Signature Date/Time: 09/20/2020/4:34:23 PM    Final    VAS US CAROTID (at [facility] and [facility] only)  Result Date: 09/20/2020 Carotid Arterial Duplex Study Indications:       CVA. Risk Factors:      Hypertension, hyperlipidemia. Limitations        Today's exam was limited due to the patient's respiratory                    variation, the body habitus of the patient and patient                    positioning. Comparison Study:  No prior studies. Performing Technologist: Oliver Hum RVT  Examination Guidelines: A complete evaluation includes B-mode imaging, spectral Doppler, color Doppler, and power Doppler as needed of all accessible portions of each vessel. Bilateral testing is considered an integral part of a complete examination. Limited examinations for reoccurring indications may be performed as noted.  Right Carotid Findings: +----------+--------+--------+--------+-----------------------+--------+           PSV cm/sEDV cm/sStenosisPlaque Description     Comments  +----------+--------+--------+--------+-----------------------+--------+ CCA Prox  66      9               smooth and heterogenous         +----------+--------+--------+--------+-----------------------+--------+ CCA Distal51      9               smooth and heterogenous         +----------+--------+--------+--------+-----------------------+--------+ ICA Prox  56      7               smooth and heterogenous         +----------+--------+--------+--------+-----------------------+--------+ ICA Distal36      12                                     tortuous +----------+--------+--------+--------+-----------------------+--------+ ECA       77      6                                               +----------+--------+--------+--------+-----------------------+--------+ +----------+--------+-------+--------+-------------------+           PSV cm/sEDV cmsDescribeArm Pressure (mmHG) +----------+--------+-------+--------+-------------------+ [id number]                                        +----------+--------+-------+--------+-------------------+ +---------+--------+--+--------+-+---------+  VertebralPSV cm/s34EDV cm/s7Antegrade +---------+--------+--+--------+-+---------+  Left Carotid Findings: +----------+--------+--------+--------+-----------------------+--------+           PSV cm/sEDV cm/sStenosisPlaque Description     Comments +----------+--------+--------+--------+-----------------------+--------+ CCA Prox  91      15              smooth and heterogenous         +----------+--------+--------+--------+-----------------------+--------+ CCA Distal64      16              smooth and heterogenous         +----------+--------+--------+--------+-----------------------+--------+ ICA Prox  48      12              smooth and heterogenous         +----------+--------+--------+--------+-----------------------+--------+ ICA Distal55      16                                      tortuous +----------+--------+--------+--------+-----------------------+--------+ ECA       85      12                                              +----------+--------+--------+--------+-----------------------+--------+ +----------+--------+--------+--------+-------------------+           PSV cm/sEDV cm/sDescribeArm Pressure (mmHG) +----------+--------+--------+--------+-------------------+ Subclavian162                                         +----------+--------+--------+--------+-------------------+ +---------+--------+--+--------+--+---------+ VertebralPSV cm/s68EDV cm/s11Antegrade +---------+--------+--+--------+--+---------+   Summary: Right Carotid: Velocities in the right ICA are consistent with a 1-39% stenosis. Left Carotid: Velocities in the left ICA are consistent with a 1-39% stenosis. Vertebrals: Bilateral vertebral arteries demonstrate antegrade flow. *See table(s) above for measurements and observations.  Electronically signed by Monica Martinez MD on 09/20/2020 at 3:32:48 PM.    Final     Scheduled Meds: . apixaban  5 mg Oral BID  . influenza vaccine adjuvanted  0.5 mL Intramuscular Tomorrow-1000  . memantine  10 mg Oral BID  . metoprolol tartrate  25 mg Oral BID  . rosuvastatin  10 mg Oral Daily  . valACYclovir  500 mg Oral Daily  . vitamin B-12  1,000 mcg Oral Daily   Continuous Infusions: . sodium chloride 75 mL/hr at 09/19/20 1659  . diltiazem (CARDIZEM) infusion 5 mg/hr (09/21/20 1151)     LOS: 2 days   Time spent: 79 min   Darliss Cheney, MD [facility]  09/21/2020, 2:17 PM   To contact the attending provider between 7A-7P or the covering provider during after hours 7P-7A, please log into the web site [email or web address].

## 2020-09-21 NOTE — Progress Notes (Signed)
   09/21/20 0730  Assess: MEWS Score  Temp 98 F (36.7 C)  BP 113/88  Pulse Rate (!) 155 (Dilt gtt @15 )  Resp 20  Level of Consciousness Alert  SpO2 90 %  O2 Device Nasal Cannula  O2 Flow Rate (L/min) 2 L/min  Assess: MEWS Score  MEWS Temp 0  MEWS Systolic 0  MEWS Pulse 3  MEWS RR 0  MEWS LOC 0  MEWS Score 3  MEWS Score Color Yellow  Assess: if the MEWS score is Yellow or Red  Were vital signs taken at a resting state? Yes  Focused Assessment Change from prior assessment (see assessment flowsheet)  Early Detection of Sepsis Score *See Row Information* Low  MEWS guidelines implemented *See Row Information* Yes  Treat  Pain Scale 0-10  Pain Score 0  Take Vital Signs  Increase Vital Sign Frequency  Yellow: Q 2hr X 2 then Q 4hr X 2, if remains yellow, continue Q 4hrs  Escalate  MEWS: Escalate Yellow: discuss with charge nurse/RN and consider discussing with provider and RRT  Notify: Charge Nurse/RN  Name of Charge Nurse/RN Notified Angela, RN  Date Charge Nurse/RN Notified 09/21/20  Time Charge Nurse/RN Notified 0735  Notify: Provider  Provider Name/Title [facility]  Date Provider Notified 09/21/20  Time Provider Notified [phone]  Notification Type Page  Notification Reason Other (Comment) (HR 140's-160's)  Response See new orders  Date of Provider Response 09/21/20  Time of Provider Response [phone]

## 2020-09-21 NOTE — Progress Notes (Signed)
Progress Note  Patient Name: Jennifer Arroyo Date of Encounter: 09/21/2020  [facility] Cardiologist: No primary care provider on file.   Subjective  Patient feels well. Denies palpitations. No shortness of breath or urinary symptoms.  Patient developed recurrent Afib with RVR with HR 140-150s despite dilt gtt at 15. Given metop 5mg  IV and started on metop 25mg  BID. Back in SR now.   TTE with normal BiV function, no significant valvular disease Inpatient Medications    Scheduled Meds: . apixaban  5 mg Oral BID  . influenza vaccine adjuvanted  0.5 mL Intramuscular Tomorrow-1000  . memantine  10 mg Oral BID  . metoprolol tartrate  25 mg Oral BID  . rosuvastatin  10 mg Oral Daily  . valACYclovir  500 mg Oral Daily  . vitamin B-12  1,000 mcg Oral Daily   Continuous Infusions: . sodium chloride 75 mL/hr at 09/19/20 1659  . diltiazem (CARDIZEM) infusion 5 mg/hr (09/21/20 1151)   PRN Meds: acetaminophen **OR** acetaminophen (TYLENOL) oral liquid 160 mg/5 mL **OR** acetaminophen, ondansetron (ZOFRAN) IV, senna-docusate   Vital Signs    Vitals:   09/21/20 0830 09/21/20 0840 09/21/20 0930 09/21/20 1131  BP: 124/86  119/69 (!) 101/54  Pulse: (!) 114 (!) 45 77 61  Resp:    (!) 21  Temp:    97.9 F (36.6 C)  TempSrc:    Oral  SpO2:   95% 96%    Intake/Output Summary (Last 24 hours) at 09/21/2020 1311 Last data filed at 09/21/2020 1307 Gross per 24 hour  Intake 240 ml  Output 250 ml  Net -10 ml   Last 3 Weights 03/27/2020 01/11/2019  Weight (lbs) 195 lb 171 lb 8 oz  Weight (kg) 88.451 kg 77.792 kg      Telemetry    Afib with RVR this AM; now back in NSR with occasional PVCs - Personally Reviewed  ECG    No new ECG - Personally Reviewed  Physical Exam   GEN: Comfortable, NAD  Neck: No JVD Cardiac: RRR, no murmurs, rubs, or gallops.  Respiratory: Clear to auscultation bilaterally. GI: Soft, nontender, non-distended  MS: No edema; No deformity. Neuro:   Nonfocal  Psych: Normal affect   Labs    High Sensitivity Troponin:   Recent Labs  Lab 09/19/20 1200 09/19/20 1540  TROPONINIHS 61* 50*      Chemistry Recent Labs  Lab 09/19/20 1200 09/20/20 0418 09/21/20 0151  NA 140 140 139  K 3.2* 4.2 3.9  CL 104 107 107  CO2 23 23 23   GLUCOSE 143* 105* 111*  BUN 28* 22 19  CREATININE 0.84 0.92 0.84  CALCIUM 10.2 9.4 9.2  PROT 7.7  --   --   ALBUMIN 3.8  --   --   AST 27  --   --   ALT 23  --   --   ALKPHOS 67  --   --   BILITOT 1.3*  --   --   GFRNONAA >60 59* >60  ANIONGAP 13 10 9      Hematology Recent Labs  Lab 09/19/20 1200 09/20/20 0418 09/21/20 0151  WBC 16.5* 12.2* 11.4*  RBC 5.06 4.46 4.36  HGB 16.6* 14.5 13.9  HCT 48.3* 43.3 42.3  MCV 95.5 97.1 97.0  MCH 32.8 32.5 31.9  MCHC 34.4 33.5 32.9  RDW 13.0 13.0 12.8  PLT 220 191 213    BNPNo results for input(s): BNP, PROBNP in the last 168 hours.   DDimer  No results for input(s): DDIMER in the last 168 hours.   Radiology    CT Head Wo Contrast  Addendum Date: 09/19/2020   ADDENDUM REPORT: 09/19/2020 14:36 ADDENDUM: These results were called by telephone at the time of interpretation on 09/19/2020 at 2:26 pm to provider [facility] PATEL , who verbally acknowledged these results. Electronically Signed   By: Primitivo Gauze M.D.   On: 09/19/2020 14:36   Result Date: 09/19/2020 CLINICAL DATA:  Neuro deficit, stroke suspected. EXAM: CT HEAD WITHOUT CONTRAST TECHNIQUE: Contiguous axial images were obtained from the base of the skull through the vertex without intravenous contrast. COMPARISON:  01/26/2019 MRI head. FINDINGS: Brain: New hypodensity involving the right parietal lobe with extension to the cortex is concerning for subacute infarct. Diffuse parenchymal volume loss with ex vacuo dilatation. Ill-defined focal hypodensity involving the left corona radiata. No mass lesion. No midline shift, ventriculomegaly or extra-axial fluid collection. Vascular: No  hyperdense vessel or unexpected calcification. Bilateral skull base atherosclerotic calcifications. Skull: Negative for fracture or focal lesion. Sinuses/Orbits: Normal orbits. Clear paranasal sinuses. No mastoid effusion. Other: None. IMPRESSION: 1. Right parietal hypodensity is concerning for subacute infarct. 2. Ill-defined focal hypodensity involving the left corona radiata may represent an age-indeterminate infarct versus chronic microvascular ischemic changes. 3. Moderate cerebral atrophy. Electronically Signed: By: Primitivo Gauze M.D. On: 09/19/2020 14:23   MR ANGIO HEAD WO CONTRAST  Result Date: 09/20/2020 CLINICAL DATA:  Dementia, fall and encephalopathy EXAM: MRI HEAD WITHOUT CONTRAST MRA HEAD WITHOUT CONTRAST TECHNIQUE: Multiplanar, multiecho pulse sequences of the brain and surrounding structures were obtained without intravenous contrast. Angiographic images of the head were obtained using MRA technique without contrast. COMPARISON:  None. 01/26/2019 FINDINGS: MRI HEAD FINDINGS Brain: No acute infarct, acute hemorrhage or extra-axial collection. Multifocal hyperintense T2-weighted signal within the white matter. Old right parietal infarct. Generalized volume loss without a clear lobar predilection. Hemosiderin deposition at the old right parietal lobe insult. Normal midline structures. Vascular: Normal flow voids. Skull and upper cervical spine: Normal marrow signal. Sinuses/Orbits: Negative. Other: None. MRA HEAD FINDINGS POSTERIOR CIRCULATION: --Vertebral arteries: Normal V4 segments. --Inferior cerebellar arteries: Normal. --Basilar artery: Normal. --Superior cerebellar arteries: Normal. --Posterior cerebral arteries: Normal. The left PCA is predominantly supplied by the posterior communicating artery. ANTERIOR CIRCULATION: --Intracranial internal carotid arteries: Normal. --Anterior cerebral arteries (ACA): Normal. Both A1 segments are present. Patent anterior communicating artery  (a-comm). --Middle cerebral arteries (MCA): Normal. IMPRESSION: 1. No acute intracranial abnormality. 2. 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 3. Normal intracranial MRA. Electronically Signed   By: Ulyses Jarred M.D.   On: 09/20/2020 00:53   MR Brain Wo Contrast (neuro protocol)  Result Date: 09/20/2020 CLINICAL DATA:  Dementia, fall and encephalopathy EXAM: MRI HEAD WITHOUT CONTRAST MRA HEAD WITHOUT CONTRAST TECHNIQUE: Multiplanar, multiecho pulse sequences of the brain and surrounding structures were obtained without intravenous contrast. Angiographic images of the head were obtained using MRA technique without contrast. COMPARISON:  None. 01/26/2019 FINDINGS: MRI HEAD FINDINGS Brain: No acute infarct, acute hemorrhage or extra-axial collection. Multifocal hyperintense T2-weighted signal within the white matter. Old right parietal infarct. Generalized volume loss without a clear lobar predilection. Hemosiderin deposition at the old right parietal lobe insult. Normal midline structures. Vascular: Normal flow voids. Skull and upper cervical spine: Normal marrow signal. Sinuses/Orbits: Negative. Other: None. MRA HEAD FINDINGS POSTERIOR CIRCULATION: --Vertebral arteries: Normal V4 segments. --Inferior cerebellar arteries: Normal. --Basilar artery: Normal. --Superior cerebellar arteries: Normal. --Posterior cerebral arteries: Normal. The left PCA  is predominantly supplied by the posterior communicating artery. ANTERIOR CIRCULATION: --Intracranial internal carotid arteries: Normal. --Anterior cerebral arteries (ACA): Normal. Both A1 segments are present. Patent anterior communicating artery (a-comm). --Middle cerebral arteries (MCA): Normal. IMPRESSION: 1. No acute intracranial abnormality. 2. 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 3. Normal intracranial MRA. Electronically Signed   By: Ulyses Jarred M.D.   On: 09/20/2020 00:53   ECHOCARDIOGRAM  COMPLETE  Result Date: 09/20/2020    ECHOCARDIOGRAM REPORT   Patient Name:   Jennifer Arroyo Date of Exam: 09/20/2020 Medical Rec #:  [id number]       Height:       66.0 in Accession #:    [id number]      Weight:       195.0 lb Date of Birth:  09/20/1941       BSA:          1.978 m Patient Age:    79 years        BP:           127/68 mmHg Patient Gender: F               HR:           76 bpm. Exam Location:  Inpatient Procedure: 2D Echo, Cardiac Doppler, Color Doppler and Intracardiac            Opacification Agent Indications:    Atrial fibrillation                 Stroke  History:        Patient has no prior history of Echocardiogram examinations.                 Arrythmias:Atrial Fibrillation and LBBB; Risk                 Factors:Dyslipidemia, Hypertension and Former Smoker.  Sonographer:    Clayton Lefort RDCS (AE) Referring Phys: [id number] Eugenie Filler  Sonographer Comments: Suboptimal apical window. IMPRESSIONS  1. Left ventricular ejection fraction, by estimation, is 60 to 65%. The left ventricle has normal function. The left ventricle has no regional wall motion abnormalities. There is mild left ventricular hypertrophy. Left ventricular diastolic parameters were normal.  2. Right ventricular systolic function is normal. The right ventricular size is normal. Mildly increased right ventricular wall thickness. There is normal pulmonary artery systolic pressure.  3. The mitral valve is normal in structure. No evidence of mitral valve regurgitation. No evidence of mitral stenosis.  4. The aortic valve is grossly normal. There is mild calcification of the aortic valve. Aortic valve regurgitation is not visualized. No aortic stenosis is present.  5. The inferior vena cava is normal in size with greater than 50% respiratory variability, suggesting right atrial pressure of 3 mmHg.  6. Increased flow velocities may be secondary to anemia, thyrotoxicosis, hyperdynamic or high flow state. FINDINGS  Left Ventricle: Left  ventricular ejection fraction, by estimation, is 60 to 65%. The left ventricle has normal function. The left ventricle has no regional wall motion abnormalities. The left ventricular internal cavity size was normal in size. There is  mild left ventricular hypertrophy. Abnormal (paradoxical) septal motion, consistent with left bundle branch block. Left ventricular diastolic parameters were normal. Right Ventricle: The right ventricular size is normal. Mildly increased right ventricular wall thickness. Right ventricular systolic function is normal. There is normal pulmonary artery systolic pressure. The tricuspid regurgitant velocity is 2.07 m/s, and with an  assumed right atrial pressure of 3 mmHg, the estimated right ventricular systolic pressure is 34.7 mmHg. Left Atrium: Left atrial size was normal in size. Right Atrium: Right atrial size was normal in size. Pericardium: There is no evidence of pericardial effusion. Presence of pericardial fat pad. Mitral Valve: The mitral valve is normal in structure. No evidence of mitral valve regurgitation. No evidence of mitral valve stenosis. Tricuspid Valve: The tricuspid valve is normal in structure. Tricuspid valve regurgitation is trivial. No evidence of tricuspid stenosis. Aortic Valve: The aortic valve is grossly normal. There is mild calcification of the aortic valve. Aortic valve regurgitation is not visualized. No aortic stenosis is present. Aortic valve mean gradient measures 12.0 mmHg. Aortic valve peak gradient measures 21.5 mmHg. Aortic valve area, by VTI measures 2.80 cm. Pulmonic Valve: The pulmonic valve was normal in structure. Pulmonic valve regurgitation is trivial. No evidence of pulmonic stenosis. Aorta: The aortic root is normal in size and structure. Venous: The inferior vena cava is normal in size with greater than 50% respiratory variability, suggesting right atrial pressure of 3 mmHg. IAS/Shunts: No atrial level shunt detected by color flow Doppler.   LEFT VENTRICLE PLAX 2D LVIDd:         3.80 cm LVIDs:         2.60 cm LV PW:         1.20 cm LV IVS:        1.30 cm LVOT diam:     1.90 cm LV SV:         130 LV SV Index:   66 LVOT Area:     2.84 cm  RIGHT VENTRICLE             IVC RV Basal diam:  2.90 cm     IVC diam: 0.70 cm RV S prime:     17.40 cm/s TAPSE (M-mode): 2.3 cm LEFT ATRIUM             Index       RIGHT ATRIUM           Index LA diam:        2.60 cm 1.31 cm/m  RA Area:     13.90 cm LA Vol (A2C):   41.2 ml 20.82 ml/m RA Volume:   31.00 ml  15.67 ml/m LA Vol (A4C):   54.3 ml 27.45 ml/m LA Biplane Vol: 48.3 ml 24.41 ml/m  AORTIC VALVE AV Area (Vmax):    2.76 cm AV Area (Vmean):   2.71 cm AV Area (VTI):     2.80 cm AV Vmax:           232.00 cm/s AV Vmean:          162.000 cm/s AV VTI:            0.465 m AV Peak Grad:      21.5 mmHg AV Mean Grad:      12.0 mmHg LVOT Vmax:         226.00 cm/s LVOT Vmean:        155.000 cm/s LVOT VTI:          0.459 m LVOT/AV VTI ratio: 0.99  AORTA Ao Root diam: 2.80 cm Ao Asc diam:  3.10 cm TRICUSPID VALVE TR Peak grad:   17.1 mmHg TR Vmax:        207.00 cm/s  SHUNTS Systemic VTI:  0.46 m Systemic Diam: 1.90 cm Cherlynn Kaiser MD Electronically signed by Cherlynn Kaiser MD Signature Date/Time: 09/20/2020/4:34:23 PM  Final    VAS US CAROTID (at [facility] and [facility] only)  Result Date: 09/20/2020 Carotid Arterial Duplex Study Indications:       CVA. Risk Factors:      Hypertension, hyperlipidemia. Limitations        Today's exam was limited due to the patient's respiratory                    variation, the body habitus of the patient and patient                    positioning. Comparison Study:  No prior studies. Performing Technologist: Oliver Hum RVT  Examination Guidelines: A complete evaluation includes B-mode imaging, spectral Doppler, color Doppler, and power Doppler as needed of all accessible portions of each vessel. Bilateral testing is considered an integral part of a complete examination. Limited  examinations for reoccurring indications may be performed as noted.  Right Carotid Findings: +----------+--------+--------+--------+-----------------------+--------+           PSV cm/sEDV cm/sStenosisPlaque Description     Comments +----------+--------+--------+--------+-----------------------+--------+ CCA Prox  66      9               smooth and heterogenous         +----------+--------+--------+--------+-----------------------+--------+ CCA Distal51      9               smooth and heterogenous         +----------+--------+--------+--------+-----------------------+--------+ ICA Prox  56      7               smooth and heterogenous         +----------+--------+--------+--------+-----------------------+--------+ ICA Distal36      12                                     tortuous +----------+--------+--------+--------+-----------------------+--------+ ECA       77      6                                               +----------+--------+--------+--------+-----------------------+--------+ +----------+--------+-------+--------+-------------------+           PSV cm/sEDV cmsDescribeArm Pressure (mmHG) +----------+--------+-------+--------+-------------------+ [id number]                                        +----------+--------+-------+--------+-------------------+ +---------+--------+--+--------+-+---------+ VertebralPSV cm/s34EDV cm/s7Antegrade +---------+--------+--+--------+-+---------+  Left Carotid Findings: +----------+--------+--------+--------+-----------------------+--------+           PSV cm/sEDV cm/sStenosisPlaque Description     Comments +----------+--------+--------+--------+-----------------------+--------+ CCA Prox  91      15              smooth and heterogenous         +----------+--------+--------+--------+-----------------------+--------+ CCA Distal64      16              smooth and heterogenous          +----------+--------+--------+--------+-----------------------+--------+ ICA Prox  48      12              smooth and heterogenous         +----------+--------+--------+--------+-----------------------+--------+ ICA Distal55      16  tortuous +----------+--------+--------+--------+-----------------------+--------+ ECA       85      12                                              +----------+--------+--------+--------+-----------------------+--------+ +----------+--------+--------+--------+-------------------+           PSV cm/sEDV cm/sDescribeArm Pressure (mmHG) +----------+--------+--------+--------+-------------------+ Subclavian162                                         +----------+--------+--------+--------+-------------------+ +---------+--------+--+--------+--+---------+ VertebralPSV cm/s68EDV cm/s11Antegrade +---------+--------+--+--------+--+---------+   Summary: Right Carotid: Velocities in the right ICA are consistent with a 1-39% stenosis. Left Carotid: Velocities in the left ICA are consistent with a 1-39% stenosis. Vertebrals: Bilateral vertebral arteries demonstrate antegrade flow. *See table(s) above for measurements and observations.  Electronically signed by Monica Martinez MD on 09/20/2020 at 3:32:48 PM.    Final     Cardiac Studies  TTE 09/20/20 IMPRESSIONS  1. Left ventricular ejection fraction, by estimation, is 60 to 65%. The  left ventricle has normal function. The left ventricle has no regional  wall motion abnormalities. There is mild left ventricular hypertrophy.  Left ventricular diastolic parameters  were normal.  2. Right ventricular systolic function is normal. The right ventricular  size is normal. Mildly increased right ventricular wall thickness. There  is normal pulmonary artery systolic pressure.  3. The mitral valve is normal in structure. No evidence of mitral valve  regurgitation. No  evidence of mitral stenosis.  4. The aortic valve is grossly normal. There is mild calcification of the  aortic valve. Aortic valve regurgitation is not visualized. No aortic  stenosis is present.  5. The inferior vena cava is normal in size with greater than 50%  respiratory variability, suggesting right atrial pressure of 3 mmHg.  6. Increased flow velocities may be secondary to anemia, thyrotoxicosis,  hyperdynamic or high flow state.   Patient Profile     79 y.o. female with HTN, HLD, and dementia who presented to ED with several falls found to have new Afib with RVR with HR 160s for which cardiology has been consulted.  Assessment & Plan    #New Afib with RVR: CHADs-vasc 7. Patient with newly diagnosed Afib with RVR with rates 160s in ED. Now converted back to NSR with HR 80s currently. Off dilt gtt and transitioned to PO. On apixaban for anticoagulation. -Continue apixaban 5mg  BID -Continue dilt gtt for today; will stop tomorrow and try to keep on metoprolol for rate control -Continue metop 25mg  BID -Continue telemetry -TSH normal  #HTN Well controlled. -Holding home HCTZ now that on Dilt gtt -Monitor  #HLD -Continue rosuvastatin 10mg   #Mild carotid artery disease: -Continue statin -No ASA given need for [facility]  #History of prior CVA Neurology following. MRI with chronic cortical stroke.  -Carotids with minimal disease -Continue crestor 10mg  -No ASA given need for [facility]      For questions or updates, please contact [facility] Please consult [email or web address] for contact info under        Signed, Freada Bergeron, MD  09/21/2020, 1:11 PM

## 2020-09-21 NOTE — Discharge Instructions (Signed)

## 2020-09-21 NOTE — Progress Notes (Signed)
   Notified by RN that patient is back in atrial fibrillation with HR's sustained in the 140s-160s despite diltiazem gtt 15/hr. BP 113/88.   Will give IV metoprolol 5mg  now and start metoprolol tartrate 25mg  BID (with hold parameters) for improved rate control.  Rounding team to see shortly.   Abigail Butts, PA-C 09/21/20; 7:52 AM

## 2020-09-21 NOTE — Progress Notes (Signed)
Pt Hr sustaining 140's-160's Dilt gtt @ 15mg /hr. Cardiology notified. Awaiting new orders.

## 2020-09-22 DIAGNOSIS — I4891 Unspecified atrial fibrillation: Secondary | ICD-10-CM | POA: Diagnosis not present

## 2020-09-22 LAB — HEMOGLOBIN A1C
Hgb A1c MFr Bld: 5.4 % (ref 4.8–5.6)
Mean Plasma Glucose: 108 mg/dL

## 2020-09-22 MED ORDER — METOPROLOL TARTRATE 50 MG PO TABS
50.0000 mg | ORAL_TABLET | Freq: Two times a day (BID) | ORAL | Status: DC
  Administered 2020-09-22: 50 mg via ORAL
  Filled 2020-09-22: qty 1

## 2020-09-22 MED ORDER — APIXABAN 5 MG PO TABS
5.0000 mg | ORAL_TABLET | Freq: Two times a day (BID) | ORAL | 0 refills | Status: DC
Start: 2020-09-22 — End: 2020-10-21

## 2020-09-22 MED ORDER — METOPROLOL SUCCINATE ER 50 MG PO TB24
50.0000 mg | ORAL_TABLET | Freq: Every day | ORAL | 0 refills | Status: DC
Start: 2020-09-22 — End: 2021-02-12

## 2020-09-22 NOTE — Progress Notes (Signed)
Spoke with the pt's husband regarding the pt's cognition, and he stated that she is at her cognitive-linguistic baseline.  No further ST recommended at this time or at time of discharge.   Jennifer Arroyo 09/22/2020, 10:41 AM

## 2020-09-22 NOTE — TOC Transition Note (Signed)
Transition of Care [facility]) - CM/SW Discharge Note   Patient Details  Name: Jennifer Arroyo MRN: [id number] Date of Birth: 10-12-1941  [facility] CM/SW Contact:  Pollie Friar, RN Phone Number: 09/22/2020, 10:47 AM   Clinical Narrative:    Pt is discharging home with spouse and [facility] services through [facility]. Cory with [facility] accepted the referral. No DME needs.  Spouse can provide needed supervision and transportation home.   Final next level of care: Home w Home Health Services Barriers to Discharge: No Barriers Identified   Patient Goals and CMS Choice   CMS Medicare.gov Compare Post Acute Care list provided to:: Patient Represenative (must comment) Choice offered to / list presented to : Spouse  Discharge Placement                       Discharge Plan and Services                          HH Arranged: PT, OT, Nurse's Aide [place] Agency: [place] Date [facility] Agency Contacted: 09/22/20   Representative spoke with at [place]: [place] Interventions     Readmission Risk Interventions No flowsheet data found.

## 2020-09-22 NOTE — Discharge Summary (Addendum)
Physician Discharge Summary  SHALEA TOMCZAK HYI:[id number] DOB: Apr 02, 1941 DOA: 09/19/2020  PCP: Kathyrn Lass, MD  Admit date: 09/19/2020 Discharge date: 09/22/2020  Admitted From: Home Disposition: Home  Recommendations for Outpatient Follow-up:  1. Follow up with PCP in 1-2 weeks 2. Follow with cardiology in 1 to 2 weeks 3. Please obtain BMP/CBC in one week 4. Please follow up with your PCP on the following pending results: Unresulted Labs (From admission, onward)          Start     Ordered   09/20/20 0500  Hemoglobin A1c  Tomorrow morning,   R        09/19/20 1937   09/20/20 0500  TSH  Tomorrow morning,   R        09/19/20 1654           Home Health: Yes Equipment/Devices: None  Discharge Condition: Stable CODE STATUS: DNR Diet recommendation: Low-sodium/cardiac  Subjective: Seen and examined.  Husband at bedside.  She is completely alert and oriented and has no symptoms.  Brief/Interim Summary: Jennifer Arroyo a 79 y.o.femalewith medical history significant ofdementia/memory loss, hypertension, hyperlipidemia, history of left bundle branch block who presented to the ED with falls.Per husband over the past few days patient has been a little bit of/confused from her usual baseline which has been ongoing for the past 5 days with associated falls. Patient had a fall 5 days prior to admission as well as a fall the morning of admission. Patient did not hit her head or lose consciousness.  Patient has not mentioned any other complaint.  Patient seen in the ED and on initial presentation was noted to be in A. fib with RVR with heart rates in the 160s and subsequently placed on a Cardizem drip. Initial set of troponin was 61. Lactic acid level of 1.4.   Most of the labs were unremarkable except mild hypokalemia.Influenza a and B and SARS coronavirus 2 PCR negative. CT head with right parietal hypodensity concerning for subacute infarct, ill-defined focal hypodensity  involving the left corona radiator may represent an age-indeterminate infarct versus chronic microvascular ischemic changes. Chest x-ray negative for any acute infiltrate. Urinalysis clear, many bacteria, 21-50 WBCs. EKG with A. fib with RVR with heart rates of 164. Patient placed on a Cardizem drip. Patient given a full dose aspirin.  Admitted to hospitalist service and transferred to [facility] for neurology consultation, MRI and further work-up.  Neurology recommended holding anticoagulation until hemorrhage ruled out and MRI.  MRI brain showed no acute intracranial abnormality and normal intracranial MRA.  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 Seen by neurology again and started on Eliquis.  Cardiology was consulted.  Initially she was weaned from IV Cardizem to oral Cardizem and her rates were controlled for few hours and then went back to A. fib with RVR and Cardizem drip was restarted.  She converted back to sinus rhythm last night.  IV Cardizem stopped.  Cardiology saw her.  Patient remains asymptomatic.  Cardiology recommended discharging home with doubling the dose of Toprol-XL from 25mg  to 50 mg and resuming all home medications and discharging on Eliquis and no antiplatelet.  She was seen by PT OT who recommended home health which has been ordered for her.  Patient is being discharged in stable condition.  Her husband was at the bedside and plan of discharge has been discussed with him in detail.  Discharge Diagnoses:  Active Problems:   New onset a-fib (HCC)   HTN (hypertension)   Hyperlipidemia   Dementia without behavioral disturbance ([place])   Fall at home, initial encounter   Bacteria in urine    Discharge Instructions  Discharge Instructions    Ambulatory referral to Neurology   Complete by: As directed    Follow up with Dr. Krista Blue at [facility] in 4 weeks. Pt is Dr. [name] pt. Thanks.     Allergies as of 09/22/2020       Reactions   Amoxicillin Nausea Only   Ceclor [cefaclor] Hives   Levaquin [levofloxacin] Other (See Comments)   Nystatin Other (See Comments)   Black scale around teeth.      Medication List    TAKE these medications   apixaban 5 MG Tabs tablet Commonly known as: ELIQUIS Take 1 tablet (5 mg total) by mouth 2 (two) times daily.   BIOTIN PO Take 1 tablet by mouth daily.   CALCIUM 600-D PO Take 1 tablet by mouth daily.   Co Q-10 100 MG Caps Take 100 mg by mouth daily.   donepezil 10 MG tablet Commonly known as: ARICEPT Take 1 tablet (10 mg total) by mouth at bedtime.   hydrochlorothiazide 25 MG tablet Commonly known as: HYDRODIURIL Take 25 mg by mouth daily.   Magnesium 250 MG Tabs Take 750 mg by mouth daily.   memantine 10 MG tablet Commonly known as: NAMENDA Take 1 tablet (10 mg total) by mouth 2 (two) times daily. Please call [phone] to schedule appt.   metoprolol succinate 50 MG 24 hr tablet Commonly known as: TOPROL-XL Take 1 tablet (50 mg total) by mouth daily. What changed:   medication strength  how much to take   OMEGA 3 PO Take 1,200 mg by mouth daily.   rosuvastatin 10 MG tablet Commonly known as: CRESTOR Take 10 mg by mouth daily.   valACYclovir 500 MG tablet Commonly known as: VALTREX Take 500 mg by mouth daily.   VITAMIN B-12 PO Take 1 tablet by mouth daily.       Follow-up Information    Marcial Pacas, MD. Schedule an appointment as soon as possible for a visit in 4 week(s).   Specialty: Neurology Contact information: [place] [address] [phone]        Kathyrn Lass, MD Follow up in 1 week(s).   Specialty: Family Medicine Contact information: [place] [address] [phone]        Freada Bergeron, MD Follow up in 2 week(s).   Specialties: Cardiology, Radiology Contact information: [address] [place] [address] [phone]               Allergies  Allergen Reactions  . Amoxicillin Nausea Only  . Ceclor [Cefaclor] Hives  . Levaquin [Levofloxacin] Other (See Comments)  . Nystatin Other (See Comments)    Black scale around teeth.    Consultations: Cardiology   Procedures/Studies: CT Head Wo Contrast  Addendum Date: 09/19/2020   ADDENDUM REPORT: 09/19/2020 14:36 ADDENDUM: These results were called by telephone at the time of interpretation on 09/19/2020 at 2:26 pm to provider [facility] PATEL , who verbally acknowledged these results. Electronically Signed   By: Primitivo Gauze M.D.   On: 09/19/2020 14:36   Result Date: 09/19/2020 CLINICAL DATA:  Neuro deficit, stroke suspected. EXAM: CT HEAD WITHOUT CONTRAST TECHNIQUE: Contiguous axial images were obtained from the base of the skull through the vertex without intravenous  contrast. COMPARISON:  01/26/2019 MRI head. FINDINGS: Brain: New hypodensity involving the right parietal lobe with extension to the cortex is concerning for subacute infarct. Diffuse parenchymal volume loss with ex vacuo dilatation. Ill-defined focal hypodensity involving the left corona radiata. No mass lesion. No midline shift, ventriculomegaly or extra-axial fluid collection. Vascular: No hyperdense vessel or unexpected calcification. Bilateral skull base atherosclerotic calcifications. Skull: Negative for fracture or focal lesion. Sinuses/Orbits: Normal orbits. Clear paranasal sinuses. No mastoid effusion. Other: None. IMPRESSION: 1. Right parietal hypodensity is concerning for subacute infarct. 2. Ill-defined focal hypodensity involving the left corona radiata may represent an age-indeterminate infarct versus chronic microvascular ischemic changes. 3. Moderate cerebral atrophy. Electronically Signed: By: Primitivo Gauze M.D. On: 09/19/2020 14:23   MR ANGIO HEAD WO CONTRAST  Result Date: 09/20/2020 CLINICAL DATA:  Dementia, fall and encephalopathy EXAM: MRI HEAD WITHOUT CONTRAST MRA HEAD WITHOUT  CONTRAST TECHNIQUE: Multiplanar, multiecho pulse sequences of the brain and surrounding structures were obtained without intravenous contrast. Angiographic images of the head were obtained using MRA technique without contrast. COMPARISON:  None. 01/26/2019 FINDINGS: MRI HEAD FINDINGS Brain: No acute infarct, acute hemorrhage or extra-axial collection. Multifocal hyperintense T2-weighted signal within the white matter. Old right parietal infarct. Generalized volume loss without a clear lobar predilection. Hemosiderin deposition at the old right parietal lobe insult. Normal midline structures. Vascular: Normal flow voids. Skull and upper cervical spine: Normal marrow signal. Sinuses/Orbits: Negative. Other: None. MRA HEAD FINDINGS POSTERIOR CIRCULATION: --Vertebral arteries: Normal V4 segments. --Inferior cerebellar arteries: Normal. --Basilar artery: Normal. --Superior cerebellar arteries: Normal. --Posterior cerebral arteries: Normal. The left PCA is predominantly supplied by the posterior communicating artery. ANTERIOR CIRCULATION: --Intracranial internal carotid arteries: Normal. --Anterior cerebral arteries (ACA): Normal. Both A1 segments are present. Patent anterior communicating artery (a-comm). --Middle cerebral arteries (MCA): Normal. IMPRESSION: 1. No acute intracranial abnormality. 2. 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 3. Normal intracranial MRA. Electronically Signed   By: Ulyses Jarred M.D.   On: 09/20/2020 00:53   MR Brain Wo Contrast (neuro protocol)  Result Date: 09/20/2020 CLINICAL DATA:  Dementia, fall and encephalopathy EXAM: MRI HEAD WITHOUT CONTRAST MRA HEAD WITHOUT CONTRAST TECHNIQUE: Multiplanar, multiecho pulse sequences of the brain and surrounding structures were obtained without intravenous contrast. Angiographic images of the head were obtained using MRA technique without contrast. COMPARISON:  None. 01/26/2019 FINDINGS: MRI HEAD FINDINGS Brain: No  acute infarct, acute hemorrhage or extra-axial collection. Multifocal hyperintense T2-weighted signal within the white matter. Old right parietal infarct. Generalized volume loss without a clear lobar predilection. Hemosiderin deposition at the old right parietal lobe insult. Normal midline structures. Vascular: Normal flow voids. Skull and upper cervical spine: Normal marrow signal. Sinuses/Orbits: Negative. Other: None. MRA HEAD FINDINGS POSTERIOR CIRCULATION: --Vertebral arteries: Normal V4 segments. --Inferior cerebellar arteries: Normal. --Basilar artery: Normal. --Superior cerebellar arteries: Normal. --Posterior cerebral arteries: Normal. The left PCA is predominantly supplied by the posterior communicating artery. ANTERIOR CIRCULATION: --Intracranial internal carotid arteries: Normal. --Anterior cerebral arteries (ACA): Normal. Both A1 segments are present. Patent anterior communicating artery (a-comm). --Middle cerebral arteries (MCA): Normal. IMPRESSION: 1. No acute intracranial abnormality. 2. 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 3. Normal intracranial MRA. Electronically Signed   By: Ulyses Jarred M.D.   On: 09/20/2020 00:53   DG Chest Port 1 View  Result Date: 09/19/2020 CLINICAL DATA:  Fall.  Short of breath. EXAM: PORTABLE CHEST 1 VIEW COMPARISON:  None. FINDINGS: Elevated right hemidiaphragm with right lower  lobe patchy airspace disease likely atelectasis. Left lung clear. Heart size mildly enlarged. Negative for heart failure or edema. No significant pleural fluid. IMPRESSION: Elevated right hemidiaphragm with right lower lobe atelectasis. Electronically Signed   By: Franchot Gallo M.D.   On: 09/19/2020 11:59   ECHOCARDIOGRAM COMPLETE  Result Date: 09/20/2020    ECHOCARDIOGRAM REPORT   Patient Name:   Jennifer Arroyo Date of Exam: 09/20/2020 Medical Rec #:  [id number]       Height:       66.0 in Accession #:    [id number]      Weight:       195.0 lb Date  of Birth:  09-Jul-1941       BSA:          1.978 m Patient Age:    79 years        BP:           127/68 mmHg Patient Gender: F               HR:           76 bpm. Exam Location:  Inpatient Procedure: 2D Echo, Cardiac Doppler, Color Doppler and Intracardiac            Opacification Agent Indications:    Atrial fibrillation                 Stroke  History:        Patient has no prior history of Echocardiogram examinations.                 Arrythmias:Atrial Fibrillation and LBBB; Risk                 Factors:Dyslipidemia, Hypertension and Former Smoker.  Sonographer:    Clayton Lefort RDCS (AE) Referring Phys: [id number] Eugenie Filler  Sonographer Comments: Suboptimal apical window. IMPRESSIONS  1. Left ventricular ejection fraction, by estimation, is 60 to 65%. The left ventricle has normal function. The left ventricle has no regional wall motion abnormalities. There is mild left ventricular hypertrophy. Left ventricular diastolic parameters were normal.  2. Right ventricular systolic function is normal. The right ventricular size is normal. Mildly increased right ventricular wall thickness. There is normal pulmonary artery systolic pressure.  3. The mitral valve is normal in structure. No evidence of mitral valve regurgitation. No evidence of mitral stenosis.  4. The aortic valve is grossly normal. There is mild calcification of the aortic valve. Aortic valve regurgitation is not visualized. No aortic stenosis is present.  5. The inferior vena cava is normal in size with greater than 50% respiratory variability, suggesting right atrial pressure of 3 mmHg.  6. Increased flow velocities may be secondary to anemia, thyrotoxicosis, hyperdynamic or high flow state. FINDINGS  Left Ventricle: Left ventricular ejection fraction, by estimation, is 60 to 65%. The left ventricle has normal function. The left ventricle has no regional wall motion abnormalities. The left ventricular internal cavity size was normal in size. There is   mild left ventricular hypertrophy. Abnormal (paradoxical) septal motion, consistent with left bundle branch block. Left ventricular diastolic parameters were normal. Right Ventricle: The right ventricular size is normal. Mildly increased right ventricular wall thickness. Right ventricular systolic function is normal. There is normal pulmonary artery systolic pressure. The tricuspid regurgitant velocity is 2.07 m/s, and with an assumed right atrial pressure of 3 mmHg, the estimated right ventricular systolic pressure is 25.4 mmHg. Left Atrium: Left atrial size was normal in size.  Right Atrium: Right atrial size was normal in size. Pericardium: There is no evidence of pericardial effusion. Presence of pericardial fat pad. Mitral Valve: The mitral valve is normal in structure. No evidence of mitral valve regurgitation. No evidence of mitral valve stenosis. Tricuspid Valve: The tricuspid valve is normal in structure. Tricuspid valve regurgitation is trivial. No evidence of tricuspid stenosis. Aortic Valve: The aortic valve is grossly normal. There is mild calcification of the aortic valve. Aortic valve regurgitation is not visualized. No aortic stenosis is present. Aortic valve mean gradient measures 12.0 mmHg. Aortic valve peak gradient measures 21.5 mmHg. Aortic valve area, by VTI measures 2.80 cm. Pulmonic Valve: The pulmonic valve was normal in structure. Pulmonic valve regurgitation is trivial. No evidence of pulmonic stenosis. Aorta: The aortic root is normal in size and structure. Venous: The inferior vena cava is normal in size with greater than 50% respiratory variability, suggesting right atrial pressure of 3 mmHg. IAS/Shunts: No atrial level shunt detected by color flow Doppler.  LEFT VENTRICLE PLAX 2D LVIDd:         3.80 cm LVIDs:         2.60 cm LV PW:         1.20 cm LV IVS:        1.30 cm LVOT diam:     1.90 cm LV SV:         130 LV SV Index:   66 LVOT Area:     2.84 cm  RIGHT VENTRICLE             IVC  RV Basal diam:  2.90 cm     IVC diam: 0.70 cm RV S prime:     17.40 cm/s TAPSE (M-mode): 2.3 cm LEFT ATRIUM             Index       RIGHT ATRIUM           Index LA diam:        2.60 cm 1.31 cm/m  RA Area:     13.90 cm LA Vol (A2C):   41.2 ml 20.82 ml/m RA Volume:   31.00 ml  15.67 ml/m LA Vol (A4C):   54.3 ml 27.45 ml/m LA Biplane Vol: 48.3 ml 24.41 ml/m  AORTIC VALVE AV Area (Vmax):    2.76 cm AV Area (Vmean):   2.71 cm AV Area (VTI):     2.80 cm AV Vmax:           232.00 cm/s AV Vmean:          162.000 cm/s AV VTI:            0.465 m AV Peak Grad:      21.5 mmHg AV Mean Grad:      12.0 mmHg LVOT Vmax:         226.00 cm/s LVOT Vmean:        155.000 cm/s LVOT VTI:          0.459 m LVOT/AV VTI ratio: 0.99  AORTA Ao Root diam: 2.80 cm Ao Asc diam:  3.10 cm TRICUSPID VALVE TR Peak grad:   17.1 mmHg TR Vmax:        207.00 cm/s  SHUNTS Systemic VTI:  0.46 m Systemic Diam: 1.90 cm Cherlynn Kaiser MD Electronically signed by Cherlynn Kaiser MD Signature Date/Time: 09/20/2020/4:34:23 PM    Final    VAS US CAROTID (at [facility] and [facility] only)  Result Date: 09/20/2020 Carotid Arterial Duplex Study Indications:  CVA. Risk Factors:      Hypertension, hyperlipidemia. Limitations        Today's exam was limited due to the patient's respiratory                    variation, the body habitus of the patient and patient                    positioning. Comparison Study:  No prior studies. Performing Technologist: Oliver Hum RVT  Examination Guidelines: A complete evaluation includes B-mode imaging, spectral Doppler, color Doppler, and power Doppler as needed of all accessible portions of each vessel. Bilateral testing is considered an integral part of a complete examination. Limited examinations for reoccurring indications may be performed as noted.  Right Carotid Findings: +----------+--------+--------+--------+-----------------------+--------+           PSV cm/sEDV cm/sStenosisPlaque Description     Comments  +----------+--------+--------+--------+-----------------------+--------+ CCA Prox  66      9               smooth and heterogenous         +----------+--------+--------+--------+-----------------------+--------+ CCA Distal51      9               smooth and heterogenous         +----------+--------+--------+--------+-----------------------+--------+ ICA Prox  56      7               smooth and heterogenous         +----------+--------+--------+--------+-----------------------+--------+ ICA Distal36      12                                     tortuous +----------+--------+--------+--------+-----------------------+--------+ ECA       77      6                                               +----------+--------+--------+--------+-----------------------+--------+ +----------+--------+-------+--------+-------------------+           PSV cm/sEDV cmsDescribeArm Pressure (mmHG) +----------+--------+-------+--------+-------------------+ [id number]                                        +----------+--------+-------+--------+-------------------+ +---------+--------+--+--------+-+---------+ VertebralPSV cm/s34EDV cm/s7Antegrade +---------+--------+--+--------+-+---------+  Left Carotid Findings: +----------+--------+--------+--------+-----------------------+--------+           PSV cm/sEDV cm/sStenosisPlaque Description     Comments +----------+--------+--------+--------+-----------------------+--------+ CCA Prox  91      15              smooth and heterogenous         +----------+--------+--------+--------+-----------------------+--------+ CCA Distal64      16              smooth and heterogenous         +----------+--------+--------+--------+-----------------------+--------+ ICA Prox  48      12              smooth and heterogenous         +----------+--------+--------+--------+-----------------------+--------+ ICA Distal55      16  tortuous +----------+--------+--------+--------+-----------------------+--------+ ECA       85      12                                              +----------+--------+--------+--------+-----------------------+--------+ +----------+--------+--------+--------+-------------------+           PSV cm/sEDV cm/sDescribeArm Pressure (mmHG) +----------+--------+--------+--------+-------------------+ Subclavian162                                         +----------+--------+--------+--------+-------------------+ +---------+--------+--+--------+--+---------+ VertebralPSV cm/s68EDV cm/s11Antegrade +---------+--------+--+--------+--+---------+   Summary: Right Carotid: Velocities in the right ICA are consistent with a 1-39% stenosis. Left Carotid: Velocities in the left ICA are consistent with a 1-39% stenosis. Vertebrals: Bilateral vertebral arteries demonstrate antegrade flow. *See table(s) above for measurements and observations.  Electronically signed by Monica Martinez MD on 09/20/2020 at 3:32:48 PM.    Final      Discharge Exam: Vitals:   09/22/20 0749 09/22/20 0900  BP: (!) 142/65   Pulse: 88   Resp: 19   Temp: 98.6 F (37 C)   SpO2: 96% 95%   Vitals:   09/22/20 0022 09/22/20 0333 09/22/20 0749 09/22/20 0900  BP: 115/60 (!) 101/56 (!) 142/65   Pulse: 60 (!) 59 88   Resp: 18 18 19    Temp: 98.5 F (36.9 C) 97.9 F (36.6 C) 98.6 F (37 C)   TempSrc: Axillary Axillary Oral   SpO2: 96% 94% 96% 95%    General: Pt is alert, awake, not in acute distress Cardiovascular: RRR, S1/S2 +, no rubs, no gallops Respiratory: CTA bilaterally, no wheezing, no rhonchi Abdominal: Soft, NT, ND, bowel sounds + Extremities: no edema, no cyanosis    The results of significant diagnostics from this hospitalization (including imaging, microbiology, ancillary and laboratory) are listed below for reference.     Microbiology: Recent Results (from the past  240 hour(s))  Urine culture     Status: Abnormal   Collection Time: 09/19/20 12:00 PM   Specimen: Urine, Clean Catch  Result Value Ref Range Status   Specimen Description   Final    URINE, CLEAN CATCH Performed at [facility], [place] [address], [place], [place] [address]    Special Requests   Final    NONE Performed at [facility], [place] [address], [place], [place] [address]    Culture >=100,000 COLONIES/mL ENTEROCOCCUS FAECALIS (A)  Final   Report Status 09/21/2020 FINAL  Final   Organism ID, Bacteria ENTEROCOCCUS FAECALIS (A)  Final      Susceptibility   Enterococcus faecalis - MIC*    AMPICILLIN <=2 SENSITIVE Sensitive     NITROFURANTOIN <=16 SENSITIVE Sensitive     VANCOMYCIN 2 SENSITIVE Sensitive     * >=100,000 COLONIES/mL ENTEROCOCCUS FAECALIS  Blood culture (routine x 2)     Status: None (Preliminary result)   Collection Time: 09/19/20  1:08 PM   Specimen: BLOOD RIGHT FOREARM  Result Value Ref Range Status   Specimen Description   Final    BLOOD RIGHT FOREARM Performed at  [address], [place], [place] [address]    Special Requests   Final    BOTTLES DRAWN AEROBIC AND ANAEROBIC Blood Culture adequate volume Performed at [place] [address]  [place], [place] [address]    Culture   Final    NO GROWTH 3 DAYS Performed at [facility], [place] [address], [place], [place] [address]    Report Status PENDING  Incomplete  Blood culture (routine x 2)     Status: None (Preliminary result)   Collection Time: 09/19/20  1:08 PM   Specimen: BLOOD LEFT HAND  Result Value Ref Range Status   Specimen Description   Final    BLOOD LEFT HAND Performed at [place] [address] [place], [place] [address]    Special Requests   Final    BOTTLES DRAWN AEROBIC AND ANAEROBIC Blood Culture adequate volume Performed at [place]  [address], [place], [place] [address]    Culture   Final    NO GROWTH 3 DAYS Performed at [facility], [place] [address], [place], [place] [address]    Report Status PENDING  Incomplete  Respiratory Panel by RT PCR (Flu A&B, Covid) - Nasopharyngeal Swab     Status: None   Collection Time: 09/19/20  1:24 PM   Specimen: Nasopharyngeal Swab  Result Value Ref Range Status   SARS Coronavirus 2 by RT PCR NEGATIVE NEGATIVE Final    Comment: (NOTE) SARS-CoV-2 target nucleic acids are NOT DETECTED.  The SARS-CoV-2 RNA is generally detectable in upper respiratoy specimens during the acute phase of infection. The lowest concentration of SARS-CoV-2 viral copies this assay can detect is 131 copies/mL. A negative result does not preclude SARS-Cov-2 infection and should not be used as the sole basis for treatment or other patient management decisions. A negative result may occur with  improper specimen collection/handling, submission of specimen other than nasopharyngeal swab, presence of viral mutation(s) within the areas targeted by this assay, and inadequate number of viral copies (<131 copies/mL). A negative result must be combined with clinical observations, patient history, and epidemiological information. The expected result is Negative.  Fact Sheet for Patients:  PinkCheek.be  Fact Sheet for Healthcare Providers:  GravelBags.it  This test is no t yet approved or cleared by the [place] FDA and  has been authorized for detection and/or diagnosis of SARS-CoV-2 by FDA under an Emergency Use Authorization (EUA). This EUA will remain  in effect (meaning this test can be used) for the duration of the COVID-19 declaration under Section 564(b)(1) of the Act, 21 U.S.C. section 360bbb-3(b)(1), unless the authorization is terminated or revoked sooner.     Influenza A by PCR NEGATIVE NEGATIVE Final   Influenza B by PCR NEGATIVE  NEGATIVE Final    Comment: (NOTE) The Xpert Xpress SARS-CoV-2/FLU/RSV assay is intended as an aid in  the diagnosis of influenza from Nasopharyngeal swab specimens and  should not be used as a sole basis for treatment. Nasal washings and  aspirates are unacceptable for Xpert Xpress SARS-CoV-2/FLU/RSV  testing.  Fact Sheet for Patients: PinkCheek.be  Fact Sheet for Healthcare Providers: GravelBags.it  This test is not yet approved or cleared by the [place] FDA and  has been authorized for detection and/or diagnosis of SARS-CoV-2 by  FDA under an Emergency Use Authorization (EUA). This EUA will remain  in effect (meaning this test can be used) for the duration of the  Covid-19 declaration under Section 564(b)(1) of the Act, 21  U.S.C. section 360bbb-3(b)(1), unless the authorization is  terminated or revoked. Performed at [facility], [place] [address] [place], [place] [address]  Labs: BNP (last 3 results) No results for input(s): BNP in the last 8760 hours. Basic Metabolic Panel: Recent Labs  Lab 09/19/20 1200 09/20/20 0418 09/21/20 0151  NA 140 140 139  K 3.2* 4.2 3.9  CL 104 107 107  CO2 23 23 23   GLUCOSE 143* 105* 111*  BUN 28* 22 19  CREATININE 0.84 0.92 0.84  CALCIUM 10.2 9.4 9.2  MG 2.4 2.0  --    Liver Function Tests: Recent Labs  Lab 09/19/20 1200  AST 27  ALT 23  ALKPHOS 67  BILITOT 1.3*  PROT 7.7  ALBUMIN 3.8   No results for input(s): LIPASE, AMYLASE in the last 168 hours. No results for input(s): AMMONIA in the last 168 hours. CBC: Recent Labs  Lab 09/19/20 1200 09/20/20 0418 09/21/20 0151  WBC 16.5* 12.2* 11.4*  NEUTROABS 12.8* 9.0* 8.0*  HGB 16.6* 14.5 13.9  HCT 48.3* 43.3 42.3  MCV 95.5 97.1 97.0  PLT 220 191 213   Cardiac Enzymes: No results for input(s): CKTOTAL, CKMB, CKMBINDEX, TROPONINI in the last 168 hours. BNP: Invalid input(s):  POCBNP CBG: No results for input(s): GLUCAP in the last 168 hours. D-Dimer No results for input(s): DDIMER in the last 72 hours. Hgb A1c No results for input(s): HGBA1C in the last 72 hours. Lipid Profile Recent Labs    09/20/20 0418  CHOL 135  HDL 46  LDLCALC 71  TRIG 88  CHOLHDL 2.9   Thyroid function studies Recent Labs    09/21/20 1535  TSH 0.923   Anemia work up No results for input(s): VITAMINB12, FOLATE, FERRITIN, TIBC, IRON, RETICCTPCT in the last 72 hours. Urinalysis    Component Value Date/Time   COLORURINE YELLOW 09/19/2020 1200   APPEARANCEUR CLEAR 09/19/2020 1200   LABSPEC 1.019 09/19/2020 1200   PHURINE 5.0 09/19/2020 1200   GLUCOSEU NEGATIVE 09/19/2020 1200   HGBUR NEGATIVE 09/19/2020 1200   BILIRUBINUR NEGATIVE 09/19/2020 1200   KETONESUR 5 (A) 09/19/2020 1200   PROTEINUR NEGATIVE 09/19/2020 1200   NITRITE NEGATIVE 09/19/2020 1200   LEUKOCYTESUR TRACE (A) 09/19/2020 1200   Sepsis Labs Invalid input(s): PROCALCITONIN,  WBC,  LACTICIDVEN Microbiology Recent Results (from the past 240 hour(s))  Urine culture     Status: Abnormal   Collection Time: 09/19/20 12:00 PM   Specimen: Urine, Clean Catch  Result Value Ref Range Status   Specimen Description   Final    URINE, CLEAN CATCH Performed at [facility], [place] [address], [place], [place] [address]    Special Requests   Final    NONE Performed at [facility], [place] [address], [place], [place] [address]    Culture >=100,000 COLONIES/mL ENTEROCOCCUS FAECALIS (A)  Final   Report Status 09/21/2020 FINAL  Final   Organism ID, Bacteria ENTEROCOCCUS FAECALIS (A)  Final      Susceptibility   Enterococcus faecalis - MIC*    AMPICILLIN <=2 SENSITIVE Sensitive     NITROFURANTOIN <=16 SENSITIVE Sensitive     VANCOMYCIN 2 SENSITIVE Sensitive     * >=100,000 COLONIES/mL ENTEROCOCCUS FAECALIS  Blood culture (routine x 2)     Status: None (Preliminary result)    Collection Time: 09/19/20  1:08 PM   Specimen: BLOOD RIGHT FOREARM  Result Value Ref Range Status   Specimen Description   Final    BLOOD RIGHT FOREARM Performed at [place] [address], [place], [place] [address]    Special Requests   Final  BOTTLES DRAWN AEROBIC AND ANAEROBIC Blood Culture adequate volume Performed at [place] [address], [place], [place] [address]    Culture   Final    NO GROWTH 3 DAYS Performed at [facility], [place] [address], [place], [place] [address]    Report Status PENDING  Incomplete  Blood culture (routine x 2)     Status: None (Preliminary result)   Collection Time: 09/19/20  1:08 PM   Specimen: BLOOD LEFT HAND  Result Value Ref Range Status   Specimen Description   Final    BLOOD LEFT HAND Performed at [place] [address], [place], [place] [address]    Special Requests   Final    BOTTLES DRAWN AEROBIC AND ANAEROBIC Blood Culture adequate volume Performed at [facility] [address], [place], [place] [address]    Culture   Final    NO GROWTH 3 DAYS Performed at [facility], [place] [address], [place], [place] [address]    Report Status PENDING  Incomplete  Respiratory Panel by RT PCR (Flu A&B, Covid) - Nasopharyngeal Swab     Status: None   Collection Time: 09/19/20  1:24 PM   Specimen: Nasopharyngeal Swab  Result Value Ref Range Status   SARS Coronavirus 2 by RT PCR NEGATIVE NEGATIVE Final    Comment: (NOTE) SARS-CoV-2 target nucleic acids are NOT DETECTED.  The SARS-CoV-2 RNA is generally detectable in upper respiratoy specimens during the acute phase of infection. The lowest concentration of SARS-CoV-2 viral copies this assay can detect is 131 copies/mL. A negative result does not preclude SARS-Cov-2 infection and should not be used as the sole basis for treatment or other patient management decisions. A negative result may  occur with  improper specimen collection/handling, submission of specimen other than nasopharyngeal swab, presence of viral mutation(s) within the areas targeted by this assay, and inadequate number of viral copies (<131 copies/mL). A negative result must be combined with clinical observations, patient history, and epidemiological information. The expected result is Negative.  Fact Sheet for Patients:  PinkCheek.be  Fact Sheet for Healthcare Providers:  GravelBags.it  This test is no t yet approved or cleared by the [place] FDA and  has been authorized for detection and/or diagnosis of SARS-CoV-2 by FDA under an Emergency Use Authorization (EUA). This EUA will remain  in effect (meaning this test can be used) for the duration of the COVID-19 declaration under Section 564(b)(1) of the Act, 21 U.S.C. section 360bbb-3(b)(1), unless the authorization is terminated or revoked sooner.     Influenza A by PCR NEGATIVE NEGATIVE Final   Influenza B by PCR NEGATIVE NEGATIVE Final    Comment: (NOTE) The Xpert Xpress SARS-CoV-2/FLU/RSV assay is intended as an aid in  the diagnosis of influenza from Nasopharyngeal swab specimens and  should not be used as a sole basis for treatment. Nasal washings and  aspirates are unacceptable for Xpert Xpress SARS-CoV-2/FLU/RSV  testing.  Fact Sheet for Patients: PinkCheek.be  Fact Sheet for Healthcare Providers: GravelBags.it  This test is not yet approved or cleared by the [place] FDA and  has been authorized for detection and/or diagnosis of SARS-CoV-2 by  FDA under an Emergency Use Authorization (EUA). This EUA will remain  in effect (meaning this test can be used) for the duration of the  Covid-19 declaration under Section 564(b)(1) of the Act, 21  U.S.C. section 360bbb-3(b)(1), unless the authorization is  terminated or  revoked. Performed at [facility], [place] [address] [place], [place] [address]      Time coordinating discharge: Over 30 minutes  SIGNED:   Darliss Cheney, MD  [facility] 09/22/2020, 10:52 AM  If 7PM-7AM, please contact night-coverage [email or web address]

## 2020-09-22 NOTE — Progress Notes (Signed)
Progress Note  Patient Name: Jennifer Arroyo Date of Encounter: 09/22/2020  [place] Cardiologist: No primary care provider on file.   Subjective  Remained in NSR overnight Wants to go home today. Feels well.   Inpatient Medications    Scheduled Meds: . apixaban  5 mg Oral BID  . influenza vaccine adjuvanted  0.5 mL Intramuscular Tomorrow-1000  . memantine  10 mg Oral BID  . metoprolol tartrate  50 mg Oral BID  . rosuvastatin  10 mg Oral Daily  . valACYclovir  500 mg Oral Daily  . vitamin B-12  1,000 mcg Oral Daily   Continuous Infusions:  PRN Meds: acetaminophen **OR** acetaminophen (TYLENOL) oral liquid 160 mg/5 mL **OR** acetaminophen, ondansetron (ZOFRAN) IV, senna-docusate   Vital Signs    Vitals:   09/22/20 0022 09/22/20 0333 09/22/20 0749 09/22/20 0900  BP: 115/60 (!) 101/56 (!) 142/65   Pulse: 60 (!) 59 88   Resp: 18 18 19    Temp: 98.5 F (36.9 C) 97.9 F (36.6 C) 98.6 F (37 C)   TempSrc: Axillary Axillary Oral   SpO2: 96% 94% 96% 95%    Intake/Output Summary (Last 24 hours) at 09/22/2020 1000 Last data filed at 09/21/2020 1307 Gross per 24 hour  Intake 240 ml  Output 250 ml  Net -10 ml   Last 3 Weights 03/27/2020 01/11/2019  Weight (lbs) 195 lb 171 lb 8 oz  Weight (kg) 88.451 kg 77.792 kg      Telemetry    NSR; no afib overnight - Personally Reviewed  ECG    No new ECG - Personally Reviewed  Physical Exam   GEN: Sitting comfortably in bed, NAD Neck: No JVD Cardiac: RRR, no murmurs, rubs, or gallops.  Respiratory: CTAB GI: Soft, nontender, non-distended  MS: No edema; No deformity. Neuro:  Nonfocal  Psych: Normal affect   Labs    High Sensitivity Troponin:   Recent Labs  Lab 09/19/20 1200 09/19/20 1540  TROPONINIHS 61* 50*      Chemistry Recent Labs  Lab 09/19/20 1200 09/20/20 0418 09/21/20 0151  NA 140 140 139  K 3.2* 4.2 3.9  CL 104 107 107  CO2 23 23 23   GLUCOSE 143* 105* 111*  BUN 28* 22 19  CREATININE  0.84 0.92 0.84  CALCIUM 10.2 9.4 9.2  PROT 7.7  --   --   ALBUMIN 3.8  --   --   AST 27  --   --   ALT 23  --   --   ALKPHOS 67  --   --   BILITOT 1.3*  --   --   GFRNONAA >60 59* >60  ANIONGAP 13 10 9      Hematology Recent Labs  Lab 09/19/20 1200 09/20/20 0418 09/21/20 0151  WBC 16.5* 12.2* 11.4*  RBC 5.06 4.46 4.36  HGB 16.6* 14.5 13.9  HCT 48.3* 43.3 42.3  MCV 95.5 97.1 97.0  MCH 32.8 32.5 31.9  MCHC 34.4 33.5 32.9  RDW 13.0 13.0 12.8  PLT 220 191 213    BNPNo results for input(s): BNP, PROBNP in the last 168 hours.   DDimer No results for input(s): DDIMER in the last 168 hours.   Radiology    ECHOCARDIOGRAM COMPLETE  Result Date: 09/20/2020    ECHOCARDIOGRAM REPORT   Patient Name:   Jennifer Arroyo Date of Exam: 09/20/2020 Medical Rec #:  [id number]       Height:       66.0 in Accession #:  [id number]      Weight:       195.0 lb Date of Birth:  03-Apr-1941       BSA:          1.978 m Patient Age:    79 years        BP:           127/68 mmHg Patient Gender: F               HR:           76 bpm. Exam Location:  Inpatient Procedure: 2D Echo, Cardiac Doppler, Color Doppler and Intracardiac            Opacification Agent Indications:    Atrial fibrillation                 Stroke  History:        Patient has no prior history of Echocardiogram examinations.                 Arrythmias:Atrial Fibrillation and LBBB; Risk                 Factors:Dyslipidemia, Hypertension and Former Smoker.  Sonographer:    Clayton Lefort RDCS (AE) Referring Phys: [id number] Eugenie Filler  Sonographer Comments: Suboptimal apical window. IMPRESSIONS  1. Left ventricular ejection fraction, by estimation, is 60 to 65%. The left ventricle has normal function. The left ventricle has no regional wall motion abnormalities. There is mild left ventricular hypertrophy. Left ventricular diastolic parameters were normal.  2. Right ventricular systolic function is normal. The right ventricular size is normal. Mildly  increased right ventricular wall thickness. There is normal pulmonary artery systolic pressure.  3. The mitral valve is normal in structure. No evidence of mitral valve regurgitation. No evidence of mitral stenosis.  4. The aortic valve is grossly normal. There is mild calcification of the aortic valve. Aortic valve regurgitation is not visualized. No aortic stenosis is present.  5. The inferior vena cava is normal in size with greater than 50% respiratory variability, suggesting right atrial pressure of 3 mmHg.  6. Increased flow velocities may be secondary to anemia, thyrotoxicosis, hyperdynamic or high flow state. FINDINGS  Left Ventricle: Left ventricular ejection fraction, by estimation, is 60 to 65%. The left ventricle has normal function. The left ventricle has no regional wall motion abnormalities. The left ventricular internal cavity size was normal in size. There is  mild left ventricular hypertrophy. Abnormal (paradoxical) septal motion, consistent with left bundle branch block. Left ventricular diastolic parameters were normal. Right Ventricle: The right ventricular size is normal. Mildly increased right ventricular wall thickness. Right ventricular systolic function is normal. There is normal pulmonary artery systolic pressure. The tricuspid regurgitant velocity is 2.07 m/s, and with an assumed right atrial pressure of 3 mmHg, the estimated right ventricular systolic pressure is 47.8 mmHg. Left Atrium: Left atrial size was normal in size. Right Atrium: Right atrial size was normal in size. Pericardium: There is no evidence of pericardial effusion. Presence of pericardial fat pad. Mitral Valve: The mitral valve is normal in structure. No evidence of mitral valve regurgitation. No evidence of mitral valve stenosis. Tricuspid Valve: The tricuspid valve is normal in structure. Tricuspid valve regurgitation is trivial. No evidence of tricuspid stenosis. Aortic Valve: The aortic valve is grossly normal. There  is mild calcification of the aortic valve. Aortic valve regurgitation is not visualized. No aortic stenosis is present. Aortic valve mean gradient measures 12.0 mmHg. Aortic  valve peak gradient measures 21.5 mmHg. Aortic valve area, by VTI measures 2.80 cm. Pulmonic Valve: The pulmonic valve was normal in structure. Pulmonic valve regurgitation is trivial. No evidence of pulmonic stenosis. Aorta: The aortic root is normal in size and structure. Venous: The inferior vena cava is normal in size with greater than 50% respiratory variability, suggesting right atrial pressure of 3 mmHg. IAS/Shunts: No atrial level shunt detected by color flow Doppler.  LEFT VENTRICLE PLAX 2D LVIDd:         3.80 cm LVIDs:         2.60 cm LV PW:         1.20 cm LV IVS:        1.30 cm LVOT diam:     1.90 cm LV SV:         130 LV SV Index:   66 LVOT Area:     2.84 cm  RIGHT VENTRICLE             IVC RV Basal diam:  2.90 cm     IVC diam: 0.70 cm RV S prime:     17.40 cm/s TAPSE (M-mode): 2.3 cm LEFT ATRIUM             Index       RIGHT ATRIUM           Index LA diam:        2.60 cm 1.31 cm/m  RA Area:     13.90 cm LA Vol (A2C):   41.2 ml 20.82 ml/m RA Volume:   31.00 ml  15.67 ml/m LA Vol (A4C):   54.3 ml 27.45 ml/m LA Biplane Vol: 48.3 ml 24.41 ml/m  AORTIC VALVE AV Area (Vmax):    2.76 cm AV Area (Vmean):   2.71 cm AV Area (VTI):     2.80 cm AV Vmax:           232.00 cm/s AV Vmean:          162.000 cm/s AV VTI:            0.465 m AV Peak Grad:      21.5 mmHg AV Mean Grad:      12.0 mmHg LVOT Vmax:         226.00 cm/s LVOT Vmean:        155.000 cm/s LVOT VTI:          0.459 m LVOT/AV VTI ratio: 0.99  AORTA Ao Root diam: 2.80 cm Ao Asc diam:  3.10 cm TRICUSPID VALVE TR Peak grad:   17.1 mmHg TR Vmax:        207.00 cm/s  SHUNTS Systemic VTI:  0.46 m Systemic Diam: 1.90 cm Cherlynn Kaiser MD Electronically signed by Cherlynn Kaiser MD Signature Date/Time: 09/20/2020/4:34:23 PM    Final    VAS US CAROTID (at [facility] and [facility]  only)  Result Date: 09/20/2020 Carotid Arterial Duplex Study Indications:       CVA. Risk Factors:      Hypertension, hyperlipidemia. Limitations        Today's exam was limited due to the patient's respiratory                    variation, the body habitus of the patient and patient                    positioning. Comparison Study:  No prior studies. Performing Technologist: Oliver Hum RVT  Examination Guidelines: A complete evaluation  includes B-mode imaging, spectral Doppler, color Doppler, and power Doppler as needed of all accessible portions of each vessel. Bilateral testing is considered an integral part of a complete examination. Limited examinations for reoccurring indications may be performed as noted.  Right Carotid Findings: +----------+--------+--------+--------+-----------------------+--------+           PSV cm/sEDV cm/sStenosisPlaque Description     Comments +----------+--------+--------+--------+-----------------------+--------+ CCA Prox  66      9               smooth and heterogenous         +----------+--------+--------+--------+-----------------------+--------+ CCA Distal51      9               smooth and heterogenous         +----------+--------+--------+--------+-----------------------+--------+ ICA Prox  56      7               smooth and heterogenous         +----------+--------+--------+--------+-----------------------+--------+ ICA Distal36      12                                     tortuous +----------+--------+--------+--------+-----------------------+--------+ ECA       77      6                                               +----------+--------+--------+--------+-----------------------+--------+ +----------+--------+-------+--------+-------------------+           PSV cm/sEDV cmsDescribeArm Pressure (mmHG) +----------+--------+-------+--------+-------------------+ [id number]                                         +----------+--------+-------+--------+-------------------+ +---------+--------+--+--------+-+---------+ VertebralPSV cm/s34EDV cm/s7Antegrade +---------+--------+--+--------+-+---------+  Left Carotid Findings: +----------+--------+--------+--------+-----------------------+--------+           PSV cm/sEDV cm/sStenosisPlaque Description     Comments +----------+--------+--------+--------+-----------------------+--------+ CCA Prox  91      15              smooth and heterogenous         +----------+--------+--------+--------+-----------------------+--------+ CCA Distal64      16              smooth and heterogenous         +----------+--------+--------+--------+-----------------------+--------+ ICA Prox  48      12              smooth and heterogenous         +----------+--------+--------+--------+-----------------------+--------+ ICA Distal55      16                                     tortuous +----------+--------+--------+--------+-----------------------+--------+ ECA       85      12                                              +----------+--------+--------+--------+-----------------------+--------+ +----------+--------+--------+--------+-------------------+           PSV cm/sEDV cm/sDescribeArm Pressure (mmHG) +----------+--------+--------+--------+-------------------+ Subclavian162                                         +----------+--------+--------+--------+-------------------+ +---------+--------+--+--------+--+---------+  VertebralPSV cm/s68EDV cm/s11Antegrade +---------+--------+--+--------+--+---------+   Summary: Right Carotid: Velocities in the right ICA are consistent with a 1-39% stenosis. Left Carotid: Velocities in the left ICA are consistent with a 1-39% stenosis. Vertebrals: Bilateral vertebral arteries demonstrate antegrade flow. *See table(s) above for measurements and observations.  Electronically signed by Monica Martinez MD on  09/20/2020 at 3:32:48 PM.    Final     Cardiac Studies  TTE 09/20/20 IMPRESSIONS  1. Left ventricular ejection fraction, by estimation, is 60 to 65%. The  left ventricle has normal function. The left ventricle has no regional  wall motion abnormalities. There is mild left ventricular hypertrophy.  Left ventricular diastolic parameters  were normal.  2. Right ventricular systolic function is normal. The right ventricular  size is normal. Mildly increased right ventricular wall thickness. There  is normal pulmonary artery systolic pressure.  3. The mitral valve is normal in structure. No evidence of mitral valve  regurgitation. No evidence of mitral stenosis.  4. The aortic valve is grossly normal. There is mild calcification of the  aortic valve. Aortic valve regurgitation is not visualized. No aortic  stenosis is present.  5. The inferior vena cava is normal in size with greater than 50%  respiratory variability, suggesting right atrial pressure of 3 mmHg.  6. Increased flow velocities may be secondary to anemia, thyrotoxicosis,  hyperdynamic or high flow state.   Patient Profile     79 y.o. female with HTN, HLD, and dementia who presented to ED with several falls found to have new Afib with RVR with HR 160s for which cardiology has been consulted.  Assessment & Plan    #New Afib with RVR: CHADs-vasc 7. Patient with newly diagnosed Afib with RVR with rates 160s in ED. Now converted back to NSR with HR 80s currently. Off dilt gtt and transitioned to PO. On apixaban for anticoagulation. -Continue apixaban 5mg  BID -Off dilt gtt -Transition to metop 50mg  XL upon discharge -Follow-up with me in clinic in 1-2 weeks -Okay to discharge home today from CV standpoint  #HTN Well controlled. -Plan to discharge home on metop 50mg  XL -Resume home HCTZ on discharge  #HLD -Continue rosuvastatin 10mg   #Mild carotid artery disease: -Continue statin -No ASA given need for  [facility]  #History of prior CVA Neurology following. MRI with chronic cortical stroke.  -Carotids with minimal disease -Continue crestor 10mg  -No ASA given need for [facility]  Discharge home today with follow-up in clinic with me in 1-2weeks post-discharge.  For questions or updates, please contact [facility] Please consult [email or web address] for contact info under        Signed, Freada Bergeron, MD  09/22/2020, 10:00 AM

## 2020-09-22 NOTE — Progress Notes (Signed)
Pt discharged home with spouse Shelbe Haglund. Discharge information given to spouse and all questions answered. Personal belongings taken with patient.

## 2020-09-22 NOTE — Care Management Important Message (Signed)
Important Message  Patient Details  Name: Jennifer Arroyo MRN: [id number] Date of Birth: Apr 28, 1941   Medicare Important Message Given:  Yes     Saysha Menta [address] 09/22/2020, 3:55 PM

## 2020-09-24 LAB — CULTURE, BLOOD (ROUTINE X 2)
Culture: NO GROWTH
Culture: NO GROWTH
Special Requests: ADEQUATE
Special Requests: ADEQUATE

## 2020-10-21 ENCOUNTER — Other Ambulatory Visit: Payer: Self-pay

## 2020-10-21 MED ORDER — APIXABAN 5 MG PO TABS: 5.0000 mg | ORAL_TABLET | Freq: Two times a day (BID) | ORAL | 0 refills | Status: DC

## 2020-10-21 NOTE — Telephone Encounter (Signed)
Eliquis 5mg  refill request received. Patient is 79 years old, weight-88.5kg, Crea-0.84 on 09/21/2020, Diagnosis-Afib diagnosed while in the hospital on 09/19/20 Hospital Admission and was placed on Eliquis at that time, and will be seen by Gwyndolyn Kaufman on 10/28/2020 at 1040am. Dose is appropriate based on dosing criteria. Will send in a refill to requested pharmacy.

## 2020-10-21 NOTE — Telephone Encounter (Signed)
[name] is requesting a refill on Eliquis until pt comes in on 10/28/20. Please address

## 2020-10-26 NOTE — Progress Notes (Deleted)
Cardiology Office Note:    Date:  10/26/2020   ID:  Jennifer Arroyo, DOB 24-Oct-1941, MRN [id number]  PCP:  Kathyrn Lass, MD  [facility] Cardiologist:  No primary care provider on file.  [facility] Electrophysiologist:  None   Referring MD: Kathyrn Lass, MD    History of Present Illness:    Jennifer Arroyo is a 79 y.o. female with a hx of HTN, HLD, dementia, and newly diagnosed Afib during recent hospitalization who was referred by Dr. Sabra Heck for follow-up for her Afib.  The patient was recently hospitalized this October when she presented after several falls at home. On admission, she was noted to be in new Afib with RVR with HR 160s. Patient denied any chest pain, lightheadedness, palpitations, SOB, or LE edema. She was placed on dilt gtt with improvement and apixaban for [facility]. TTE with normal EF 60-65%, normal LA/RA, no significant valvular abnormalities. She converted back to SR during her hospitalization and now presents to clinic for follow-up.  Today,  Past Medical History:  Diagnosis Date  . Bilateral bunions    hammer toe  . Cervical os stenosis   . Gallstones   . Hemangioma of liver    probable  . Hemorrhoids   . Hyperlipemia   . Hypertension   . Insomnia   . Left bundle branch block   . Left bundle branch block   . Low back pain   . Memory loss   . Plantar fasciitis   . Rectocele   . Renal cyst     Past Surgical History:  Procedure Laterality Date  . APPENDECTOMY    . COLONOSCOPY    . TONSILLECTOMY      Current Medications: No outpatient medications have been marked as taking for the 10/28/20 encounter (Appointment) with Freada Bergeron, MD.     Allergies:   Amoxicillin, Ceclor [cefaclor], Levaquin [levofloxacin], and Nystatin   Social History   Socioeconomic History  . Marital status: Married    Spouse name: Not on file  . Number of children: 0  . Years of education: college  . Highest education level: Not on file  Occupational  History  . Occupation: Retired Education officer, museum  Tobacco Use  . Smoking status: Former Smoker    Types: Cigarettes  . Smokeless tobacco: Never Used  . Tobacco comment: quit at age 33, 1 PPD x 10 year history  Vaping Use  . Vaping Use: Never used  Substance and Sexual Activity  . Alcohol use: Never    Comment: rare  . Drug use: Never  . Sexual activity: Not on file  Other Topics Concern  . Not on file  Social History Narrative   Lives at home with husband.   Right-handed.   2 cups caffeine daily.   Social Determinants of Health   Financial Resource Strain:   . Difficulty of Paying Living Expenses: Not on file  Food Insecurity:   . Worried About Charity fundraiser in the Last Year: Not on file  . Ran Out of Food in the Last Year: Not on file  Transportation Needs:   . Lack of Transportation (Medical): Not on file  . Lack of Transportation (Non-Medical): Not on file  Physical Activity:   . Days of Exercise per Week: Not on file  . Minutes of Exercise per Session: Not on file  Stress:   . Feeling of Stress : Not on file  Social Connections:   . Frequency of Communication with Friends  and Family: Not on file  . Frequency of Social Gatherings with Friends and Family: Not on file  . Attends Religious Services: Not on file  . Active Member of Clubs or Organizations: Not on file  . Attends Archivist Meetings: Not on file  . Marital Status: Not on file     Family History: The patient's ***family history includes Heart disease in her mother; Hyperlipidemia in her brother; Hypertension in her brother; Liver cancer in her father; Rheumatic fever in her mother; Stroke in her mother.  ROS:   Please see the history of present illness.    *** All other systems reviewed and are negative.  EKGs/Labs/Other Studies Reviewed:    The following studies were reviewed today: TTE October 12, 2020 IMPRESSIONS  1. Left ventricular ejection fraction, by estimation, is 60 to 65%. The   left ventricle has normal function. The left ventricle has no regional  wall motion abnormalities. There is mild left ventricular hypertrophy.  Left ventricular diastolic parameters  were normal.  2. Right ventricular systolic function is normal. The right ventricular  size is normal. Mildly increased right ventricular wall thickness. There  is normal pulmonary artery systolic pressure.  3. The mitral valve is normal in structure. No evidence of mitral valve  regurgitation. No evidence of mitral stenosis.  4. The aortic valve is grossly normal. There is mild calcification of the  aortic valve. Aortic valve regurgitation is not visualized. No aortic  stenosis is present.  5. The inferior vena cava is normal in size with greater than 50%  respiratory variability, suggesting right atrial pressure of 3 mmHg.  6. Increased flow velocities may be secondary to anemia, thyrotoxicosis,  hyperdynamic or high flow state.   EKG:  EKG is *** ordered today.  The ekg ordered today demonstrates ***  Recent Labs: 09/19/2020: ALT 23 Oct 12, 2020: Magnesium 2.0 09/21/2020: BUN 19; Creatinine, Ser 0.84; Hemoglobin 13.9; Platelets 213; Potassium 3.9; Sodium 139; TSH 0.923  Recent Lipid Panel    Component Value Date/Time   CHOL 135 2020-10-12 0418   TRIG 88 10-12-20 0418   HDL 46 10-12-20 0418   CHOLHDL 2.9 12-Oct-2020 0418   VLDL 18 Oct 12, 2020 0418   LDLCALC 71 10-12-2020 0418     Risk Assessment/Calculations:   {Does this patient have ATRIAL FIBRILLATION?[phone]}   Physical Exam:    VS:  There were no vitals taken for this visit.    Wt Readings from Last 3 Encounters:  03/27/20 195 lb (88.5 kg)  01/11/19 171 lb 8 oz (77.8 kg)     GEN: *** Well nourished, well developed in no acute distress HEENT: Normal NECK: No JVD; No carotid bruits LYMPHATICS: No lymphadenopathy CARDIAC: ***RRR, no murmurs, rubs, gallops RESPIRATORY:  Clear to auscultation without rales, wheezing or  rhonchi  ABDOMEN: Soft, non-tender, non-distended MUSCULOSKELETAL:  No edema; No deformity  SKIN: Warm and dry NEUROLOGIC:  Alert and oriented x 3 PSYCHIATRIC:  Normal affect   ASSESSMENT:    No diagnosis found. PLAN:    In order of problems listed above:  #New Afib with RVR: CHADs-vasc 7. Patient with newly diagnosed Afib with RVR with rates 160s in during recent admission in October. On Metop for rate control and apixaban for anticoagulation. -Continue apixaban 5mg  BID -Continue metop 50mg  XL   #HTN Well controlled. -Continue metop 50mg  XL -Continue HCTZ   #HLD -Continue rosuvastatin 10mg   #Mild carotid artery disease: -Continue statin -No ASA given need for [facility]  #History of prior CVA Neurology following.  MRI with chronic cortical stroke.  -Carotids with minimal disease -Continue crestor 10mg  -No ASA given need for [facility]    Shared Decision Making/Informed Consent   {Are you ordering a CV Procedure (e.g. stress test, cath, DCCV, TEE, etc)?   Press F2        :[id number]}    Medication Adjustments/Labs and Tests Ordered: Current medicines are reviewed at length with the patient today.  Concerns regarding medicines are outlined above.  No orders of the defined types were placed in this encounter.  No orders of the defined types were placed in this encounter.   There are no Patient Instructions on file for this visit.   Signed, Freada Bergeron, MD  10/26/2020 1:28 PM    [place]

## 2020-10-28 ENCOUNTER — Ambulatory Visit: Payer: Medicare Other | Admitting: Cardiology

## 2020-11-15 NOTE — Progress Notes (Signed)
Cardiology Office Note:    Date:  11/17/2020   ID:  Jennifer Arroyo, DOB 06-24-1941, MRN [id number]  PCP:  Kathyrn Lass, MD  [facility] Cardiologist:  Freada Bergeron, MD  [facility] Electrophysiologist:  None   Referring MD: Kathyrn Lass, MD    History of Present Illness:    Jennifer Arroyo is a 79 y.o. female with a hx of HTN, HLD, dementia and recently diagnosed Afib who presents to clinic for follow-up of her newly diagnosed Afib.  History mainly obtained from her husband as patient has underlying baseline dementia.  Patient was recently hospitalized in October where she was brought to the hospital by her husband after experiencing several falls at home. On admission to the ED, she was noted to be in new Afib with RVR with HR 160s. She was initiated on a dilt gtt as well as apixaban for anticoagulation with conversion back to NSR. TTE with normal BiV function, no significant valvular abnormalities, normal LA and RA sizes. Discharged home on metop XL 50mg  daily as well as apixaban. MRI head showed chronic cortical stroke. She presents to clinic today for follow-up.  She left the hospital and was treated for a UTI with antibiotics and she has been doing well since. No chest pain, SOB, fevers, chills, melena, hematuria. No known recurrent episodes in Afib by pulse monitor. She has been taking her eliquis. Has some occasional bleeding when she blows her nose but no other bleeding issues. No LE edema, orthopnea, PND. Has been working with PT.   Past Medical History:  Diagnosis Date  . Bilateral bunions    hammer toe  . Cervical os stenosis   . Gallstones   . Hemangioma of liver    probable  . Hemorrhoids   . Hyperlipemia   . Hypertension   . Insomnia   . Left bundle branch block   . Left bundle branch block   . Low back pain   . Memory loss   . Plantar fasciitis   . Rectocele   . Renal cyst     Past Surgical History:  Procedure Laterality Date  . APPENDECTOMY     . COLONOSCOPY    . TONSILLECTOMY      Current Medications: Current Meds  Medication Sig  . BIOTIN PO Take 1 tablet by mouth daily.  . Calcium Carb-Cholecalciferol (CALCIUM 600-D PO) Take 1 tablet by mouth daily.  . Coenzyme Q10 (CO Q-10) 100 MG CAPS Take 100 mg by mouth daily.   . Cyanocobalamin (VITAMIN B-12 PO) Take 1 tablet by mouth daily.  . hydrochlorothiazide (HYDRODIURIL) 25 MG tablet Take 25 mg by mouth daily.  . Magnesium 250 MG TABS Take 750 mg by mouth daily.  . memantine (NAMENDA) 10 MG tablet Take 1 tablet (10 mg total) by mouth 2 (two) times daily. Please call [phone] to schedule appt.  . Omega-3 Fatty Acids (OMEGA 3 PO) Take 1,200 mg by mouth daily.  . rosuvastatin (CRESTOR) 10 MG tablet Take 10 mg by mouth daily.  . valACYclovir (VALTREX) 500 MG tablet Take 500 mg by mouth daily.   . [DISCONTINUED] apixaban (ELIQUIS) 5 MG TABS tablet Take 1 tablet (5 mg total) by mouth 2 (two) times daily.     Allergies:   Amoxicillin, Ceclor [cefaclor], Levaquin [levofloxacin], and Nystatin   Social History   Socioeconomic History  . Marital status: Married    Spouse name: Not on file  . Number of children: 0  . Years of  education: college  . Highest education level: Not on file  Occupational History  . Occupation: Retired Education officer, museum  Tobacco Use  . Smoking status: Former Smoker    Types: Cigarettes  . Smokeless tobacco: Never Used  . Tobacco comment: quit at age 37, 1 PPD x 10 year history  Vaping Use  . Vaping Use: Never used  Substance and Sexual Activity  . Alcohol use: Never    Comment: rare  . Drug use: Never  . Sexual activity: Not on file  Other Topics Concern  . Not on file  Social History Narrative   Lives at home with husband.   Right-handed.   2 cups caffeine daily.   Social Determinants of Health   Financial Resource Strain: Not on file  Food Insecurity: Not on file  Transportation Needs: Not on file  Physical Activity: Not on file   Stress: Not on file  Social Connections: Not on file     Family History: The patient's family history includes Heart disease in her mother; Hyperlipidemia in her brother; Hypertension in her brother; Liver cancer in her father; Rheumatic fever in her mother; Stroke in her mother.  ROS:   Please see the history of present illness.    Review of Systems  Constitutional: Negative for chills and fever.  HENT: Negative for hearing loss.   Eyes: Negative for blurred vision.  Respiratory: Negative for sputum production.   Cardiovascular: Negative for chest pain, palpitations, orthopnea, claudication, leg swelling and PND.  Gastrointestinal: Negative for heartburn, melena, nausea and vomiting.  Genitourinary: Negative for hematuria.  Musculoskeletal: Negative for falls.  Neurological: Negative for dizziness and loss of consciousness.  Endo/Heme/Allergies: Negative for polydipsia.  Psychiatric/Behavioral: Negative for substance abuse.    EKGs/Labs/Other Studies Reviewed:    The following studies were reviewed today: TTE 18-Oct-2020 IMPRESSIONS  1. Left ventricular ejection fraction, by estimation, is 60 to 65%. The  left ventricle has normal function. The left ventricle has no regional  wall motion abnormalities. There is mild left ventricular hypertrophy.  Left ventricular diastolic parameters  were normal.  2. Right ventricular systolic function is normal. The right ventricular  size is normal. Mildly increased right ventricular wall thickness. There  is normal pulmonary artery systolic pressure.  3. The mitral valve is normal in structure. No evidence of mitral valve  regurgitation. No evidence of mitral stenosis.  4. The aortic valve is grossly normal. There is mild calcification of the  aortic valve. Aortic valve regurgitation is not visualized. No aortic  stenosis is present.  5. The inferior vena cava is normal in size with greater than 50%  respiratory variability,  suggesting right atrial pressure of 3 mmHg.  6. Increased flow velocities may be secondary to anemia, thyrotoxicosis,  hyperdynamic or high flow state.   MRI Head 10-18-20: IMPRESSION: 1. No acute intracranial abnormality. 2. 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 3. Normal intracranial MRA.  EKG:  EKG is  ordered today.  The ekg ordered today demonstrates NSR, LBBB, with HR 65.  Recent Labs: 09/19/2020: ALT 23 Oct 18, 2020: Magnesium 2.0 09/21/2020: BUN 19; Creatinine, Ser 0.84; Hemoglobin 13.9; Platelets 213; Potassium 3.9; Sodium 139; TSH 0.923  Recent Lipid Panel    Component Value Date/Time   CHOL 135 2020-10-18 0418   TRIG 88 18-Oct-2020 0418   HDL 46 2020-10-18 0418   CHOLHDL 2.9 2020/10/18 0418   VLDL 18 10-18-20 0418   LDLCALC 71 10/18/2020 0418     Risk  Assessment/Calculations:    CHA2DS2-VASc Score = 7  This indicates a 11.2% annual risk of stroke. The patient's score is based upon: CHF History: No HTN History: Yes Diabetes History: No Stroke History: Yes Vascular Disease History: Yes Age Score: 2 Gender Score: 1    Physical Exam:    VS:  BP 118/80   Pulse 64   Ht 5\' 6"  (1.676 m)   Wt 192 lb (87.1 kg)   SpO2 97%   BMI 30.99 kg/m     Wt Readings from Last 3 Encounters:  11/17/20 192 lb (87.1 kg)  03/27/20 195 lb (88.5 kg)  01/11/19 171 lb 8 oz (77.8 kg)     GEN:  Well nourished, well developed in no acute distress. Has underlying dementia but answers simple questions appropriately HEENT: Normal NECK: No JVD; No carotid bruits LYMPHATICS: No lymphadenopathy CARDIAC: RRR, no murmurs, rubs, gallops RESPIRATORY:  Clear to auscultation without rales, wheezing or rhonchi  ABDOMEN: Soft, non-tender, non-distended MUSCULOSKELETAL:  No edema; No deformity  SKIN: Warm and dry NEUROLOGIC:  Alert, has baseline dementia but answering questions appropriately PSYCHIATRIC:  Normal affect   ASSESSMENT:    1. Atrial  fibrillation, unspecified type ([place])   2. Primary hypertension   3. Hyperlipidemia, unspecified hyperlipidemia type   4. 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 Patient with newly diagnosed Afib c/b CVA in 09/2020.  On apixaban for anticoagulation and metop for rate control. Doing well and in sinus rhythm today -Continue apixaban 5mg  BID -Continue metop 50mg  XL  #HTN Well controlled. -Continue metop 50mg  XL -Continue home HCTZ  #HLD Well controlled. LDL 71. -Continue rosuvastatin 10mg   #Mild carotid artery disease: -Continue crestor 10mg  daily -No ASA given need for AC -LDL 71  #History of  CVA Neurology following. MRI with chronic cortical stroke.  -Carotids with minimal disease -Continue crestor 10mg  -No ASA given need for [facility]    Medication Adjustments/Labs and Tests Ordered: Current medicines are reviewed at length with the patient today.  Concerns regarding medicines are outlined above.  Orders Placed This Encounter  Procedures  . EKG 12-Lead   Meds ordered this encounter  Medications  . apixaban (ELIQUIS) 5 MG TABS tablet    Sig: Take 1 tablet (5 mg total) by mouth 2 (two) times daily.    Dispense:  180 tablet    Refill:  3    Patient Instructions  Medication Instructions:  Your physician recommends that you continue on your current medications as directed. Please refer to the Current Medication list given to you today.  *If you need a refill on your cardiac medications before your next appointment, please call your pharmacy*   Lab Work:  none If you have labs (blood work) drawn today and your tests are completely normal, you will receive your results only by: Marland Kitchen MyChart Message (if you have MyChart) OR . A paper copy in the mail If you have any lab test that is abnormal or we need to change your treatment, we will call you to  review the results.   Testing/Procedures: none   Follow-Up: At [facility], you and your health needs are our priority.  As part of our continuing mission to provide you with exceptional heart care, we have created designated Provider Care Teams.  These Care Teams include your primary Cardiologist (physician) and Advanced Practice Providers (APPs -  Physician Assistants and Nurse Practitioners) who all work together to provide you with the care you need, when you need it.  We recommend signing up for the patient portal called "MyChart".  Sign up information is provided on this After Visit Summary.  MyChart is used to connect with patients for Virtual Visits (Telemedicine).  Patients are able to view lab/test results, encounter notes, upcoming appointments, etc.  Non-urgent messages can be sent to your provider as well.   To learn more about what you can do with MyChart, go to [email or web address].    Your next appointment:   6 months  The format for your next appointment:   In Person  Provider:   You may see Freada Bergeron, MD or one of the following Advanced Practice Providers on your designated Care Team:    Richardson Dopp, PA-C  Robbie Lis, [place]    Other Instructions      Signed, Freada Bergeron, MD  11/17/2020 11:26 AM    [place]

## 2020-11-17 ENCOUNTER — Encounter: Payer: Self-pay | Admitting: Cardiology

## 2020-11-17 ENCOUNTER — Ambulatory Visit: Payer: Medicare Other | Admitting: Cardiology

## 2020-11-17 ENCOUNTER — Other Ambulatory Visit: Payer: Self-pay

## 2020-11-17 VITALS — BP 118/80 | HR 64 | Ht 66.0 in | Wt 192.0 lb

## 2020-11-17 DIAGNOSIS — I10 Essential (primary) hypertension: Secondary | ICD-10-CM

## 2020-11-17 DIAGNOSIS — I6349 Cerebral infarction due to embolism of other cerebral artery: Secondary | ICD-10-CM

## 2020-11-17 DIAGNOSIS — I4891 Unspecified atrial fibrillation: Secondary | ICD-10-CM | POA: Diagnosis not present

## 2020-11-17 DIAGNOSIS — E785 Hyperlipidemia, unspecified: Secondary | ICD-10-CM

## 2020-11-17 MED ORDER — APIXABAN 5 MG PO TABS: 5.0000 mg | ORAL_TABLET | Freq: Two times a day (BID) | ORAL | 3 refills | Status: AC

## 2020-11-17 NOTE — Patient Instructions (Signed)
Medication Instructions:  Your physician recommends that you continue on your current medications as directed. Please refer to the Current Medication list given to you today.  *If you need a refill on your cardiac medications before your next appointment, please call your pharmacy*   Lab Work: none If you have labs (blood work) drawn today and your tests are completely normal, you will receive your results only by: . MyChart Message (if you have MyChart) OR . A paper copy in the mail If you have any lab test that is abnormal or we need to change your treatment, we will call you to review the results.   Testing/Procedures: none   Follow-Up: At [facility], you and your health needs are our priority.  As part of our continuing mission to provide you with exceptional heart care, we have created designated Provider Care Teams.  These Care Teams include your primary Cardiologist (physician) and Advanced Practice Providers (APPs -  Physician Assistants and Nurse Practitioners) who all work together to provide you with the care you need, when you need it.  We recommend signing up for the patient portal called "MyChart".  Sign up information is provided on this After Visit Summary.  MyChart is used to connect with patients for Virtual Visits (Telemedicine).  Patients are able to view lab/test results, encounter notes, upcoming appointments, etc.  Non-urgent messages can be sent to your provider as well.   To learn more about what you can do with MyChart, go to [email or web address].    Your next appointment:   6 month(s)  The format for your next appointment:   In Person  Provider:   You may see Heather E Pemberton, MD or one of the following Advanced Practice Providers on your designated Care Team:    Scott Weaver, PA-C  Vin Bhagat, PA-C    Other Instructions   

## 2020-12-09 ENCOUNTER — Institutional Professional Consult (permissible substitution): Payer: Medicare Other | Admitting: Neurology

## 2020-12-26 DIAGNOSIS — B0233 Zoster keratitis: Secondary | ICD-10-CM | POA: Diagnosis not present

## 2021-01-06 ENCOUNTER — Ambulatory Visit: Payer: Medicare Other | Admitting: Neurology

## 2021-01-06 ENCOUNTER — Other Ambulatory Visit: Payer: Self-pay

## 2021-01-06 ENCOUNTER — Encounter: Payer: Self-pay | Admitting: Neurology

## 2021-01-06 VITALS — BP 130/78 | HR 60 | Ht 66.0 in | Wt 195.5 lb

## 2021-01-06 DIAGNOSIS — R413 Other amnesia: Secondary | ICD-10-CM | POA: Diagnosis not present

## 2021-01-06 DIAGNOSIS — R269 Unspecified abnormalities of gait and mobility: Secondary | ICD-10-CM

## 2021-01-06 DIAGNOSIS — I639 Cerebral infarction, unspecified: Secondary | ICD-10-CM | POA: Diagnosis not present

## 2021-01-06 MED ORDER — MEMANTINE HCL 10 MG PO TABS: 10.0000 mg | ORAL_TABLET | Freq: Two times a day (BID) | ORAL | 4 refills | Status: AC

## 2021-01-06 NOTE — Progress Notes (Signed)
Chief Complaint  Patient presents with  . Consult    MMSE 22/30 - 6 animals. She is here with her husband, Jennifer Arroyo. Previously seen for memory loss. She is following up today post-hospitalization for CVA in October 2021.Taking Eliquis 5mg  daily. She is using a rolling walker for assistance.     HISTORICAL: Jennifer Arroyo a 80 year old female, seen in request by her primary care physician Dr. Sabra Heck, Lattie Haw for evaluation of memory loss, gait abnormality, she is accompanied by her husband at today's visit, initial evaluation was on January 11, 2019.  I have reviewed and summarized the referring note from the referring physician. 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, has no children, enjoys playing bridges regularly, her older brother suffered vascular dementia, mother died at 48, suffered stroke after giving birth to her  She was noted to have gradual onset memory loss since 2018, getting worse in 2019, it is hard for her to keep up with her bridge game, difficulty concentrate, following conversations, no longer active at home, tends to stay up late, and get up late during the day,  She has history of chronic low back pain, deformity of bilateral feet, gradually increased gait abnormality  Laboratory evaluation in January 2020, showed LDL 92, normal CMP creatinine of 0.74, CBC hemoglobin of 15, vitamin B12 429, normal TSH 1.78  MRI of the brain on January 26, 2019, moderate generalized atrophy, most severe in the anterior mesial temporal lobe bilaterally, symmetric T2 hyperintensity of the corticospinal tract in the brain and brainstem, with superimposed chronic microvascular ischemic changes.  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  ALLERGIES: Allergies  Allergen Reactions  . Amoxicillin Nausea Only  . Ceclor [Cefaclor] Hives  . Levaquin [Levofloxacin] Other (See Comments)  . Nystatin Other (See Comments)    Black scale around teeth.    HOME MEDICATIONS: Current Outpatient Medications  Medication Sig Dispense Refill  . apixaban (ELIQUIS) 5 MG TABS tablet Take 1 tablet (5 mg total) by mouth 2 (two) times daily. 180 tablet 3  . BIOTIN PO Take 1 tablet by mouth daily.    . Calcium Carb-Cholecalciferol (CALCIUM 600-D PO) Take 1 tablet by mouth daily.    . Coenzyme Q10 (CO Q-10) 100 MG  CAPS Take 100 mg by mouth daily.     . Cyanocobalamin (VITAMIN B-12 PO) Take 1 tablet by mouth daily.    . hydrochlorothiazide (HYDRODIURIL) 25 MG tablet Take 25 mg by mouth daily.    . Magnesium 250 MG TABS Take 750 mg by mouth daily.    . memantine (NAMENDA) 10 MG tablet Take 1 tablet (10  mg total) by mouth 2 (two) times daily. Please call [phone] to schedule appt. 180 tablet 4  . Omega-3 Fatty Acids (OMEGA 3 PO) Take 1,200 mg by mouth daily.    . rosuvastatin (CRESTOR) 10 MG tablet Take 10 mg by mouth daily.    . valACYclovir (VALTREX) 500 MG tablet Take 500 mg by mouth daily.     . metoprolol succinate (TOPROL-XL) 50 MG 24 hr tablet Take 1 tablet (50 mg total) by mouth daily. 30 tablet 0   No current facility-administered medications for this visit.    PAST MEDICAL HISTORY: Past Medical History:  Diagnosis Date  . Bilateral bunions    hammer toe  . Cervical os stenosis   . CVA (cerebral vascular accident) ([place])   . Gallstones   . Hemangioma of liver    probable  . Hemorrhoids   . Hyperlipemia   . Hypertension   . Insomnia   . Left bundle branch block   . Left bundle branch block   . Low back pain   . Memory loss   . Plantar fasciitis   . Rectocele   . Renal cyst     PAST SURGICAL HISTORY: Past Surgical History:  Procedure Laterality Date  . APPENDECTOMY    . COLONOSCOPY    . TONSILLECTOMY      FAMILY HISTORY: Family History  Problem Relation Age of Onset  . Liver cancer Father   . Hypertension Brother   . Hyperlipidemia Brother   . Stroke Mother   . Rheumatic fever Mother   . Heart disease Mother     SOCIAL HISTORY: Social History   Socioeconomic History  . Marital status: Married    Spouse name: Not on file  . Number of children: 0  . Years of education: college  . Highest education level: Not on file  Occupational History  . Occupation: Retired Education officer, museum  Tobacco Use  . Smoking status: Former Smoker    Types: Cigarettes  . Smokeless tobacco: Never Used  . Tobacco comment: quit at age 34, 1 PPD x 10 year history  Vaping Use  . Vaping Use: Never used  Substance and Sexual Activity  . Alcohol use: Not Currently  . Drug use: Never  . Sexual activity: Not on file  Other Topics Concern  . Not on file  Social History  Narrative   Lives at home with husband.   Right-handed.   2 cups caffeine daily.   Social Determinants of Health   Financial Resource Strain: Not on file  Food Insecurity: Not on file  Transportation Needs: Not on file  Physical Activity: Not on file  Stress: Not on file  Social Connections: Not on file  Intimate Partner Violence: Not on file     PHYSICAL EXAM   Vitals:   01/06/21 1411  BP: 130/78  Pulse: 60  Weight: 195 lb 8 oz (88.7 kg)  Height: 5\' 6"  (1.676 m)   Not recorded     Body mass index is 31.55 kg/m.  PHYSICAL EXAMNIATION:  Gen: NAD, conversant, well nourised, well groomed  Cardiovascular: Regular rate rhythm, no peripheral edema, warm, nontender. Eyes: Conjunctivae clear without exudates or hemorrhage Neck: Supple, no carotid bruits. Pulmonary: Clear to auscultation bilaterally   NEUROLOGICAL EXAM:  MMSE - Mini Mental State Exam 01/06/2021 03/27/2020 01/11/2019  Orientation to time 2 1 5   Orientation to Place 5 4 5   Registration 3 3 3   Attention/ Calculation 3 5 5   Recall 0 2 0  Language- name 2 objects 2 1 2   Language- repeat 1 1 1   Language- follow 3 step command 3 3 3   Language- read & follow direction 1 1 1   Write a sentence 1 1 1   Copy design 1 1 1   Copy design-comments - 4 animals -  Total score 22 23 27   animal 6.   CRANIAL NERVES: CN II: Visual fields are full to confrontation. Pupils are round equal and briskly reactive to light. CN III, IV, VI: extraocular movement are normal. No ptosis. CN V: Facial sensation is intact to light touch CN VII: Face is symmetric with normal eye closure  CN VIII: Hearing is normal to causal conversation. CN IX, X: Phonation is normal. CN XI: Head turning and shoulder shrug are intact  MOTOR: There is no pronator drift of out-stretched arms. Muscle bulk and tone are normal. Muscle strength is normal.  REFLEXES: Reflexes are 2+ and symmetric at the biceps, triceps, knees, and  ankles. Plantar responses are flexor.  SENSORY: Intact to light touch, pinprick and vibratory sensation are intact in fingers and toes.  COORDINATION: There is no trunk or limb dysmetria noted.  GAIT/STANCE: She needs push-up to get up from seated position, cautious, unsteady  DIAGNOSTIC DATA (LABS, IMAGING, TESTING) - I reviewed patient records, labs, notes, testing and imaging myself where available.   ASSESSMENT AND PLAN  DAMARYS LOCKREM is a 80 y.o. female   Stroke  Involving right parietal region, also had a supratentorium small vessel disease, new onset atrial fibrillation, on Eliquis now,  Vascular risk factor of hypertension, hyperlipidemia  History of right V1 shingles,  Under close supervision of ophthalmologist, still on Valtrex 500 mg daily  Dementia, progressively worse  Refilled her Namenda 10 mg twice  Continue moderate exercise  Total time spent reviewing the chart, obtaining history, examined patient, ordering tests, documentation, consultations and family, care coordination was 49 minutes   Marcial Pacas, M.D. Ph.D.  [facility] [address], [place], [place] [address] Ph: [phone] Fax: [phone]  CC:  Kathyrn Lass, MD [place],  [place] [id number]  Kathyrn Lass, MD

## 2021-02-05 ENCOUNTER — Emergency Department (HOSPITAL_COMMUNITY)
Admission: EM | Admit: 2021-02-05 | Discharge: 2021-02-05 | Disposition: A | Discharge status: Home / Self Care | Payer: Medicare Other | Attending: Emergency Medicine | Admitting: Emergency Medicine

## 2021-02-05 ENCOUNTER — Encounter (HOSPITAL_COMMUNITY): Payer: Self-pay | Admitting: Emergency Medicine

## 2021-02-05 ENCOUNTER — Emergency Department (HOSPITAL_COMMUNITY): Payer: Medicare Other

## 2021-02-05 ENCOUNTER — Other Ambulatory Visit: Payer: Self-pay

## 2021-02-05 DIAGNOSIS — W01198A Fall on same level from slipping, tripping and stumbling with subsequent striking against other object, initial encounter: Secondary | ICD-10-CM | POA: Diagnosis not present

## 2021-02-05 DIAGNOSIS — Z743 Need for continuous supervision: Secondary | ICD-10-CM | POA: Diagnosis not present

## 2021-02-05 DIAGNOSIS — S0101XA Laceration without foreign body of scalp, initial encounter: Secondary | ICD-10-CM | POA: Diagnosis not present

## 2021-02-05 DIAGNOSIS — E876 Hypokalemia: Secondary | ICD-10-CM

## 2021-02-05 DIAGNOSIS — S0990XA Unspecified injury of head, initial encounter: Secondary | ICD-10-CM | POA: Diagnosis not present

## 2021-02-05 DIAGNOSIS — M47812 Spondylosis without myelopathy or radiculopathy, cervical region: Secondary | ICD-10-CM | POA: Insufficient documentation

## 2021-02-05 DIAGNOSIS — Z79899 Other long term (current) drug therapy: Secondary | ICD-10-CM | POA: Diagnosis not present

## 2021-02-05 DIAGNOSIS — W19XXXA Unspecified fall, initial encounter: Secondary | ICD-10-CM | POA: Diagnosis not present

## 2021-02-05 DIAGNOSIS — I10 Essential (primary) hypertension: Secondary | ICD-10-CM | POA: Insufficient documentation

## 2021-02-05 DIAGNOSIS — R6889 Other general symptoms and signs: Secondary | ICD-10-CM | POA: Diagnosis not present

## 2021-02-05 DIAGNOSIS — Z23 Encounter for immunization: Secondary | ICD-10-CM | POA: Diagnosis not present

## 2021-02-05 DIAGNOSIS — Z7901 Long term (current) use of anticoagulants: Secondary | ICD-10-CM | POA: Insufficient documentation

## 2021-02-05 DIAGNOSIS — M542 Cervicalgia: Secondary | ICD-10-CM | POA: Diagnosis not present

## 2021-02-05 DIAGNOSIS — Z87891 Personal history of nicotine dependence: Secondary | ICD-10-CM | POA: Diagnosis not present

## 2021-02-05 DIAGNOSIS — R0902 Hypoxemia: Secondary | ICD-10-CM | POA: Diagnosis not present

## 2021-02-05 DIAGNOSIS — Y92000 Kitchen of unspecified non-institutional (private) residence as  the place of occurrence of the external cause: Secondary | ICD-10-CM | POA: Insufficient documentation

## 2021-02-05 DIAGNOSIS — M4302 Spondylolysis, cervical region: Secondary | ICD-10-CM | POA: Diagnosis not present

## 2021-02-05 HISTORY — DX: Essential (primary) hypertension: I10

## 2021-02-05 LAB — CBC WITH DIFFERENTIAL/PLATELET
Abs Immature Granulocytes: 0.1 10*3/uL — ABNORMAL HIGH (ref 0.00–0.07)
Basophils Absolute: 0.1 10*3/uL (ref 0.0–0.1)
Basophils Relative: 1 %
Eosinophils Absolute: 0.2 10*3/uL (ref 0.0–0.5)
Eosinophils Relative: 3 %
HCT: 50.1 % — ABNORMAL HIGH (ref 36.0–46.0)
Hemoglobin: 17.5 g/dL — ABNORMAL HIGH (ref 12.0–15.0)
Immature Granulocytes: 1 %
Lymphocytes Relative: 18 %
Lymphs Abs: 1.7 10*3/uL (ref 0.7–4.0)
MCH: 34.4 pg — ABNORMAL HIGH (ref 26.0–34.0)
MCHC: 34.9 g/dL (ref 30.0–36.0)
MCV: 98.6 fL (ref 80.0–100.0)
Monocytes Absolute: 0.6 10*3/uL (ref 0.1–1.0)
Monocytes Relative: 6 %
Neutro Abs: 6.8 10*3/uL (ref 1.7–7.7)
Neutrophils Relative %: 71 %
Platelets: 226 10*3/uL (ref 150–400)
RBC: 5.08 MIL/uL (ref 3.87–5.11)
RDW: 12.2 % (ref 11.5–15.5)
WBC: 9.5 10*3/uL (ref 4.0–10.5)
nRBC: 0 % (ref 0.0–0.2)

## 2021-02-05 LAB — CBG MONITORING, ED: Glucose-Capillary: 165 mg/dL — ABNORMAL HIGH (ref 70–99)

## 2021-02-05 LAB — URINALYSIS, ROUTINE W REFLEX MICROSCOPIC
Bilirubin Urine: NEGATIVE
Glucose, UA: NEGATIVE mg/dL
Hgb urine dipstick: NEGATIVE
Ketones, ur: 5 mg/dL — AB
Leukocytes,Ua: NEGATIVE
Nitrite: NEGATIVE
Protein, ur: NEGATIVE mg/dL
Specific Gravity, Urine: 1.019 (ref 1.005–1.030)
pH: 6 (ref 5.0–8.0)

## 2021-02-05 LAB — COMPREHENSIVE METABOLIC PANEL
ALT: 27 U/L (ref 0–44)
AST: 25 U/L (ref 15–41)
Albumin: 3.5 g/dL (ref 3.5–5.0)
Alkaline Phosphatase: 65 U/L (ref 38–126)
Anion gap: 15 (ref 5–15)
BUN: 20 mg/dL (ref 8–23)
CO2: 18 mmol/L — ABNORMAL LOW (ref 22–32)
Calcium: 9.7 mg/dL (ref 8.9–10.3)
Chloride: 104 mmol/L (ref 98–111)
Creatinine, Ser: 0.93 mg/dL (ref 0.44–1.00)
GFR, Estimated: >60 mL/min (ref >60)
Glucose, Bld: 163 mg/dL — ABNORMAL HIGH (ref 70–99)
Potassium: 3.1 mmol/L — ABNORMAL LOW (ref 3.5–5.1)
Sodium: 137 mmol/L (ref 135–145)
Total Bilirubin: 1.1 mg/dL (ref 0.3–1.2)
Total Protein: 7 g/dL (ref 6.5–8.1)

## 2021-02-05 MED ORDER — TETANUS-DIPHTH-ACELL PERTUSSIS 5-2.5-18.5 LF-MCG/0.5 IM SUSY
0.5000 mL | PREFILLED_SYRINGE | Freq: Once | INTRAMUSCULAR | Status: AC
  Administered 2021-02-05: 0.5 mL via INTRAMUSCULAR
  Filled 2021-02-05: qty 0.5

## 2021-02-05 MED ORDER — FENTANYL CITRATE (PF) 100 MCG/2ML IJ SOLN
50.0000 ug | Freq: Once | INTRAMUSCULAR | Status: AC
Start: 2021-02-05 — End: 2021-02-05
  Administered 2021-02-05: 50 ug via INTRAVENOUS
  Filled 2021-02-05: qty 2

## 2021-02-05 MED ORDER — POTASSIUM CHLORIDE CRYS ER 20 MEQ PO TBCR: 20.0000 meq | EXTENDED_RELEASE_TABLET | Freq: Every day | ORAL | 1 refills | Status: AC

## 2021-02-05 NOTE — ED Notes (Signed)
Patient transported to CT 

## 2021-02-05 NOTE — ED Triage Notes (Signed)
Pt arrives from home via EMS with complaints of fall on Eliquis. Witnessed mechanical fall by husband. No LOC. Pt fell backwards on kitchen floor, hitting head.  Laceration noted on back of head. Bleeding controlled. Pt at baseline of A/O X1 to self.   130/90 HR 70 97% CBG

## 2021-02-05 NOTE — ED Notes (Signed)
Got patient on the monitor did ekg shown to Dr Wentz patient is resting with call bell in reach 

## 2021-02-05 NOTE — ED Provider Notes (Signed)
[facility] EMERGENCY DEPARTMENT Provider Note   CSN: [id number] Arrival date & time: 02/05/21  0258     History Chief Complaint  Patient presents with  . Fall    Jennifer Arroyo is a 80 y.o. female.  HPI Patient presents via EMS as a level 2 trauma because she takes Eliquis, following ground-level fall.  Fall was witnessed by her husband, states she fell backwards striking her head.  There is no report of loss of consciousness.  Patient is able to give any history.  Level 5 Caveat-confusion    Past Medical History:  Diagnosis Date  . Hypertension   . Stroke ([place]) 01/2018    There are no problems to display for this patient.   History reviewed. No pertinent surgical history.   OB History    Gravida  0   Para  0   Term  0   Preterm  0   AB  0   Living  0     SAB  0   IAB  0   Ectopic  0   Multiple  0   Live Births  0           Family History  Problem Relation Age of Onset  . Stroke Mother   . Cancer Father   . Healthy Brother     Social History   Tobacco Use  . Smoking status: Former Smoker    Packs/day: 0.50    Types: Cigarettes    Quit date: 12/08/1978    Years since quitting: 42.1  Substance Use Topics  . Alcohol use: Never  . Drug use: Yes    Comment: back in the 1980s. hasnt smoked since then    Home Medications Prior to Admission medications   Medication Sig Start Date End Date Taking? Authorizing Provider  ELIQUIS 5 MG TABS tablet Take 5 mg by mouth 2 (two) times daily. 11/18/20   [provider]  hydrochlorothiazide (HYDRODIURIL) 25 MG tablet Take 25 mg by mouth daily. 01/28/21   [provider]  memantine (NAMENDA) 10 MG tablet Take 10 mg by mouth 2 (two) times daily. 12/26/20   [provider]    Allergies    Patient has no known allergies.  Review of Systems   Review of Systems  Unable to perform ROS: Mental status change    Physical Exam Updated Vital Signs BP (!)  144/84 (BP Location: Right Arm)   Pulse 62   Temp 97.8 F (36.6 C) (Oral)   Resp 10   Ht 5\' 6"  (1.676 m)   Wt 77.1 kg   SpO2 97%   BMI 27.44 kg/m   Physical Exam Vitals and nursing note reviewed.  Constitutional:      General: She is not in acute distress.    Appearance: Normal appearance. She is well-developed and well-nourished. She is obese. She is not ill-appearing, toxic-appearing or diaphoretic.  HENT:     Head: Normocephalic and atraumatic.     Comments: Contusion with abrasion;  Mid parietal/occipital region.  No active bleeding.    Right Ear: External ear normal.     Left Ear: External ear normal.     Mouth/Throat:     Pharynx: No oropharyngeal exudate or posterior oropharyngeal erythema.  Eyes:     Extraocular Movements: EOM normal.     Conjunctiva/sclera: Conjunctivae normal.     Pupils: Pupils are equal, round, and reactive to light.  Neck:     Trachea: Phonation normal.  Cardiovascular:     Rate and Rhythm: Normal rate and regular rhythm.  Pulmonary:     Effort: Pulmonary effort is normal.  Chest:     Chest wall: No tenderness or bony tenderness.  Abdominal:     General: There is no distension.     Palpations: Abdomen is soft.     Tenderness: There is no abdominal tenderness.  Musculoskeletal:        General: No swelling or tenderness. Normal range of motion.     Cervical back: Normal range of motion and neck supple.  Skin:    General: Skin is warm, dry and intact.  Neurological:     Mental Status: She is alert.     Cranial Nerves: No cranial nerve deficit.     Sensory: No sensory deficit.     Motor: No abnormal muscle tone.     Coordination: Coordination normal.     Comments: Follows commands accurately.  Normal grip strength hands bilaterally.  Able to independently raise both legs off the stretcher.  Psychiatric:        Mood and Affect: Mood and affect and mood normal.        Behavior: Behavior normal.     ED Results / Procedures / Treatments    Labs (all labs ordered are listed, but only abnormal results are displayed) Labs Reviewed  COMPREHENSIVE METABOLIC PANEL - Abnormal; Notable for the following components:      Result Value   Potassium 3.1 (*)    CO2 18 (*)    Glucose, Bld 163 (*)    All other components within normal limits  CBC WITH DIFFERENTIAL/PLATELET - Abnormal; Notable for the following components:   Hemoglobin 17.5 (*)    HCT 50.1 (*)    MCH 34.4 (*)    Abs Immature Granulocytes 0.10 (*)    All other components within normal limits  URINALYSIS, ROUTINE W REFLEX MICROSCOPIC - Abnormal; Notable for the following components:   APPearance HAZY (*)    Ketones, ur 5 (*)    All other components within normal limits  CBG MONITORING, ED - Abnormal; Notable for the following components:   Glucose-Capillary 165 (*)    All other components within normal limits    EKG None    Date: 02/05/21  Rate: 81  Rhythm: normal sinus rhythm  QRS Axis: normal  PR and QT Intervals: normal  ST/T Wave abnormalities: normal  PR and QRS Conduction Disutrbances:left bundle branch block     Radiology CT Head Wo Contrast  Result Date: 02/05/2021 CLINICAL DATA:  Poly trauma critical, head/cervical spine injury suspected. Additional history provided: Unwitnessed fall at home hitting occipital region on kitchen counter, laceration at this site, posterior neck pain, history of dementia. EXAM: CT HEAD WITHOUT CONTRAST CT CERVICAL SPINE WITHOUT CONTRAST TECHNIQUE: Multidetector CT imaging of the head and cervical spine was performed following the standard protocol without intravenous contrast. Multiplanar CT image reconstructions of the cervical spine were also generated. COMPARISON:  Brain MRI 09/20/2020. FINDINGS: CT HEAD FINDINGS Brain: Moderate cerebral atrophy.  Comparatively mild cerebellar atrophy. Redemonstrated chronic cortically based infarcts within the right parietal lobe and bilateral insula. Redemonstrated chronic small-vessel  infarcts within the posterior limbs of the internal capsules bilaterally. Background mild patchy hypoattenuation within the cerebral white matter is nonspecific, but compatible with chronic small vessel ischemic disease. There is no acute intracranial hemorrhage. No acute demarcated cortical infarct. No extra-axial fluid collection. No evidence of intracranial mass. No midline shift. Vascular: No hyperdense  vessel.  Atherosclerotic calcifications Skull: Normal. Negative for fracture or focal lesion. Sinuses/Orbits: Visualized orbits show no acute finding. Trace bilateral ethmoid and right sphenoid sinus mucosal thickening at the imaged levels. Other: Midline parietooccipital scalp hematoma and laceration. CT CERVICAL SPINE FINDINGS Alignment: Straightening of the expected cervical lordosis trace C3-C4 grade 1 anterolisthesis. Skull base and vertebrae: The basion-dental and atlanto-dental intervals are maintained.No evidence of acute fracture to the cervical spine. Soft tissues and spinal canal: No prevertebral fluid or swelling. No visible canal hematoma. Disc levels: Advanced cervical spondylosis with multilevel disc space narrowing, disc bulges, posterior disc osteophytes, uncovertebral hypertrophy and facet arthrosis. Fusion across the C2-C3 disc space posteriorly. Additionally, there is facet joint ankylosis on the right at C2-C3. Multilevel spinal canal and neural foraminal narrowing. Most notably, posterior disc osteophytes contribute to apparent severe spinal canal stenosis at C5-C6 and C6-C7. T2 superior endplate Schmorl node. Upper chest: No consolidation within the imaged lung apices. No visible pneumothorax. Other: Multiple thyroid nodules, the largest an exophytic thyroid nodule arising from the inferior left thyroid lobe measuring 19 mm (series 1, image 73) IMPRESSION: CT head: 1. No evidence of acute intracranial abnormality. 2. Midline parietooccipital scalp hematoma and laceration. 3. Redemonstrated  remote cortically based infarcts within the right parietal lobe and bilateral insula. 4. Redemonstrated chronic small-vessel infarcts within the internal capsules bilaterally. 5. Stable atrophy and chronic small vessel ischemic disease. CT cervical spine: 1. No evidence of acute fracture to the cervical spine. 2. Nonspecific straightening of the expected cervical lordosis. 3. Trace C3-C4 grade 1 anterolisthesis. 4. Advanced cervical spondylosis with multilevel spinal canal and neural foraminal narrowing. Notably, posterior disc osteophytes contribute to apparent severe spinal canal stenosis at C5-C6 and C6-C7. 5. Multiple thyroid nodules, the largest an exophytic nodule arising from the inferior left thyroid lobe measuring 19 mm. Non-emergent thyroid ultrasound recommended for further evaluation. Electronically Signed   By: Kellie Simmering DO   On: 02/05/2021 09:35   CT Cervical Spine Wo Contrast  Result Date: 02/05/2021 CLINICAL DATA:  Poly trauma critical, head/cervical spine injury suspected. Additional history provided: Unwitnessed fall at home hitting occipital region on kitchen counter, laceration at this site, posterior neck pain, history of dementia. EXAM: CT HEAD WITHOUT CONTRAST CT CERVICAL SPINE WITHOUT CONTRAST TECHNIQUE: Multidetector CT imaging of the head and cervical spine was performed following the standard protocol without intravenous contrast. Multiplanar CT image reconstructions of the cervical spine were also generated. COMPARISON:  Brain MRI 09/20/2020. FINDINGS: CT HEAD FINDINGS Brain: Moderate cerebral atrophy.  Comparatively mild cerebellar atrophy. Redemonstrated chronic cortically based infarcts within the right parietal lobe and bilateral insula. Redemonstrated chronic small-vessel infarcts within the posterior limbs of the internal capsules bilaterally. Background mild patchy hypoattenuation within the cerebral white matter is nonspecific, but compatible with chronic small vessel  ischemic disease. There is no acute intracranial hemorrhage. No acute demarcated cortical infarct. No extra-axial fluid collection. No evidence of intracranial mass. No midline shift. Vascular: No hyperdense vessel.  Atherosclerotic calcifications Skull: Normal. Negative for fracture or focal lesion. Sinuses/Orbits: Visualized orbits show no acute finding. Trace bilateral ethmoid and right sphenoid sinus mucosal thickening at the imaged levels. Other: Midline parietooccipital scalp hematoma and laceration. CT CERVICAL SPINE FINDINGS Alignment: Straightening of the expected cervical lordosis trace C3-C4 grade 1 anterolisthesis. Skull base and vertebrae: The basion-dental and atlanto-dental intervals are maintained.No evidence of acute fracture to the cervical spine. Soft tissues and spinal canal: No prevertebral fluid or swelling. No visible canal hematoma. Disc levels: Advanced  cervical spondylosis with multilevel disc space narrowing, disc bulges, posterior disc osteophytes, uncovertebral hypertrophy and facet arthrosis. Fusion across the C2-C3 disc space posteriorly. Additionally, there is facet joint ankylosis on the right at C2-C3. Multilevel spinal canal and neural foraminal narrowing. Most notably, posterior disc osteophytes contribute to apparent severe spinal canal stenosis at C5-C6 and C6-C7. T2 superior endplate Schmorl node. Upper chest: No consolidation within the imaged lung apices. No visible pneumothorax. Other: Multiple thyroid nodules, the largest an exophytic thyroid nodule arising from the inferior left thyroid lobe measuring 19 mm (series 1, image 73) IMPRESSION: CT head: 1. No evidence of acute intracranial abnormality. 2. Midline parietooccipital scalp hematoma and laceration. 3. Redemonstrated remote cortically based infarcts within the right parietal lobe and bilateral insula. 4. Redemonstrated chronic small-vessel infarcts within the internal capsules bilaterally. 5. Stable atrophy and  chronic small vessel ischemic disease. CT cervical spine: 1. No evidence of acute fracture to the cervical spine. 2. Nonspecific straightening of the expected cervical lordosis. 3. Trace C3-C4 grade 1 anterolisthesis. 4. Advanced cervical spondylosis with multilevel spinal canal and neural foraminal narrowing. Notably, posterior disc osteophytes contribute to apparent severe spinal canal stenosis at C5-C6 and C6-C7. 5. Multiple thyroid nodules, the largest an exophytic nodule arising from the inferior left thyroid lobe measuring 19 mm. Non-emergent thyroid ultrasound recommended for further evaluation. Electronically Signed   By: Kellie Simmering DO   On: 02/05/2021 09:35    Procedures .Marland KitchenLaceration Repair  Date/Time: 02/05/2021 10:55 AM Performed by: Daleen Bo, MD Authorized by: Daleen Bo, MD   Consent:    Consent obtained:  Verbal   Consent given by:  Spouse   Risks, benefits, and alternatives were discussed: yes     Risks discussed:  Pain, poor cosmetic result and poor wound healing   Alternatives discussed:  No treatment Universal protocol:    Immediately prior to procedure, a time out was called: yes   Anesthesia:    Anesthesia method:  None Laceration details:    Location:  Scalp   Scalp location:  Occipital   Length (cm):  2   Depth (mm):  9 Pre-procedure details:    Preparation:  Imaging obtained to evaluate for foreign bodies Exploration:    Limited defect created (wound extended): no     Hemostasis achieved with:  Direct pressure   Imaging outcome: foreign body not noted     Wound exploration: wound explored through full range of motion     Wound extent: no foreign bodies/material noted, no muscle damage noted, no underlying fracture noted and no vascular damage noted     Contaminated: no   Treatment:    Area cleansed with:  Saline   Amount of cleaning:  Standard   Irrigation solution:  Sterile saline   Irrigation volume:  20cc   Visualized foreign bodies/material  removed: no   Skin repair:    Repair method:  Staples   Number of staples:  2 Approximation:    Approximation:  Loose Repair type:    Repair type:  Simple Post-procedure details:    Dressing:  Antibiotic ointment and non-adherent dressing   Procedure completion:  Tolerated well, no immediate complications     Medications Ordered in ED Medications  fentaNYL (SUBLIMAZE) injection 50 mcg (50 mcg Intravenous Given 02/05/21 0936)  Tdap (BOOSTRIX) injection 0.5 mL (0.5 mLs Intramuscular Given 02/05/21 1128)    ED Course  I have reviewed the triage vital signs and the nursing notes.  Pertinent labs & imaging results that  were available during my care of the patient were reviewed by me and considered in my medical decision making (see chart for details).  Clinical Course as of 02/05/21 1418  Thu Feb 05, 2021  0834 Husband is now here and is concerned that she is in pain.  He also notes that she has complained of right shoulder pain which was not clear initially.  He describes a ground-level fall, he heard her and went to her and found her conscious.  He thinks she fell because of her gait instability and dementia.  Her walker was nearby after he found her.  He thinks she was probably pouring some milk, when she fell. [EW]    Clinical Course User Index [EW] Daleen Bo, MD   MDM Rules/Calculators/A&P                           Patient Vitals for the past 24 hrs:  BP Temp Temp src Pulse Resp SpO2 Height Weight  02/05/21 1121 (!) 144/84 97.8 F (36.6 C) Oral 62 10 97 % -- --  02/05/21 1100 (!) 144/84 -- -- 68 15 95 % -- --  02/05/21 1045 -- -- -- -- 13 -- -- --  02/05/21 1030 126/83 -- -- 60 14 98 % -- --  02/05/21 1015 -- -- -- -- 13 -- -- --  02/05/21 1000 140/76 -- -- -- 16 -- -- --  02/05/21 0945 -- -- -- -- 15 -- -- --  02/05/21 0939 134/79 -- -- 65 14 96 % -- --  02/05/21 0930 -- -- -- -- 14 -- -- --  02/05/21 0915 -- -- -- -- 16 -- -- --  02/05/21 0845 -- -- -- 66 16 95 %  -- --  02/05/21 0834 -- -- -- -- -- -- 5\' 6"  (1.676 m) 77.1 kg  02/05/21 0832 124/77 97.8 F (36.6 C) Oral 64 16 94 % -- --  02/05/21 0830 124/77 -- -- 60 18 94 % -- --    10:54 AM Reevaluation with update and discussion. After initial assessment and treatment, an updated evaluation reveals she is calm comfortable.  Findings discussed with husband, all questions answered. Daleen Bo   Medical Decision Making:  This patient is presenting for evaluation of injury from ground-level fall, which does require a range of treatment options, and is a complaint that involves a high risk of morbidity and mortality. The differential diagnoses include intracranial injury, fractures, spine injury. I decided to review old records, and in summary elderly female with dementia fell, well working in the kitchen.  Suspect a mechanical fall associated with her chronic disability.  She was not actively using her walker when she fell.  I obtained additional historical information from husband at bedside.  Clinical Laboratory Tests Ordered, included CBC, Metabolic panel and Urinalysis. Review indicates anoxia potassium slightly low, CO2 low, glucose high. Radiologic Tests Ordered, included CT head and cervical spine.  I independently Visualized: Radiograph images, which show no acute injury  Cardiac Monitor Tracing which shows normal sinus rhythm   Critical Interventions-EPIC evaluation, laboratory testing, CT imaging, laceration repair, observation and reassessment  After These Interventions, the Patient was reevaluated and was found stable for discharge.  Fall likely mechanical without serious injury.  Incidental hypokalemia, currently on hydrochlorothiazide.  Prescription for potassium sent to her pharmacy.  CRITICAL CARE-no Performed by: Daleen Bo  Nursing Notes Reviewed/ Care Coordinated Applicable Imaging Reviewed Interpretation of Laboratory Data incorporated into  ED treatment  The patient  appears reasonably screened and/or stabilized for discharge and I doubt any other medical condition or other [facility] requiring further screening, evaluation, or treatment in the ED at this time prior to discharge.  Plan: Home Medications-continue current; Home Treatments-rest, fluids, wound care; return here if the recommended treatment, does not improve the symptoms; Recommended follow up-remove staples 7 to 10 days.  2:15 PM-contact the patient husband to inform him about hypokalemia.  He requested I sent a prescription to her pharmacy which I have done.  I recommended that he arrange follow-up with her PCP to check on the potassium within 1 month.     Final Clinical Impression(s) / ED Diagnoses Final diagnoses:  Injury of head, initial encounter  Fall, initial encounter  Laceration of occipital region of scalp, initial encounter  Spondylosis of cervical region without myelopathy or radiculopathy  Hypokalemia    Rx / DC Orders ED Discharge Orders    None       Daleen Bo, MD 02/05/21 1420

## 2021-02-05 NOTE — ED Notes (Signed)
Pt returned from CT °

## 2021-02-05 NOTE — Discharge Instructions (Addendum)
There were no serious injuries from the fall.  Keep the wound of her scalp clean with soap and water daily and apply bacitracin.  Have the staples removed in 7 to 10 days.  Return here or see your doctor as needed for problems.

## 2021-02-06 ENCOUNTER — Encounter: Payer: Self-pay | Admitting: Neurology

## 2021-02-06 DIAGNOSIS — N390 Urinary tract infection, site not specified: Secondary | ICD-10-CM | POA: Diagnosis not present

## 2021-02-12 ENCOUNTER — Telehealth: Payer: Self-pay | Admitting: Cardiology

## 2021-02-12 MED ORDER — METOPROLOL SUCCINATE ER 50 MG PO TB24: 50.0000 mg | ORAL_TABLET | Freq: Every day | ORAL | 2 refills | Status: DC

## 2021-02-12 NOTE — Telephone Encounter (Signed)
*  STAT* If patient is at the pharmacy, call can be transferred to refill team.   1. Which medications need to be refilled? (please list name of each medication and dose if known) metoprolol succinate (TOPROL-XL) 50 MG 24 hr tablet(Expired)  2. Which pharmacy/location (including street and city if local pharmacy) is medication to be sent to? [id number] - [place], [place]  3. Do they need a 30 day or 90 day supply? 90 day supply

## 2021-02-12 NOTE — Telephone Encounter (Signed)
Pt's medication was sent to pt's pharmacy as requested. Confirmation received.  °

## 2021-03-02 DIAGNOSIS — Z8673 Personal history of transient ischemic attack (TIA), and cerebral infarction without residual deficits: Secondary | ICD-10-CM | POA: Diagnosis not present

## 2021-03-02 DIAGNOSIS — G3281 Cerebellar ataxia in diseases classified elsewhere: Secondary | ICD-10-CM | POA: Diagnosis not present

## 2021-03-02 DIAGNOSIS — E7800 Pure hypercholesterolemia, unspecified: Secondary | ICD-10-CM | POA: Diagnosis not present

## 2021-03-02 DIAGNOSIS — I4891 Unspecified atrial fibrillation: Secondary | ICD-10-CM | POA: Diagnosis not present

## 2021-03-02 DIAGNOSIS — I10 Essential (primary) hypertension: Secondary | ICD-10-CM | POA: Diagnosis not present

## 2021-03-02 DIAGNOSIS — W19XXXD Unspecified fall, subsequent encounter: Secondary | ICD-10-CM | POA: Diagnosis not present

## 2021-04-24 DIAGNOSIS — L539 Erythematous condition, unspecified: Secondary | ICD-10-CM | POA: Diagnosis not present

## 2021-04-24 DIAGNOSIS — N390 Urinary tract infection, site not specified: Secondary | ICD-10-CM | POA: Diagnosis not present

## 2021-04-28 DIAGNOSIS — L89302 Pressure ulcer of unspecified buttock, stage 2: Secondary | ICD-10-CM | POA: Diagnosis not present

## 2021-04-28 DIAGNOSIS — N390 Urinary tract infection, site not specified: Secondary | ICD-10-CM | POA: Diagnosis not present

## 2021-04-28 DIAGNOSIS — Z8673 Personal history of transient ischemic attack (TIA), and cerebral infarction without residual deficits: Secondary | ICD-10-CM | POA: Diagnosis not present

## 2021-04-28 DIAGNOSIS — I4891 Unspecified atrial fibrillation: Secondary | ICD-10-CM | POA: Diagnosis not present

## 2021-04-28 DIAGNOSIS — I69193 Ataxia following nontraumatic intracerebral hemorrhage: Secondary | ICD-10-CM | POA: Diagnosis not present

## 2021-04-28 DIAGNOSIS — I10 Essential (primary) hypertension: Secondary | ICD-10-CM | POA: Diagnosis not present

## 2021-05-03 ENCOUNTER — Other Ambulatory Visit: Payer: Self-pay

## 2021-05-03 ENCOUNTER — Inpatient Hospital Stay (HOSPITAL_COMMUNITY)
Admission: EM | Admit: 2021-05-03 | Discharge: 2021-05-15 | DRG: 871 | Disposition: A | Discharge status: Skilled Nursing Facility | Payer: Medicare Other | Source: Skilled Nursing Facility | Attending: Internal Medicine | Admitting: Internal Medicine

## 2021-05-03 ENCOUNTER — Emergency Department (HOSPITAL_COMMUNITY): Payer: Medicare Other

## 2021-05-03 ENCOUNTER — Encounter (HOSPITAL_COMMUNITY): Payer: Self-pay | Admitting: Emergency Medicine

## 2021-05-03 DIAGNOSIS — I4891 Unspecified atrial fibrillation: Secondary | ICD-10-CM | POA: Diagnosis present

## 2021-05-03 DIAGNOSIS — E872 Acidosis: Secondary | ICD-10-CM | POA: Diagnosis not present

## 2021-05-03 DIAGNOSIS — Z66 Do not resuscitate: Secondary | ICD-10-CM | POA: Diagnosis present

## 2021-05-03 DIAGNOSIS — E669 Obesity, unspecified: Secondary | ICD-10-CM

## 2021-05-03 DIAGNOSIS — U071 COVID-19: Secondary | ICD-10-CM | POA: Diagnosis not present

## 2021-05-03 DIAGNOSIS — K8020 Calculus of gallbladder without cholecystitis without obstruction: Secondary | ICD-10-CM | POA: Diagnosis not present

## 2021-05-03 DIAGNOSIS — Z6830 Body mass index (BMI) 30.0-30.9, adult: Secondary | ICD-10-CM

## 2021-05-03 DIAGNOSIS — I472 Ventricular tachycardia: Secondary | ICD-10-CM | POA: Diagnosis present

## 2021-05-03 DIAGNOSIS — E6609 Other obesity due to excess calories: Secondary | ICD-10-CM | POA: Diagnosis not present

## 2021-05-03 DIAGNOSIS — J9601 Acute respiratory failure with hypoxia: Secondary | ICD-10-CM | POA: Diagnosis not present

## 2021-05-03 DIAGNOSIS — I5031 Acute diastolic (congestive) heart failure: Secondary | ICD-10-CM | POA: Diagnosis not present

## 2021-05-03 DIAGNOSIS — R5381 Other malaise: Secondary | ICD-10-CM | POA: Diagnosis not present

## 2021-05-03 DIAGNOSIS — E785 Hyperlipidemia, unspecified: Secondary | ICD-10-CM | POA: Diagnosis present

## 2021-05-03 DIAGNOSIS — L89152 Pressure ulcer of sacral region, stage 2: Secondary | ICD-10-CM | POA: Diagnosis present

## 2021-05-03 DIAGNOSIS — I10 Essential (primary) hypertension: Secondary | ICD-10-CM | POA: Diagnosis not present

## 2021-05-03 DIAGNOSIS — E46 Unspecified protein-calorie malnutrition: Secondary | ICD-10-CM | POA: Diagnosis not present

## 2021-05-03 DIAGNOSIS — E876 Hypokalemia: Secondary | ICD-10-CM | POA: Diagnosis not present

## 2021-05-03 DIAGNOSIS — R918 Other nonspecific abnormal finding of lung field: Secondary | ICD-10-CM | POA: Diagnosis not present

## 2021-05-03 DIAGNOSIS — L8990 Pressure ulcer of unspecified site, unspecified stage: Secondary | ICD-10-CM | POA: Insufficient documentation

## 2021-05-03 DIAGNOSIS — Z823 Family history of stroke: Secondary | ICD-10-CM | POA: Diagnosis not present

## 2021-05-03 DIAGNOSIS — A419 Sepsis, unspecified organism: Principal | ICD-10-CM

## 2021-05-03 DIAGNOSIS — E870 Hyperosmolality and hypernatremia: Secondary | ICD-10-CM | POA: Diagnosis not present

## 2021-05-03 DIAGNOSIS — I480 Paroxysmal atrial fibrillation: Secondary | ICD-10-CM | POA: Diagnosis not present

## 2021-05-03 DIAGNOSIS — Z515 Encounter for palliative care: Secondary | ICD-10-CM | POA: Diagnosis not present

## 2021-05-03 DIAGNOSIS — Z8249 Family history of ischemic heart disease and other diseases of the circulatory system: Secondary | ICD-10-CM

## 2021-05-03 DIAGNOSIS — I110 Hypertensive heart disease with heart failure: Secondary | ICD-10-CM | POA: Diagnosis not present

## 2021-05-03 DIAGNOSIS — I447 Left bundle-branch block, unspecified: Secondary | ICD-10-CM | POA: Diagnosis present

## 2021-05-03 DIAGNOSIS — Z7901 Long term (current) use of anticoagulants: Secondary | ICD-10-CM

## 2021-05-03 DIAGNOSIS — I708 Atherosclerosis of other arteries: Secondary | ICD-10-CM | POA: Diagnosis not present

## 2021-05-03 DIAGNOSIS — Z7189 Other specified counseling: Secondary | ICD-10-CM | POA: Diagnosis not present

## 2021-05-03 DIAGNOSIS — J9621 Acute and chronic respiratory failure with hypoxia: Secondary | ICD-10-CM | POA: Diagnosis not present

## 2021-05-03 DIAGNOSIS — Z800 Family history of malignant neoplasm of digestive organs: Secondary | ICD-10-CM | POA: Diagnosis not present

## 2021-05-03 DIAGNOSIS — K5730 Diverticulosis of large intestine without perforation or abscess without bleeding: Secondary | ICD-10-CM | POA: Diagnosis not present

## 2021-05-03 DIAGNOSIS — G9341 Metabolic encephalopathy: Secondary | ICD-10-CM | POA: Diagnosis not present

## 2021-05-03 DIAGNOSIS — L89892 Pressure ulcer of other site, stage 2: Secondary | ICD-10-CM | POA: Diagnosis not present

## 2021-05-03 DIAGNOSIS — Z743 Need for continuous supervision: Secondary | ICD-10-CM | POA: Diagnosis not present

## 2021-05-03 DIAGNOSIS — R413 Other amnesia: Secondary | ICD-10-CM | POA: Diagnosis present

## 2021-05-03 DIAGNOSIS — E559 Vitamin D deficiency, unspecified: Secondary | ICD-10-CM | POA: Diagnosis not present

## 2021-05-03 DIAGNOSIS — J690 Pneumonitis due to inhalation of food and vomit: Secondary | ICD-10-CM | POA: Diagnosis not present

## 2021-05-03 DIAGNOSIS — M6281 Muscle weakness (generalized): Secondary | ICD-10-CM | POA: Diagnosis not present

## 2021-05-03 DIAGNOSIS — F05 Delirium due to known physiological condition: Secondary | ICD-10-CM | POA: Diagnosis present

## 2021-05-03 DIAGNOSIS — I639 Cerebral infarction, unspecified: Secondary | ICD-10-CM | POA: Diagnosis present

## 2021-05-03 DIAGNOSIS — Z8616 Personal history of COVID-19: Secondary | ICD-10-CM | POA: Diagnosis not present

## 2021-05-03 DIAGNOSIS — F0390 Unspecified dementia without behavioral disturbance: Secondary | ICD-10-CM | POA: Diagnosis present

## 2021-05-03 DIAGNOSIS — R0600 Dyspnea, unspecified: Secondary | ICD-10-CM

## 2021-05-03 DIAGNOSIS — R1312 Dysphagia, oropharyngeal phase: Secondary | ICD-10-CM | POA: Diagnosis not present

## 2021-05-03 DIAGNOSIS — N390 Urinary tract infection, site not specified: Secondary | ICD-10-CM | POA: Diagnosis not present

## 2021-05-03 DIAGNOSIS — N179 Acute kidney failure, unspecified: Secondary | ICD-10-CM | POA: Diagnosis not present

## 2021-05-03 DIAGNOSIS — R279 Unspecified lack of coordination: Secondary | ICD-10-CM | POA: Diagnosis not present

## 2021-05-03 DIAGNOSIS — Z79899 Other long term (current) drug therapy: Secondary | ICD-10-CM

## 2021-05-03 DIAGNOSIS — G4700 Insomnia, unspecified: Secondary | ICD-10-CM | POA: Diagnosis not present

## 2021-05-03 DIAGNOSIS — R6520 Severe sepsis without septic shock: Secondary | ICD-10-CM | POA: Diagnosis present

## 2021-05-03 DIAGNOSIS — Z8673 Personal history of transient ischemic attack (TIA), and cerebral infarction without residual deficits: Secondary | ICD-10-CM | POA: Diagnosis not present

## 2021-05-03 DIAGNOSIS — R404 Transient alteration of awareness: Secondary | ICD-10-CM | POA: Diagnosis not present

## 2021-05-03 DIAGNOSIS — Z87891 Personal history of nicotine dependence: Secondary | ICD-10-CM

## 2021-05-03 LAB — URINALYSIS, ROUTINE W REFLEX MICROSCOPIC
Bilirubin Urine: NEGATIVE
Glucose, UA: NEGATIVE mg/dL
Ketones, ur: NEGATIVE mg/dL
Nitrite: NEGATIVE
Protein, ur: 100 mg/dL — AB
RBC / HPF: >50 RBC/hpf — ABNORMAL HIGH (ref 0–5)
Specific Gravity, Urine: 1.014 (ref 1.005–1.030)
WBC, UA: >50 WBC/hpf — ABNORMAL HIGH (ref 0–5)
pH: 8 (ref 5.0–8.0)

## 2021-05-03 LAB — CBC WITH DIFFERENTIAL/PLATELET
Abs Immature Granulocytes: 0.02 10*3/uL (ref 0.00–0.07)
Basophils Absolute: 0 10*3/uL (ref 0.0–0.1)
Basophils Relative: 0 %
Eosinophils Absolute: 0.1 10*3/uL (ref 0.0–0.5)
Eosinophils Relative: 1 %
HCT: 47.7 % — ABNORMAL HIGH (ref 36.0–46.0)
Hemoglobin: 16.2 g/dL — ABNORMAL HIGH (ref 12.0–15.0)
Immature Granulocytes: 0 %
Lymphocytes Relative: 16 %
Lymphs Abs: 1.3 10*3/uL (ref 0.7–4.0)
MCH: 32.7 pg (ref 26.0–34.0)
MCHC: 34 g/dL (ref 30.0–36.0)
MCV: 96.4 fL (ref 80.0–100.0)
Monocytes Absolute: 0.5 10*3/uL (ref 0.1–1.0)
Monocytes Relative: 7 %
Neutro Abs: 5.9 10*3/uL (ref 1.7–7.7)
Neutrophils Relative %: 76 %
Platelets: 170 10*3/uL (ref 150–400)
RBC: 4.95 MIL/uL (ref 3.87–5.11)
RDW: 13.6 % (ref 11.5–15.5)
WBC: 7.9 10*3/uL (ref 4.0–10.5)
nRBC: 0 % (ref 0.0–0.2)

## 2021-05-03 LAB — COMPREHENSIVE METABOLIC PANEL
ALT: 78 U/L — ABNORMAL HIGH (ref 0–44)
AST: 86 U/L — ABNORMAL HIGH (ref 15–41)
Albumin: 3.5 g/dL (ref 3.5–5.0)
Alkaline Phosphatase: 94 U/L (ref 38–126)
Anion gap: 13 (ref 5–15)
BUN: 42 mg/dL — ABNORMAL HIGH (ref 8–23)
CO2: 31 mmol/L (ref 22–32)
Calcium: 10.3 mg/dL (ref 8.9–10.3)
Chloride: 92 mmol/L — ABNORMAL LOW (ref 98–111)
Creatinine, Ser: 1.09 mg/dL — ABNORMAL HIGH (ref 0.44–1.00)
GFR, Estimated: 51 mL/min — ABNORMAL LOW (ref >60)
Glucose, Bld: 91 mg/dL (ref 70–99)
Potassium: 3.5 mmol/L (ref 3.5–5.1)
Sodium: 136 mmol/L (ref 135–145)
Total Bilirubin: 0.1 mg/dL — ABNORMAL LOW (ref 0.3–1.2)
Total Protein: 7.3 g/dL (ref 6.5–8.1)

## 2021-05-03 LAB — APTT: aPTT: 40 seconds — ABNORMAL HIGH (ref 24–36)

## 2021-05-03 LAB — MRSA PCR SCREENING: MRSA by PCR: NEGATIVE

## 2021-05-03 LAB — GLUCOSE, CAPILLARY
Glucose-Capillary: 110 mg/dL — ABNORMAL HIGH (ref 70–99)
Glucose-Capillary: 113 mg/dL — ABNORMAL HIGH (ref 70–99)

## 2021-05-03 LAB — PROTIME-INR
INR: 1.3 — ABNORMAL HIGH (ref 0.8–1.2)
Prothrombin Time: 16 seconds — ABNORMAL HIGH (ref 11.4–15.2)

## 2021-05-03 LAB — RESP PANEL BY RT-PCR (FLU A&B, COVID) ARPGX2
Influenza A by PCR: NEGATIVE
Influenza B by PCR: NEGATIVE
SARS Coronavirus 2 by RT PCR: POSITIVE — AB

## 2021-05-03 LAB — LACTIC ACID, PLASMA
Lactic Acid, Venous: 3.3 mmol/L (ref 0.5–1.9)
Lactic Acid, Venous: 1.8 mmol/L (ref 0.5–1.9)

## 2021-05-03 MED ORDER — APIXABAN 5 MG PO TABS
5.0000 mg | ORAL_TABLET | Freq: Two times a day (BID) | ORAL | Status: DC
  Administered 2021-05-03 – 2021-05-15 (×19): 5 mg via ORAL
  Filled 2021-05-03 (×20): qty 1

## 2021-05-03 MED ORDER — VALACYCLOVIR HCL 500 MG PO TABS
500.0000 mg | ORAL_TABLET | Freq: Every day | ORAL | Status: DC
  Administered 2021-05-05 – 2021-05-15 (×9): 500 mg via ORAL
  Filled 2021-05-03 (×12): qty 1

## 2021-05-03 MED ORDER — METRONIDAZOLE 500 MG/100ML IV SOLN
500.0000 mg | Freq: Once | INTRAVENOUS | Status: AC
  Administered 2021-05-03: 500 mg via INTRAVENOUS
  Filled 2021-05-03: qty 100

## 2021-05-03 MED ORDER — SODIUM CHLORIDE 0.9 % IV BOLUS
1000.0000 mL | Freq: Once | INTRAVENOUS | Status: AC
  Administered 2021-05-03: 1000 mL via INTRAVENOUS

## 2021-05-03 MED ORDER — VANCOMYCIN HCL IN DEXTROSE 1-5 GM/200ML-% IV SOLN
1000.0000 mg | INTRAVENOUS | Status: DC
  Administered 2021-05-03: 1000 mg via INTRAVENOUS
  Filled 2021-05-03: qty 200

## 2021-05-03 MED ORDER — LACTATED RINGERS IV BOLUS
1000.0000 mL | Freq: Once | INTRAVENOUS | Status: AC
  Administered 2021-05-03: 1000 mL via INTRAVENOUS

## 2021-05-03 MED ORDER — SODIUM CHLORIDE 0.9 % IV SOLN
2.0000 g | Freq: Once | INTRAVENOUS | Status: AC
  Administered 2021-05-03: 2 g via INTRAVENOUS
  Filled 2021-05-03: qty 2

## 2021-05-03 MED ORDER — METRONIDAZOLE 500 MG/100ML IV SOLN
500.0000 mg | Freq: Three times a day (TID) | INTRAVENOUS | Status: DC
  Administered 2021-05-03 – 2021-05-04 (×2): 500 mg via INTRAVENOUS
  Filled 2021-05-03 (×2): qty 100

## 2021-05-03 MED ORDER — LACTATED RINGERS IV BOLUS
250.0000 mL | Freq: Once | INTRAVENOUS | Status: AC
  Administered 2021-05-03: 250 mL via INTRAVENOUS

## 2021-05-03 MED ORDER — SODIUM CHLORIDE 0.9 % IV SOLN: INTRAVENOUS | Status: DC

## 2021-05-03 MED ORDER — CHLORHEXIDINE GLUCONATE CLOTH 2 % EX PADS
6.0000 | MEDICATED_PAD | Freq: Every day | CUTANEOUS | Status: DC
  Administered 2021-05-04 – 2021-05-10 (×7): 6 via TOPICAL

## 2021-05-03 MED ORDER — VANCOMYCIN HCL IN DEXTROSE 1-5 GM/200ML-% IV SOLN
1000.0000 mg | Freq: Once | INTRAVENOUS | Status: AC
  Administered 2021-05-03: 1000 mg via INTRAVENOUS
  Filled 2021-05-03: qty 200

## 2021-05-03 MED ORDER — CARBOXYMETHYLCELLULOSE SODIUM 1 % OP SOLN: 2.0000 [drp] | Freq: Every day | OPHTHALMIC | Status: DC

## 2021-05-03 MED ORDER — POLYVINYL ALCOHOL 1.4 % OP SOLN: 1.0000 [drp] | OPHTHALMIC | Status: DC | PRN

## 2021-05-03 MED ORDER — CLOTRIMAZOLE 1 % EX CREA
1.0000 "application " | TOPICAL_CREAM | Freq: Two times a day (BID) | CUTANEOUS | Status: DC
  Administered 2021-05-04 – 2021-05-15 (×22): 1 via TOPICAL
  Filled 2021-05-03 (×2): qty 15

## 2021-05-03 MED ORDER — ACETAMINOPHEN 650 MG RE SUPP
650.0000 mg | Freq: Four times a day (QID) | RECTAL | Status: DC | PRN
  Filled 2021-05-03: qty 1

## 2021-05-03 MED ORDER — ONDANSETRON HCL 4 MG PO TABS: 4.0000 mg | ORAL_TABLET | Freq: Four times a day (QID) | ORAL | Status: DC | PRN

## 2021-05-03 MED ORDER — MEMANTINE HCL 10 MG PO TABS
10.0000 mg | ORAL_TABLET | Freq: Two times a day (BID) | ORAL | Status: DC
  Administered 2021-05-03 – 2021-05-15 (×19): 10 mg via ORAL
  Filled 2021-05-03: qty 1
  Filled 2021-05-03 (×2): qty 2
  Filled 2021-05-03 (×6): qty 1
  Filled 2021-05-03: qty 2
  Filled 2021-05-03: qty 1
  Filled 2021-05-03 (×5): qty 2
  Filled 2021-05-03 (×2): qty 1
  Filled 2021-05-03: qty 2
  Filled 2021-05-03: qty 1

## 2021-05-03 MED ORDER — SODIUM CHLORIDE 0.9 % IV SOLN
2.0000 g | Freq: Two times a day (BID) | INTRAVENOUS | Status: DC
  Administered 2021-05-03 – 2021-05-07 (×8): 2 g via INTRAVENOUS
  Filled 2021-05-03 (×8): qty 2

## 2021-05-03 MED ORDER — ORAL CARE MOUTH RINSE
15.0000 mL | Freq: Two times a day (BID) | OROMUCOSAL | Status: DC
  Administered 2021-05-03 – 2021-05-15 (×23): 15 mL via OROMUCOSAL

## 2021-05-03 MED ORDER — CHLORHEXIDINE GLUCONATE CLOTH 2 % EX PADS
6.0000 | MEDICATED_PAD | Freq: Every day | CUTANEOUS | Status: DC
  Administered 2021-05-03: 6 via TOPICAL

## 2021-05-03 MED ORDER — ONDANSETRON HCL 4 MG/2ML IJ SOLN: 4.0000 mg | Freq: Four times a day (QID) | INTRAMUSCULAR | Status: DC | PRN

## 2021-05-03 MED ORDER — ACETAMINOPHEN 325 MG PO TABS
650.0000 mg | ORAL_TABLET | Freq: Four times a day (QID) | ORAL | Status: DC | PRN
  Administered 2021-05-10 (×2): 650 mg via ORAL
  Filled 2021-05-03 (×2): qty 2

## 2021-05-03 MED ORDER — ROSUVASTATIN CALCIUM 10 MG PO TABS
10.0000 mg | ORAL_TABLET | Freq: Every day | ORAL | Status: DC
  Administered 2021-05-05 – 2021-05-14 (×8): 10 mg via ORAL
  Filled 2021-05-03 (×8): qty 1

## 2021-05-03 NOTE — Sepsis Progress Note (Signed)
Repeat LA drawn at 1352 is 1.8.

## 2021-05-03 NOTE — ED Notes (Signed)
Pt husband at bedside

## 2021-05-03 NOTE — ED Triage Notes (Signed)
BIBA Per EMS: Pt coming from [place]. Staff called out because they found pt sitting on floor. They want pt checked out due to "acting unusual" by taking clothes off more frequently and not being able to sit still  Pt does take blood Thinner  Pt hx of dementia A&O to self only at baseline  Vitals WDL

## 2021-05-03 NOTE — ED Notes (Signed)
Pt placed on bair hugger at highest setting.

## 2021-05-03 NOTE — Plan of Care (Signed)
Discussed in front of patient plan of care for the evening, pain management and bear hugger with no evidence of learning at this time.   Problem: Education: Goal: Knowledge of General Education information will improve Description: Including pain rating scale, medication(s)/side effects and non-pharmacologic comfort measures Outcome: Not Progressing

## 2021-05-03 NOTE — Sepsis Progress Note (Signed)
Sepsis protocol is being followed by [facility]. 

## 2021-05-03 NOTE — ED Notes (Signed)
Patient lactic acid 3.3. result given to Abby, RN

## 2021-05-03 NOTE — ED Notes (Signed)
Order in for non-violent wrist restraints in. Restraints applied for patient safety and care. Pt husband educated on need for restraints.

## 2021-05-03 NOTE — ED Notes (Signed)
Bolus given to patient by fluid warmer.

## 2021-05-03 NOTE — ED Provider Notes (Addendum)
[facility] DEPT Provider Note   CSN: [id number] Arrival date & time: 05/03/21  1050     History No chief complaint on file.   Jennifer Arroyo is a 80 y.o. female.  Patient is a 80 year old female who presents after a fall.  She has a history of dementia, hypertension, hyperlipidemia, atrial fibrillation on Eliquis.  She lives at [facility].  Per the nursing staff there, she has had some increased confusion over the last couple of weeks.  It was felt that this may have been contributed to by a UTI.  However they were unable to collect a urine sample due to her incontinence.  Therefore she was empirically treated with Macrobid which she finished yesterday.  Today she had some increased agitation.  She was continuing to try to get out of bed.  She reportedly had a fall this morning although the nursing facility currently does not know the circumstances of the fall and whether it was witnessed or unwitnessed.  She has not complained of any specific areas of pain.  History is limited due to her dementia.        Past Medical History:  Diagnosis Date  . Bilateral bunions    hammer toe  . Cervical os stenosis   . CVA (cerebral vascular accident) ([place])   . Gallstones   . Hemangioma of liver    probable  . Hemorrhoids   . Hyperlipemia   . Hypertension   . Insomnia   . Left bundle branch block   . Left bundle branch block   . Low back pain   . Memory loss   . Plantar fasciitis   . Rectocele   . Renal cyst   . Stroke ([place]) 01/2018    Patient Active Problem List   Diagnosis Date Noted  . CVA (cerebral vascular accident) ([place]) 09/19/2020  . New onset a-fib ([place]) 09/19/2020  . HTN (hypertension) 09/19/2020  . Hyperlipidemia 09/19/2020  . Dementia without behavioral disturbance ([place]) 09/19/2020  . Fall at home, initial encounter 09/19/2020  . Bacteria in urine 09/19/2020  . Atrial fibrillation with RVR ([place])   . Memory loss 01/11/2019  .  Gait abnormality 01/11/2019    Past Surgical History:  Procedure Laterality Date  . APPENDECTOMY    . COLONOSCOPY    . TONSILLECTOMY       OB History   No obstetric history on file.     Family History  Problem Relation Age of Onset  . Stroke Mother   . Cancer Father   . Healthy Brother   . Liver cancer Father   . Hypertension Brother   . Hyperlipidemia Brother   . Rheumatic fever Mother   . Heart disease Mother     Social History   Tobacco Use  . Smoking status: Former Smoker    Packs/day: 0.50    Types: Cigarettes    Quit date: 12/08/1978    Years since quitting: 42.4  . Smokeless tobacco: Never Used  . Tobacco comment: quit at age 53, 1 PPD x 10 year history  Vaping Use  . Vaping Use: Never used  Substance Use Topics  . Alcohol use: Never  . Drug use: Yes    Comment: back in the 1980s. hasnt smoked since then    Home Medications Prior to Admission medications   Medication Sig Start Date End Date Taking? Authorizing Provider  apixaban (ELIQUIS) 5 MG TABS tablet Take 1 tablet (5 mg total) by mouth 2 (two)  times daily. 11/17/20  Yes Freada Bergeron, MD  Biotin 2500 MCG CAPS Take 2,500 mg by mouth daily.   Yes [provider]  Calcium Carb-Cholecalciferol (CALCIUM-VITAMIN D3) 600-200 MG-UNIT TABS Take 1 tablet by mouth daily.   Yes [provider]  carboxymethylcellulose 1 % ophthalmic solution Place 2 drops into both eyes daily.   Yes [provider]  Cholecalciferol (VITAMIN D) 50 MCG (2000 UT) tablet Take 2,000 Units by mouth daily.   Yes [provider]  Coenzyme Q10 (CO Q-10) 100 MG CAPS Take 100 mg by mouth daily.    Yes [provider]  Cyanocobalamin 1000 MCG TBCR Take 1,000 mcg by mouth daily.   Yes [provider]  docusate sodium (COLACE) 100 MG capsule Take 100 mg by mouth See admin instructions. 100mg  on Monday and Friday   Yes [provider]  hydrochlorothiazide (HYDRODIURIL) 25 MG  tablet Take 25 mg by mouth daily. 01/28/21  Yes [provider]  Magnesium 250 MG TABS Take 750 mg by mouth daily.   Yes [provider]  melatonin 5 MG TABS Take 5 mg by mouth at bedtime.   Yes [provider]  metoprolol succinate (TOPROL-XL) 50 MG 24 hr tablet Take 1 tablet (50 mg total) by mouth daily. 02/12/21  Yes Freada Bergeron, MD  Multiple Vitamin (MULTIVITAMIN WITH MINERALS) TABS tablet Take 1 tablet by mouth daily.   Yes [provider]  Omega 3 1200 MG CAPS Take 1,200 mg by mouth daily.   Yes [provider]  rosuvastatin (CRESTOR) 10 MG tablet Take 10 mg by mouth daily.   Yes [provider]  valACYclovir (VALTREX) 500 MG tablet Take 500 mg by mouth daily.  07/28/20  Yes [provider]  vitamin B-12 (CYANOCOBALAMIN) 1000 MCG tablet Take 1,000 mcg by mouth daily.   Yes [provider]  BIOTIN PO Take 1 tablet by mouth daily.    [provider]  Calcium Carb-Cholecalciferol (CALCIUM 600-D PO) Take 1 tablet by mouth daily.    [provider]  Cyanocobalamin (VITAMIN B-12 PO) Take 1 tablet by mouth daily.    [provider]  ELIQUIS 5 MG TABS tablet Take 5 mg by mouth 2 (two) times daily. 11/18/20   [provider]  hydrochlorothiazide (HYDRODIURIL) 25 MG tablet Take 25 mg by mouth daily.    [provider]  memantine (NAMENDA) 10 MG tablet Take 1 tablet (10 mg total) by mouth 2 (two) times daily. Please call [phone] to schedule appt. 01/06/21   Marcial Pacas, MD  memantine (NAMENDA) 10 MG tablet Take 10 mg by mouth 2 (two) times daily. 12/26/20   [provider]  Omega-3 Fatty Acids (OMEGA 3 PO) Take 1,200 mg by mouth daily.    [provider]  potassium chloride SA (KLOR-CON) 20 MEQ tablet Take 1 tablet (20 mEq total) by mouth daily. 02/05/21   Daleen Bo, MD    Allergies    Amoxicillin, Ceclor [cefaclor], Levaquin [levofloxacin], and  Nystatin  Review of Systems   Review of Systems  Unable to perform ROS: Dementia    Physical Exam Updated Vital Signs BP (!) 111/59   Pulse 73   Temp (!) 93.6 F (34.2 C) (Rectal)   Resp 16   SpO2 97%   Physical Exam Constitutional:      Appearance: She is well-developed.  HENT:     Head: Normocephalic and atraumatic.     Nose: Nose normal.  Mouth/Throat:     Mouth: Mucous membranes are moist.     Pharynx: Oropharynx is clear.  Eyes:     Conjunctiva/sclera: Conjunctivae normal.     Pupils: Pupils are equal, round, and reactive to light.  Neck:     Comments: No pain along the spine Cardiovascular:     Rate and Rhythm: Normal rate and regular rhythm.     Heart sounds: Normal heart sounds.  Pulmonary:     Effort: Pulmonary effort is normal. No respiratory distress.     Breath sounds: Normal breath sounds. No wheezing or rales.     Comments: No rib tenderness Chest:     Chest wall: No tenderness.  Abdominal:     General: Bowel sounds are normal.     Palpations: Abdomen is soft.     Tenderness: There is no abdominal tenderness. There is no guarding or rebound.  Musculoskeletal:        General: Normal range of motion.     Cervical back: Normal range of motion and neck supple.     Comments: No pain on palpation or range of motion of the extremities  Lymphadenopathy:     Cervical: No cervical adenopathy.  Skin:    General: Skin is warm and dry.     Findings: No rash.     Comments: Erythema in her inguinal folds consistent with candidal infection  Neurological:     General: No focal deficit present.     Mental Status: She is alert.     Comments: Patient is awake and alert.  She will answer some simple questions but cannot tell me her name, where she is or what year it is.  She is moving all extremities symmetrically without focal deficits     ED Results / Procedures / Treatments   Labs (all labs ordered are listed, but only abnormal results are displayed) Labs  Reviewed  COMPREHENSIVE METABOLIC PANEL - Abnormal; Notable for the following components:      Result Value   Chloride 92 (*)    BUN 42 (*)    Creatinine, Ser 1.09 (*)    AST 86 (*)    ALT 78 (*)    Total Bilirubin 0.1 (*)    GFR, Estimated 51 (*)    All other components within normal limits  CBC WITH DIFFERENTIAL/PLATELET - Abnormal; Notable for the following components:   Hemoglobin 16.2 (*)    HCT 47.7 (*)    All other components within normal limits  URINALYSIS, ROUTINE W REFLEX MICROSCOPIC - Abnormal; Notable for the following components:   APPearance CLOUDY (*)    Hgb urine dipstick MODERATE (*)    Protein, ur 100 (*)    Leukocytes,Ua LARGE (*)    RBC / HPF >50 (*)    WBC, UA >50 (*)    Bacteria, UA RARE (*)    Non Squamous Epithelial 0-5 (*)    All other components within normal limits  LACTIC ACID, PLASMA - Abnormal; Notable for the following components:   Lactic Acid, Venous 3.3 (*)    All other components within normal limits  PROTIME-INR - Abnormal; Notable for the following components:   Prothrombin Time 16.0 (*)    INR 1.3 (*)    All other components within normal limits  APTT - Abnormal; Notable for the following components:   aPTT 40 (*)    All other components within normal limits  CULTURE, BLOOD (ROUTINE X 2)  CULTURE, BLOOD (ROUTINE X 2)  URINE  CULTURE  RESP PANEL BY RT-PCR (FLU A&B, COVID) ARPGX2  LACTIC ACID, PLASMA    EKG EKG Interpretation  Date/Time:  Sunday May 03 2021 11:45:56 EDT Ventricular Rate:  64 PR Interval:    QRS Duration: 98 QT Interval:  477 QTC Calculation: 493 R Axis:   67 Text Interpretation: Atrial fibrillation Inferior infarct, old Confirmed by Malvin Johns [phone]) on 05/03/2021 12:31:28 PM   Radiology CT Abdomen Pelvis Wo Contrast  Result Date: 05/03/2021 CLINICAL DATA:  80 year old female with history of abdominal pain. EXAM: CT ABDOMEN AND PELVIS WITHOUT CONTRAST TECHNIQUE: Multidetector CT imaging of the abdomen  and pelvis was performed following the standard protocol without IV contrast. COMPARISON:  No priors. FINDINGS: Lower chest: Atherosclerotic calcifications in the thoracic aorta as well as the left main and left anterior descending coronary arteries. Hepatobiliary: Low-attenuation lesions in the central aspect of the liver measuring up to 1.4 cm in diameter, incompletely characterized on today's non-contrast CT examination, but statistically likely to represent cysts. No other larger more suspicious appearing hepatic lesions noted on today's noncontrast CT examination. Several calcified gallstones are noted in the gallbladder measuring up to 1.7 cm in diameter. Pancreas: No definite pancreatic mass or peripancreatic fluid collections or inflammatory changes noted on today's noncontrast CT examination. Spleen: Unremarkable. Adrenals/Urinary Tract: Low-attenuation lesions in both kidneys, incompletely characterized on today's non-contrast CT examination, but statistically likely to represent cysts, largest of which is in the lower pole of the left kidney measuring 8.8 x 5.7 cm. No hydroureteronephrosis. Urinary bladder is normal in appearance. Bilateral adrenal glands are normal in appearance. Stomach/Bowel: Unenhanced appearance of the stomach is normal. No pathologic dilatation of small bowel or colon. Numerous colonic diverticulae are noted, without definite surrounding inflammatory changes to suggest an acute diverticulitis at this time. The appendix is not confidently identified and may be surgically absent. Regardless, there are no inflammatory changes noted adjacent to the cecum to suggest the presence of an acute appendicitis at this time. Vascular/Lymphatic: Aortic atherosclerosis. No lymphadenopathy noted in the abdomen or pelvis. Reproductive: Uterus is enlarged and heterogeneous in appearance with multiple partially calcified lesions, largest of which is in the posterior aspect of the uterine body  measuring 8.5 cm in diameter. Unenhanced appearance of the ovaries is unremarkable. Other: No significant volume of ascites.  No pneumoperitoneum. Musculoskeletal: There are no aggressive appearing lytic or blastic lesions noted in the visualized portions of the skeleton. IMPRESSION: 1. No acute findings are noted in the abdomen or pelvis to account for the patient's symptoms. 2. Colonic diverticulosis without evidence of acute diverticulitis at this time. 3. Fibroid uterus. 4. Aortic atherosclerosis, as well as left main and left anterior descending coronary artery disease. 5. Additional incidental findings, as above. Electronically Signed   By: Vinnie Langton M.D.   On: 05/03/2021 13:40   CT Head Wo Contrast  Result Date: 05/03/2021 CLINICAL DATA:  AMS EXAM: CT HEAD WITHOUT CONTRAST CT CERVICAL SPINE WITHOUT CONTRAST TECHNIQUE: Multidetector CT imaging of the head and cervical spine was performed following the standard protocol without intravenous contrast. Multiplanar CT image reconstructions of the cervical spine were also generated. COMPARISON:  09/20/20, 02/05/21. FINDINGS: CT HEAD FINDINGS Brain: No evidence of acute infarction, hemorrhage, hydrocephalus, extra-axial collection or mass lesion/mass effect. Remote RIGHT parietal infarction. Periventricular white matter hypodensities consistent with sequela of chronic microvascular ischemic disease. Global parenchymal volume loss. Revisualization of multiple bilateral lacunar infarctions of the basal ganglia. Vascular: Vascular calcifications. Skull: No acute fracture. Sinuses/Orbits: No acute finding.  Other: None. CT CERVICAL SPINE FINDINGS Alignment: Unchanged minimal C3-4 anterolisthesis. Straightening of the cervical lordosis. No new spondylolisthesis. Skull base and vertebrae: No acute fracture. Soft tissues and spinal canal: No prevertebral fluid or swelling. No visible canal hematoma. Disc levels: Multilevel severe intervertebral disc space height  loss at C4-5, C5-6, C6-7 and C7-T1. Multilevel facet arthropathy and uncovertebral hypertrophy. Osseous ankylosis of the RIGHT facets of C2-3. Posterior disc osteophyte complex results in severe canal narrowing at C5-6 and C6-7, unchanged in comparison to prior. Multilevel osseous neuroforaminal narrowing, most pronounced at LEFT C5-6 and C6-7. Upper chest: Unchanged LEFT greater than RIGHT apical scarring. Other: Unchanged appearance of multiple thyroid nodules. IMPRESSION: 1.  No acute intracranial abnormality. 2.  No acute fracture or static subluxation of the cervical spine. Electronically Signed   By: Valentino Saxon MD   On: 05/03/2021 11:59   CT Cervical Spine Wo Contrast  Result Date: 05/03/2021 CLINICAL DATA:  AMS EXAM: CT HEAD WITHOUT CONTRAST CT CERVICAL SPINE WITHOUT CONTRAST TECHNIQUE: Multidetector CT imaging of the head and cervical spine was performed following the standard protocol without intravenous contrast. Multiplanar CT image reconstructions of the cervical spine were also generated. COMPARISON:  09/20/20, 02/05/21. FINDINGS: CT HEAD FINDINGS Brain: No evidence of acute infarction, hemorrhage, hydrocephalus, extra-axial collection or mass lesion/mass effect. Remote RIGHT parietal infarction. Periventricular white matter hypodensities consistent with sequela of chronic microvascular ischemic disease. Global parenchymal volume loss. Revisualization of multiple bilateral lacunar infarctions of the basal ganglia. Vascular: Vascular calcifications. Skull: No acute fracture. Sinuses/Orbits: No acute finding. Other: None. CT CERVICAL SPINE FINDINGS Alignment: Unchanged minimal C3-4 anterolisthesis. Straightening of the cervical lordosis. No new spondylolisthesis. Skull base and vertebrae: No acute fracture. Soft tissues and spinal canal: No prevertebral fluid or swelling. No visible canal hematoma. Disc levels: Multilevel severe intervertebral disc space height loss at C4-5, C5-6, C6-7 and  C7-T1. Multilevel facet arthropathy and uncovertebral hypertrophy. Osseous ankylosis of the RIGHT facets of C2-3. Posterior disc osteophyte complex results in severe canal narrowing at C5-6 and C6-7, unchanged in comparison to prior. Multilevel osseous neuroforaminal narrowing, most pronounced at LEFT C5-6 and C6-7. Upper chest: Unchanged LEFT greater than RIGHT apical scarring. Other: Unchanged appearance of multiple thyroid nodules. IMPRESSION: 1.  No acute intracranial abnormality. 2.  No acute fracture or static subluxation of the cervical spine. Electronically Signed   By: Valentino Saxon MD   On: 05/03/2021 11:59   DG Chest Port 1 View  Result Date: 05/03/2021 CLINICAL DATA:  Sepsis EXAM: PORTABLE CHEST 1 VIEW COMPARISON:  September 19, 2020 FINDINGS: The cardiomediastinal silhouette is unchanged in contour.Unchanged elevation of the RIGHT hemidiaphragm. Atherosclerotic calcifications. No pleural effusion. No pneumothorax. No acute pleuroparenchymal abnormality. Visualized abdomen is unremarkable. Multilevel degenerative changes of the thoracic spine. IMPRESSION: No acute cardiopulmonary abnormality. Electronically Signed   By: Valentino Saxon MD   On: 05/03/2021 11:43    Procedures Procedures   Medications Ordered in ED Medications  metroNIDAZOLE (FLAGYL) IVPB 500 mg (500 mg Intravenous New Bag/Given 05/03/21 1403)  vancomycin (VANCOCIN) IVPB 1000 mg/200 mL premix (1,000 mg Intravenous New Bag/Given 05/03/21 1402)  sodium chloride 0.9 % bolus 1,000 mL (0 mLs Intravenous Stopped 05/03/21 1351)  aztreonam (AZACTAM) 2 g in sodium chloride 0.9 % 100 mL IVPB (2 g Intravenous New Bag/Given 05/03/21 1400)  lactated ringers bolus 1,000 mL (1,000 mLs Intravenous New Bag/Given 05/03/21 1351)    ED Course  I have reviewed the triage vital signs and the nursing notes.  Pertinent labs &  imaging results that were available during my care of the patient were reviewed by me and considered in my medical  decision making (see chart for details).    MDM Rules/Calculators/A&P                          Patient is a 80 year old female who presents after a fall.  The circumstances of the fall are unknown but she has had some increased confusion and agitation recently.  She is noted to be hypothermic on arrival and was placed on a Bair hugger and warm IV fluids were initiated.  Sepsis protocol was initiated.  She had an elevated lactate but a normal white count.  However given her symptoms that are concerning for sepsis, was started on IV antibiotics and IV fluids.  Her urine appears to be consistent with infection.  Her other labs are nonconcerning other than some mild elevation in her LFTs.  CT scan of her abdomen pelvis showed no acute abnormalities.  She had a CT scan of her head and cervical spine due to her fall.  These showed no acute abnormalities.  I spoke with Dr. Marylyn Ishihara who will admit the patient for further treatment.  COVID test is pending.  CRITICAL CARE Performed by: Malvin Johns Total critical care time: 75 minutes Critical care time was exclusive of separately billable procedures and treating other patients. Critical care was necessary to treat or prevent imminent or life-threatening deterioration. Critical care was time spent personally by me on the following activities: development of treatment plan with patient and/or surrogate as well as nursing, discussions with consultants, evaluation of patient's response to treatment, examination of patient, obtaining history from patient or surrogate, ordering and performing treatments and interventions, ordering and review of laboratory studies, ordering and review of radiographic studies, pulse oximetry and re-evaluation of patient's condition.  Final Clinical Impression(s) / ED Diagnoses Final diagnoses:  Sepsis, due to unspecified organism, unspecified whether acute organ dysfunction present [facility])  Urinary tract infection without hematuria,  site unspecified    Rx / DC Orders ED Discharge Orders    None       Malvin Johns, MD 05/03/21 1433    Malvin Johns, MD 05/03/21 1501

## 2021-05-03 NOTE — Progress Notes (Signed)
A consult was received from an ED physician for vanc/aztreonam per pharmacy dosing.  The patient's profile has been reviewed for ht/wt/allergies/indication/available labs.   A one time order has been placed for aztreonam 2g and vanc 1g.  Further antibiotics/pharmacy consults should be ordered by admitting physician if indicated.                       Thank you, Kara Mead 05/03/2021  1:50 PM

## 2021-05-03 NOTE — Progress Notes (Signed)
Pharmacy Antibiotic Note  Jennifer Arroyo is a 80 y.o. female admitted on 05/03/2021 with sepsis.  Pharmacy has been consulted for vancomycin dosing.  Plan:  Vancomycin 1000 mg IV q24 hr (est AUC 493 based on SCr 1.09; Vd 0.72)  Measure vancomycin AUC at steady state as indicated  SCr q48 while on vanc  Cefepime 2 g IV q12 hr and Flagyl 500 mg IV q8 hr per MD; dosing appropriate       Temp (24hrs), Avg:93.7 F (34.3 C), Min:93.5 F (34.2 C), Max:94.1 F (34.5 C)  Recent Labs  Lab 05/03/21 1159  WBC 7.9  CREATININE 1.09*  LATICACIDVEN 1.8  3.3*    CrCl cannot be calculated (Unknown ideal weight.).    Allergies  Allergen Reactions  . Amoxicillin Nausea Only  . Ceclor [Cefaclor] Hives  . Levaquin [Levofloxacin] Other (See Comments)  . Nystatin Other (See Comments)    Black scale around teeth.    Antimicrobials this admission: 5/29 vancomycin >>  5/29 cefepime >>  5/29 Flagyl >>  Dose adjustments this admission: n/a  Microbiology results: 5/29 BCx: sent 5/29 UCx: sent   Thank you for allowing pharmacy to be a part of this patient's care.  Jenny Lai A 05/03/2021 4:05 PM

## 2021-05-03 NOTE — H&P (Signed)
History and Physical    Jennifer Arroyo OBS:[id number] DOB: Aug 25, 1941 DOA: 05/03/2021  PCP: Pcp, No  Patient coming from: [facility]  Chief Complaint: fall, confusion  HPI: Jennifer Arroyo is a 80 y.o. female with medical history significant of dementia, HLD, HTN, a fib, CVA. Presenting after being found down at her facility. History per chart review. She apparently had an unwitnessed fall. No apparent head injury. Facility reports that she had been increasingly agitated recently. They believed it was from a UTI. So, they empirically treated w/ 7 days of macrobid. No cultures sent. Her agitation has not improved. After finding her down this morning, they were concerned. They sent her to the ED for evaluation.     ED Course: She was noted to have an elevated lactic acid. She was hypothermic and tachypneic. She was hypotensive. She was started on sepsis protocol. TRH was called for admission.   Review of Systems:  Unable to obtain d/t mentation.  PMHx Past Medical History:  Diagnosis Date  . Bilateral bunions    hammer toe  . Cervical os stenosis   . CVA (cerebral vascular accident) ([place])   . Gallstones   . Hemangioma of liver    probable  . Hemorrhoids   . Hyperlipemia   . Hypertension   . Insomnia   . Left bundle branch block   . Left bundle branch block   . Low back pain   . Memory loss   . Plantar fasciitis   . Rectocele   . Renal cyst   . Stroke ([place]) 01/2018    PSHx Past Surgical History:  Procedure Laterality Date  . APPENDECTOMY    . COLONOSCOPY    . TONSILLECTOMY      [facility]  reports that she quit smoking about 42 years ago. Her smoking use included cigarettes. She smoked 0.50 packs per day. She has never used smokeless tobacco. She reports current drug use. She reports that she does not drink alcohol.  Allergies  Allergen Reactions  . Amoxicillin Nausea Only  . Ceclor [Cefaclor] Hives  . Levaquin [Levofloxacin] Other (See Comments)  . Nystatin  Other (See Comments)    Black scale around teeth.    FamHx Family History  Problem Relation Age of Onset  . Stroke Mother   . Cancer Father   . Healthy Brother   . Liver cancer Father   . Hypertension Brother   . Hyperlipidemia Brother   . Rheumatic fever Mother   . Heart disease Mother     Prior to Admission medications   Medication Sig Start Date End Date Taking? Authorizing Provider  apixaban (ELIQUIS) 5 MG TABS tablet Take 1 tablet (5 mg total) by mouth 2 (two) times daily. 11/17/20  Yes Freada Bergeron, MD  Biotin 2500 MCG CAPS Take 2,500 mg by mouth daily.   Yes [provider]  Calcium Carb-Cholecalciferol (CALCIUM-VITAMIN D3) 600-200 MG-UNIT TABS Take 1 tablet by mouth daily.   Yes [provider]  carboxymethylcellulose 1 % ophthalmic solution Place 2 drops into both eyes daily.   Yes [provider]  Cholecalciferol (VITAMIN D) 50 MCG (2000 UT) tablet Take 2,000 Units by mouth daily.   Yes [provider]  clotrimazole (LOTRIMIN) 1 % cream Apply 1 application topically 2 (two) times daily. Apply to abdominal folds topically two times daily for yeast   Yes [provider]  Coenzyme Q10 (CO Q-10) 100 MG CAPS Take 100 mg by mouth daily.  Yes [provider]  Cyanocobalamin 1000 MCG TBCR Take 1,000 mcg by mouth daily.   Yes [provider]  docusate sodium (COLACE) 100 MG capsule Take 100 mg by mouth See admin instructions. 100mg  on Monday and Friday   Yes [provider]  hydrochlorothiazide (HYDRODIURIL) 25 MG tablet Take 25 mg by mouth daily. 01/28/21  Yes [provider]  loperamide (IMODIUM A-D) 2 MG tablet Take 2 mg by mouth every 6 (six) hours as needed for diarrhea or loose stools.   Yes [provider]  Magnesium 250 MG TABS Take 750 mg by mouth daily.   Yes [provider]  melatonin 5 MG TABS Take 5 mg by mouth at bedtime.   Yes [provider]  memantine  (NAMENDA) 10 MG tablet Take 1 tablet (10 mg total) by mouth 2 (two) times daily. Please call [phone] to schedule appt. 01/06/21  Yes Marcial Pacas, MD  metoprolol succinate (TOPROL-XL) 50 MG 24 hr tablet Take 1 tablet (50 mg total) by mouth daily. 02/12/21  Yes Freada Bergeron, MD  Multiple Vitamin (MULTIVITAMIN WITH MINERALS) TABS tablet Take 1 tablet by mouth daily.   Yes [provider]  Omega 3 1200 MG CAPS Take 1,200 mg by mouth daily.   Yes [provider]  rosuvastatin (CRESTOR) 10 MG tablet Take 10 mg by mouth daily.   Yes [provider]  valACYclovir (VALTREX) 500 MG tablet Take 500 mg by mouth daily.  07/28/20  Yes [provider]  vitamin B-12 (CYANOCOBALAMIN) 1000 MCG tablet Take 1,000 mcg by mouth daily.   Yes [provider]  nitrofurantoin, macrocrystal-monohydrate, (MACROBID) 100 MG capsule Take 100 mg by mouth 2 (two) times daily. 02/06/21   [provider]  potassium chloride SA (KLOR-CON) 20 MEQ tablet Take 1 tablet (20 mEq total) by mouth daily. Patient not taking: Reported on 05/03/2021 02/05/21   Daleen Bo, MD    Physical Exam: Vitals:   05/03/21 1400 05/03/21 1415 05/03/21 1440 05/03/21 1500  BP: (!) 98/55 (!) 111/59 94/60   Pulse: 70 73 74   Resp: 16  (!) 21   Temp:    (!) 94.1 F (34.5 C)  TempSrc:    Rectal  SpO2: 99% 97% 94%     General: 80 y.o. female resting in bed in NAD Eyes: PERRL, normal sclera ENMT: Nares patent w/o discharge, orophaynx clear, dentition normal, ears w/o discharge/lesions/ulcers Neck: Supple, trachea midline Cardiovascular: RRR, +S1, S2, no m/g/r, equal pulses throughout Respiratory: CTABL, no w/r/r, normal WOB GI: BS+, NDNT, no masses noted, no organomegaly noted MSK: No e/c/c Neuro: she does follow some directions, but does not talk with me; however, unable to complete full neuro exam d/t overall confusion Psyc: calm  Labs on Admission: I have personally reviewed following labs  and imaging studies  CBC: Recent Labs  Lab 05/03/21 1159  WBC 7.9  NEUTROABS 5.9  HGB 16.2*  HCT 47.7*  MCV 96.4  PLT 700   Basic Metabolic Panel: Recent Labs  Lab 05/03/21 1159  NA 136  K 3.5  CL 92*  CO2 31  GLUCOSE 91  BUN 42*  CREATININE 1.09*  CALCIUM 10.3   GFR: CrCl cannot be calculated (Unknown ideal weight.). Liver Function Tests: Recent Labs  Lab 05/03/21 1159  AST 86*  ALT 78*  ALKPHOS 94  BILITOT 0.1*  PROT 7.3  ALBUMIN 3.5   No results for input(s): LIPASE, AMYLASE in the last 168 hours. No results for  input(s): AMMONIA in the last 168 hours. Coagulation Profile: Recent Labs  Lab 05/03/21 1159  INR 1.3*   Cardiac Enzymes: No results for input(s): CKTOTAL, CKMB, CKMBINDEX, TROPONINI in the last 168 hours. BNP (last 3 results) No results for input(s): PROBNP in the last 8760 hours. HbA1C: No results for input(s): HGBA1C in the last 72 hours. CBG: No results for input(s): GLUCAP in the last 168 hours. Lipid Profile: No results for input(s): CHOL, HDL, LDLCALC, TRIG, CHOLHDL, LDLDIRECT in the last 72 hours. Thyroid Function Tests: No results for input(s): TSH, T4TOTAL, FREET4, T3FREE, THYROIDAB in the last 72 hours. Anemia Panel: No results for input(s): VITAMINB12, FOLATE, FERRITIN, TIBC, IRON, RETICCTPCT in the last 72 hours. Urine analysis:    Component Value Date/Time   COLORURINE YELLOW 05/03/2021 1119   APPEARANCEUR CLOUDY (A) 05/03/2021 1119   LABSPEC 1.014 05/03/2021 1119   PHURINE 8.0 05/03/2021 1119   GLUCOSEU NEGATIVE 05/03/2021 1119   HGBUR MODERATE (A) 05/03/2021 1119   BILIRUBINUR NEGATIVE 05/03/2021 1119   KETONESUR NEGATIVE 05/03/2021 1119   PROTEINUR 100 (A) 05/03/2021 1119   NITRITE NEGATIVE 05/03/2021 1119   LEUKOCYTESUR LARGE (A) 05/03/2021 1119    Radiological Exams on Admission: CT Abdomen Pelvis Wo Contrast  Result Date: 05/03/2021 CLINICAL DATA:  80 year old female with history of abdominal pain. EXAM: CT  ABDOMEN AND PELVIS WITHOUT CONTRAST TECHNIQUE: Multidetector CT imaging of the abdomen and pelvis was performed following the standard protocol without IV contrast. COMPARISON:  No priors. FINDINGS: Lower chest: Atherosclerotic calcifications in the thoracic aorta as well as the left main and left anterior descending coronary arteries. Hepatobiliary: Low-attenuation lesions in the central aspect of the liver measuring up to 1.4 cm in diameter, incompletely characterized on today's non-contrast CT examination, but statistically likely to represent cysts. No other larger more suspicious appearing hepatic lesions noted on today's noncontrast CT examination. Several calcified gallstones are noted in the gallbladder measuring up to 1.7 cm in diameter. Pancreas: No definite pancreatic mass or peripancreatic fluid collections or inflammatory changes noted on today's noncontrast CT examination. Spleen: Unremarkable. Adrenals/Urinary Tract: Low-attenuation lesions in both kidneys, incompletely characterized on today's non-contrast CT examination, but statistically likely to represent cysts, largest of which is in the lower pole of the left kidney measuring 8.8 x 5.7 cm. No hydroureteronephrosis. Urinary bladder is normal in appearance. Bilateral adrenal glands are normal in appearance. Stomach/Bowel: Unenhanced appearance of the stomach is normal. No pathologic dilatation of small bowel or colon. Numerous colonic diverticulae are noted, without definite surrounding inflammatory changes to suggest an acute diverticulitis at this time. The appendix is not confidently identified and may be surgically absent. Regardless, there are no inflammatory changes noted adjacent to the cecum to suggest the presence of an acute appendicitis at this time. Vascular/Lymphatic: Aortic atherosclerosis. No lymphadenopathy noted in the abdomen or pelvis. Reproductive: Uterus is enlarged and heterogeneous in appearance with multiple partially  calcified lesions, largest of which is in the posterior aspect of the uterine body measuring 8.5 cm in diameter. Unenhanced appearance of the ovaries is unremarkable. Other: No significant volume of ascites.  No pneumoperitoneum. Musculoskeletal: There are no aggressive appearing lytic or blastic lesions noted in the visualized portions of the skeleton. IMPRESSION: 1. No acute findings are noted in the abdomen or pelvis to account for the patient's symptoms. 2. Colonic diverticulosis without evidence of acute diverticulitis at this time. 3. Fibroid uterus. 4. Aortic atherosclerosis, as well as left main and left anterior descending coronary artery disease. 5. Additional  incidental findings, as above. Electronically Signed   By: Vinnie Langton M.D.   On: 05/03/2021 13:40   CT Head Wo Contrast  Result Date: 05/03/2021 CLINICAL DATA:  AMS EXAM: CT HEAD WITHOUT CONTRAST CT CERVICAL SPINE WITHOUT CONTRAST TECHNIQUE: Multidetector CT imaging of the head and cervical spine was performed following the standard protocol without intravenous contrast. Multiplanar CT image reconstructions of the cervical spine were also generated. COMPARISON:  09/20/20, 02/05/21. FINDINGS: CT HEAD FINDINGS Brain: No evidence of acute infarction, hemorrhage, hydrocephalus, extra-axial collection or mass lesion/mass effect. Remote RIGHT parietal infarction. Periventricular white matter hypodensities consistent with sequela of chronic microvascular ischemic disease. Global parenchymal volume loss. Revisualization of multiple bilateral lacunar infarctions of the basal ganglia. Vascular: Vascular calcifications. Skull: No acute fracture. Sinuses/Orbits: No acute finding. Other: None. CT CERVICAL SPINE FINDINGS Alignment: Unchanged minimal C3-4 anterolisthesis. Straightening of the cervical lordosis. No new spondylolisthesis. Skull base and vertebrae: No acute fracture. Soft tissues and spinal canal: No prevertebral fluid or swelling. No visible  canal hematoma. Disc levels: Multilevel severe intervertebral disc space height loss at C4-5, C5-6, C6-7 and C7-T1. Multilevel facet arthropathy and uncovertebral hypertrophy. Osseous ankylosis of the RIGHT facets of C2-3. Posterior disc osteophyte complex results in severe canal narrowing at C5-6 and C6-7, unchanged in comparison to prior. Multilevel osseous neuroforaminal narrowing, most pronounced at LEFT C5-6 and C6-7. Upper chest: Unchanged LEFT greater than RIGHT apical scarring. Other: Unchanged appearance of multiple thyroid nodules. IMPRESSION: 1.  No acute intracranial abnormality. 2.  No acute fracture or static subluxation of the cervical spine. Electronically Signed   By: Valentino Saxon MD   On: 05/03/2021 11:59   CT Cervical Spine Wo Contrast  Result Date: 05/03/2021 CLINICAL DATA:  AMS EXAM: CT HEAD WITHOUT CONTRAST CT CERVICAL SPINE WITHOUT CONTRAST TECHNIQUE: Multidetector CT imaging of the head and cervical spine was performed following the standard protocol without intravenous contrast. Multiplanar CT image reconstructions of the cervical spine were also generated. COMPARISON:  09/20/20, 02/05/21. FINDINGS: CT HEAD FINDINGS Brain: No evidence of acute infarction, hemorrhage, hydrocephalus, extra-axial collection or mass lesion/mass effect. Remote RIGHT parietal infarction. Periventricular white matter hypodensities consistent with sequela of chronic microvascular ischemic disease. Global parenchymal volume loss. Revisualization of multiple bilateral lacunar infarctions of the basal ganglia. Vascular: Vascular calcifications. Skull: No acute fracture. Sinuses/Orbits: No acute finding. Other: None. CT CERVICAL SPINE FINDINGS Alignment: Unchanged minimal C3-4 anterolisthesis. Straightening of the cervical lordosis. No new spondylolisthesis. Skull base and vertebrae: No acute fracture. Soft tissues and spinal canal: No prevertebral fluid or swelling. No visible canal hematoma. Disc levels:  Multilevel severe intervertebral disc space height loss at C4-5, C5-6, C6-7 and C7-T1. Multilevel facet arthropathy and uncovertebral hypertrophy. Osseous ankylosis of the RIGHT facets of C2-3. Posterior disc osteophyte complex results in severe canal narrowing at C5-6 and C6-7, unchanged in comparison to prior. Multilevel osseous neuroforaminal narrowing, most pronounced at LEFT C5-6 and C6-7. Upper chest: Unchanged LEFT greater than RIGHT apical scarring. Other: Unchanged appearance of multiple thyroid nodules. IMPRESSION: 1.  No acute intracranial abnormality. 2.  No acute fracture or static subluxation of the cervical spine. Electronically Signed   By: Valentino Saxon MD   On: 05/03/2021 11:59   DG Chest Port 1 View  Result Date: 05/03/2021 CLINICAL DATA:  Sepsis EXAM: PORTABLE CHEST 1 VIEW COMPARISON:  September 19, 2020 FINDINGS: The cardiomediastinal silhouette is unchanged in contour.Unchanged elevation of the RIGHT hemidiaphragm. Atherosclerotic calcifications. No pleural effusion. No pneumothorax. No acute pleuroparenchymal abnormality. Visualized abdomen  is unremarkable. Multilevel degenerative changes of the thoracic spine. IMPRESSION: No acute cardiopulmonary abnormality. Electronically Signed   By: Valentino Saxon MD   On: 05/03/2021 11:43    EKG: Independently reviewed. A fib, no st elevations  Assessment/Plan Sepsis from unknown source Acute on chronic metabolic encephalopathy     - admit to inpt, sdu     - continue vanc, cefepime, flagyl for now; continue fluids     - follow bld cx, Ucx     - APAP for fever     - check procal, follow lactic acid     - CTH, C c-spine ok; on infection on CT ab/pelvis  Hx of HTN     - hold BP meds for now as she is on the hypotensive side  HLD     - continue statin  Dementia     - continue home regimen  A fib     - holding metoprolol d/t relative hypotension; continue eliquis  DVT prophylaxis: eliquis  Code Status: FULL (confirmed w/  husband at bedside)  Family Communication: w/ husband at bedside  Consults called: None   Status is: Inpatient  Remains inpatient appropriate because:Inpatient level of care appropriate due to severity of illness   Dispo: The patient is from: SNF              Anticipated d/c is to: SNF              Patient currently is not medically stable to d/c.   Difficult to place patient No  Time spent coordinating admission: 70 minutes  [facility]  If 7PM-7AM, please contact night-coverage [email or web address]  05/03/2021, 3:16 PM

## 2021-05-04 DIAGNOSIS — L8990 Pressure ulcer of unspecified site, unspecified stage: Secondary | ICD-10-CM | POA: Insufficient documentation

## 2021-05-04 LAB — GLUCOSE, CAPILLARY
Glucose-Capillary: 139 mg/dL — ABNORMAL HIGH (ref 70–99)
Glucose-Capillary: 65 mg/dL — ABNORMAL LOW (ref 70–99)
Glucose-Capillary: 92 mg/dL (ref 70–99)

## 2021-05-04 LAB — COMPREHENSIVE METABOLIC PANEL
ALT: 54 U/L — ABNORMAL HIGH (ref 0–44)
AST: 59 U/L — ABNORMAL HIGH (ref 15–41)
Albumin: 2.5 g/dL — ABNORMAL LOW (ref 3.5–5.0)
Alkaline Phosphatase: 75 U/L (ref 38–126)
Anion gap: 7 (ref 5–15)
BUN: 32 mg/dL — ABNORMAL HIGH (ref 8–23)
CO2: 25 mmol/L (ref 22–32)
Calcium: 8.7 mg/dL — ABNORMAL LOW (ref 8.9–10.3)
Chloride: 105 mmol/L (ref 98–111)
Creatinine, Ser: 1.17 mg/dL — ABNORMAL HIGH (ref 0.44–1.00)
GFR, Estimated: 47 mL/min — ABNORMAL LOW (ref >60)
Glucose, Bld: 68 mg/dL — ABNORMAL LOW (ref 70–99)
Potassium: 4 mmol/L (ref 3.5–5.1)
Sodium: 137 mmol/L (ref 135–145)
Total Bilirubin: 0.7 mg/dL (ref 0.3–1.2)
Total Protein: 5.3 g/dL — ABNORMAL LOW (ref 6.5–8.1)

## 2021-05-04 LAB — CBC
HCT: 39.3 % (ref 36.0–46.0)
Hemoglobin: 13.2 g/dL (ref 12.0–15.0)
MCH: 32.8 pg (ref 26.0–34.0)
MCHC: 33.6 g/dL (ref 30.0–36.0)
MCV: 97.5 fL (ref 80.0–100.0)
Platelets: 130 10*3/uL — ABNORMAL LOW (ref 150–400)
RBC: 4.03 MIL/uL (ref 3.87–5.11)
RDW: 13.8 % (ref 11.5–15.5)
WBC: 5.8 10*3/uL (ref 4.0–10.5)
nRBC: 0 % (ref 0.0–0.2)

## 2021-05-04 LAB — PROTIME-INR
INR: 1.4 — ABNORMAL HIGH (ref 0.8–1.2)
Prothrombin Time: 16.8 seconds — ABNORMAL HIGH (ref 11.4–15.2)

## 2021-05-04 LAB — CORTISOL-AM, BLOOD: Cortisol - AM: 15.3 ug/dL (ref 6.7–22.6)

## 2021-05-04 LAB — MAGNESIUM: Magnesium: 2.7 mg/dL — ABNORMAL HIGH (ref 1.7–2.4)

## 2021-05-04 LAB — PHOSPHORUS: Phosphorus: 2.4 mg/dL — ABNORMAL LOW (ref 2.5–4.6)

## 2021-05-04 LAB — PROCALCITONIN: Procalcitonin: <0.1 ng/mL

## 2021-05-04 MED ORDER — DEXTROSE 50 % IV SOLN
INTRAVENOUS | Status: AC
  Filled 2021-05-04: qty 50

## 2021-05-04 MED ORDER — DEXTROSE 50 % IV SOLN
25.0000 mL | Freq: Once | INTRAVENOUS | Status: AC
  Administered 2021-05-04: 25 mL via INTRAVENOUS

## 2021-05-04 MED ORDER — SODIUM CHLORIDE 0.9 % IV SOLN
200.0000 mg | Freq: Once | INTRAVENOUS | Status: AC
  Administered 2021-05-04: 200 mg via INTRAVENOUS
  Filled 2021-05-04: qty 40

## 2021-05-04 MED ORDER — SODIUM CHLORIDE 0.9 % IV BOLUS
500.0000 mL | Freq: Once | INTRAVENOUS | Status: AC
  Administered 2021-05-04: 500 mL via INTRAVENOUS

## 2021-05-04 MED ORDER — SODIUM CHLORIDE 0.9 % IV SOLN
100.0000 mg | Freq: Every day | INTRAVENOUS | Status: AC
  Administered 2021-05-05 – 2021-05-06 (×2): 100 mg via INTRAVENOUS
  Filled 2021-05-04 (×2): qty 20

## 2021-05-04 MED ORDER — DEXTROSE 50 % IV SOLN
INTRAVENOUS | Status: AC
  Administered 2021-05-04: 25 mL
  Filled 2021-05-04: qty 50

## 2021-05-04 MED ORDER — SODIUM CHLORIDE 0.9 % IV BOLUS: 500.0000 mL | Freq: Once | INTRAVENOUS | Status: DC

## 2021-05-04 MED ORDER — DEXTROSE-NACL 5-0.9 % IV SOLN: INTRAVENOUS | Status: DC

## 2021-05-04 NOTE — Progress Notes (Signed)
Hypoglycemic Event  CBG: 64  Treatment: 72mL D50 Symptoms: Asymptomatic  Follow-up CBG: Time: 0912 CBG Result:117  Possible Reasons for Event: NPO  Comments/MD notified:MD made aware   Ida Rogue

## 2021-05-04 NOTE — Progress Notes (Signed)
Hypoglycemic Event  CBG: 65  Treatment: d50 1/2 amp  Symptoms: asymptomatic  Follow-up CBG Result: 139  Possible Reasons for Event: NPO  Comments/MD notified: Paged with Pamella Pert, Canary Brim

## 2021-05-04 NOTE — Progress Notes (Signed)
PROGRESS NOTE    Jennifer Arroyo  DGL:[id number] DOB: [birth year] DOA: 05/03/2021 PCP: Pcp, No    Brief Narrative:  This 80 years old female with PMH significant for dementia, hyperlipidemia, hypertension, A. fib, CVA brought in the ED after being found down at her facility.  History per chart review.  She apparently had a unwitnessed fall.  No apparent head injury.  Facility reports she had been increasingly agitated recently,  they believed it could be from a UTI so she was empirically treated for 7 days with Macrobid,  no cultures were sent.  Her agitation has not improved. she was sent in the hospital. She was hypothermic, hypotensive,  tachypneic and has elevated lactic acid.  Patient is started on sepsis protocol. She is found to be COVID+ and is started on remdesivir given high fevers.  Assessment & Plan:   Active Problems:   Sepsis ([place])   Pressure injury of skin  Severe Sepsis secondary to UTI. Patient was sent from SNF after being found down. She was hypotensive, tachypneic, hypothermic with elevated lactic acid. She has received fluid boluses as per sepsis protocol. UA : LE+,  follow blood cultures,  urine cultures. Started on empiric antibiotics vancomycin, cefepime and Flagyl. Procalcitonin normal, lactic acid 3.3. CT abdomen and pelvis no acute abnormality. Antibiotic de-escalated to cefepime only. Continue to monitor clinical status.  Acute on chronic metabolic encephalopathy  AMS could be secondary to UTI versus COVID. CT head negative for any acute abnormality. She was COVID-positive 3 weeks ago,  COVID test positive, continue supportive care. Start remdesivir for 3-day treatment. She is not hypoxic,  will hold on steroids. Neurochecks every 4-6 hours. Consider neuro consult if she continues to remain lethargic.  Hypertension: Hold BP meds for now as she is on the hypotensive side.  HLD Continue statin  Dementia Continue home medications  A fib Hold  metoprolol, continue Eliquis.  Goals of care discussion.: Husband was notified about poor prognosis. Palliative care consulted to discuss goals of care.   DVT prophylaxis: Eliquis Code Status:DNR Family Communication: Spoke with husband. Disposition Plan:    Status is: Inpatient  Remains inpatient appropriate because:Inpatient level of care appropriate due to severity of illness   Dispo: The patient is from: Home              Anticipated d/c is to: Home              Patient currently is not medically stable to d/c.   Difficult to place patient No  Consultants:   None.  Procedures:  Antimicrobials:   Anti-infectives (From admission, onward)   Start     Dose/Rate Route Frequency Ordered Stop   05/05/21 1000  remdesivir 100 mg in sodium chloride 0.9 % 100 mL IVPB       "Followed by" Linked Group Details   100 mg 200 mL/hr over 30 Minutes Intravenous Daily 05/04/21 1141 05/07/21 0959   05/04/21 1230  remdesivir 200 mg in sodium chloride 0.9% 250 mL IVPB       "Followed by" Linked Group Details   200 mg 580 mL/hr over 30 Minutes Intravenous Once 05/04/21 1141 05/04/21 1258   05/04/21 1000  valACYclovir (VALTREX) tablet 500 mg        500 mg Oral Daily 05/03/21 1717     05/03/21 2200  metroNIDAZOLE (FLAGYL) IVPB 500 mg  Status:  Discontinued        500 mg 100 mL/hr over 60 Minutes Intravenous Every 8  hours 05/03/21 1717 05/04/21 1141   05/03/21 2200  ceFEPIme (MAXIPIME) 2 g in sodium chloride 0.9 % 100 mL IVPB        2 g 200 mL/hr over 30 Minutes Intravenous Every 12 hours 05/03/21 1554     05/03/21 2200  vancomycin (VANCOCIN) IVPB 1000 mg/200 mL premix  Status:  Discontinued        1,000 mg 200 mL/hr over 60 Minutes Intravenous Every 24 hours 05/03/21 1554 05/04/21 1141   05/03/21 1345  aztreonam (AZACTAM) 2 g in sodium chloride 0.9 % 100 mL IVPB        2 g 200 mL/hr over 30 Minutes Intravenous  Once 05/03/21 1332 05/03/21 1430   05/03/21 1345  metroNIDAZOLE (FLAGYL)  IVPB 500 mg        500 mg 100 mL/hr over 60 Minutes Intravenous  Once 05/03/21 1332 05/03/21 1503   05/03/21 1345  vancomycin (VANCOCIN) IVPB 1000 mg/200 mL premix        1,000 mg 200 mL/hr over 60 Minutes Intravenous  Once 05/03/21 1332 05/03/21 1502      Subjective: Patient was seen and examined at bedside.  Overnight events noted.  Patient is very lethargic, does not open eyes on painful stimuli.  She is hypotensive requiring fluid boluses.  Objective: Vitals:   05/04/21 1100 05/04/21 1200 05/04/21 1201 05/04/21 1300  BP: (!) 101/41 (!) 104/47  (!) 124/58  Pulse: 82 72  69  Resp: 20     Temp:   98.2 F (36.8 C)   TempSrc:   Oral   SpO2: 95% 94%  94%  Weight:        Intake/Output Summary (Last 24 hours) at 05/04/2021 1347 Last data filed at 05/04/2021 1305 Gross per 24 hour  Intake 5677.55 ml  Output 775 ml  Net 4902.55 ml   Filed Weights   05/03/21 1700 05/04/21 0400  Weight: 84.2 kg 86 kg    Examination:  General exam: Appears calm and comfortable, very lethargic, does not open eyes on painful stimuli. Respiratory system: Clear to auscultation. Respiratory effort normal. Cardiovascular system: S1 & S2 heard, RRR. No JVD, murmurs, rubs, gallops or clicks. No pedal edema. Gastrointestinal system: Abdomen is nondistended, soft and nontender. No organomegaly or masses felt. Normal bowel sounds heard. Central nervous system: Lethargic, not assessed. Extremities: No edema, no cyanosis, no clubbing. Skin: No rashes, lesions or ulcers Psychiatry: Judgement and insight appear normal. Mood & affect appropriate.     Data Reviewed: I have personally reviewed following labs and imaging studies  CBC: Recent Labs  Lab 05/03/21 1159 05/04/21 0300  WBC 7.9 5.8  NEUTROABS 5.9  --   HGB 16.2* 13.2  HCT 47.7* 39.3  MCV 96.4 97.5  PLT 170 193*   Basic Metabolic Panel: Recent Labs  Lab 05/03/21 1159 05/04/21 0300  NA 136 137  K 3.5 4.0  CL 92* 105  CO2 31 25   GLUCOSE 91 68*  BUN 42* 32*  CREATININE 1.09* 1.17*  CALCIUM 10.3 8.7*  MG  --  2.7*  PHOS  --  2.4*   GFR: Estimated Creatinine Clearance: 42.4 mL/min (A) (by C-G formula based on SCr of 1.17 mg/dL (H)). Liver Function Tests: Recent Labs  Lab 05/03/21 1159 05/04/21 0300  AST 86* 59*  ALT 78* 54*  ALKPHOS 94 75  BILITOT 0.1* 0.7  PROT 7.3 5.3*  ALBUMIN 3.5 2.5*   No results for input(s): LIPASE, AMYLASE in the last 168 hours. No results  for input(s): AMMONIA in the last 168 hours. Coagulation Profile: Recent Labs  Lab 05/03/21 1159 05/04/21 0300  INR 1.3* 1.4*   Cardiac Enzymes: No results for input(s): CKTOTAL, CKMB, CKMBINDEX, TROPONINI in the last 168 hours. BNP (last 3 results) No results for input(s): PROBNP in the last 8760 hours. HbA1C: No results for input(s): HGBA1C in the last 72 hours. CBG: Recent Labs  Lab 05/03/21 1853 05/03/21 2354 05/04/21 0346 05/04/21 0414 05/04/21 1148  GLUCAP 110* 113* 65* 139* 92   Lipid Profile: No results for input(s): CHOL, HDL, LDLCALC, TRIG, CHOLHDL, LDLDIRECT in the last 72 hours. Thyroid Function Tests: No results for input(s): TSH, T4TOTAL, FREET4, T3FREE, THYROIDAB in the last 72 hours. Anemia Panel: No results for input(s): VITAMINB12, FOLATE, FERRITIN, TIBC, IRON, RETICCTPCT in the last 72 hours. Sepsis Labs: Recent Labs  Lab 05/03/21 1159 05/04/21 0300  PROCALCITON  --  <0.10  LATICACIDVEN 1.8  3.3*  --     Recent Results (from the past 240 hour(s))  Culture, blood (routine x 2)     Status: None (Preliminary result)   Collection Time: 05/03/21 11:32 AM   Specimen: BLOOD  Result Value Ref Range Status   Specimen Description   Final    BLOOD SITE NOT SPECIFIED Performed at [facility] [address] [place], [place] [address]    Special Requests   Final    BOTTLES DRAWN AEROBIC AND ANAEROBIC Blood Culture results may not be optimal due to an inadequate volume of blood  received in culture bottles Performed at [facility] [address] [place], [place] [address]    Culture   Final    NO GROWTH < 24 HOURS Performed at [facility] [address], [place], [place] [address]    Report Status PENDING  Incomplete  Culture, blood (routine x 2)     Status: None (Preliminary result)   Collection Time: 05/03/21 11:59 AM   Specimen: BLOOD  Result Value Ref Range Status   Specimen Description   Final    BLOOD SITE NOT SPECIFIED Performed at [facility] [address], [place], [place] [address]    Special Requests   Final    BOTTLES DRAWN AEROBIC ONLY Blood Culture adequate volume Performed at [facility] [address], [place], [place] [address]    Culture   Final    NO GROWTH < 24 HOURS Performed at [facility] [address], [place],  [address]    Report Status PENDING  Incomplete  Resp Panel by RT-PCR (Flu A&B, Covid) Nasopharyngeal Swab     Status: Abnormal   Collection Time: 05/03/21  2:30 PM   Specimen: Nasopharyngeal Swab; Nasopharyngeal(NP) swabs in vial transport medium  Result Value Ref Range Status   SARS Coronavirus 2 by RT PCR POSITIVE (A) NEGATIVE Final    Comment: RESULT CALLED TO, READ BACK BY AND VERIFIED WITH: M,HAILEY AT 1614 ON 05/03/21 BY A,MOHAMED (NOTE) SARS-CoV-2 target nucleic acids are DETECTED.  The SARS-CoV-2 RNA is generally detectable in upper respiratory specimens during the acute phase of infection. Positive results are indicative of the presence of the identified virus, but do not rule out bacterial infection or co-infection with other pathogens not detected by the test. Clinical correlation with patient history and other diagnostic information is necessary to determine patient infection status. The expected result is Negative.  Fact Sheet for Patients: EntrepreneurPulse.com.au  Fact Sheet for Healthcare  Providers:  IncredibleEmployment.be  This test is not yet approved or cleared by the [place] and  has been authorized for detection and/or diagnosis of SARS-CoV-2 by FDA under an Emergency Use Authorization (EUA).  This EUA will remain in effect (meaning this test  can be used) for the duration of  the COVID-19 declaration under Section 564(b)(1) of the Act, 21 U.S.C. section 360bbb-3(b)(1), unless the authorization is terminated or revoked sooner.     Influenza A by PCR NEGATIVE NEGATIVE Final   Influenza B by PCR NEGATIVE NEGATIVE Final    Comment: (NOTE) The Xpert Xpress SARS-CoV-2/FLU/RSV plus assay is intended as an aid in the diagnosis of influenza from Nasopharyngeal swab specimens and should not be used as a sole basis for treatment. Nasal washings and aspirates are unacceptable for Xpert Xpress SARS-CoV-2/FLU/RSV testing.  Fact Sheet for Patients: EntrepreneurPulse.com.au  Fact Sheet for Healthcare Providers: IncredibleEmployment.be  This test is not yet approved or cleared by the [place] FDA and has been authorized for detection and/or diagnosis of SARS-CoV-2 by FDA under an Emergency Use Authorization (EUA). This EUA will remain in effect (meaning this test can be used) for the duration of the COVID-19 declaration under Section 564(b)(1) of the Act, 21 U.S.C. section 360bbb-3(b)(1), unless the authorization is terminated or revoked.  Performed at [facility], [place] [address], [place], [place] [address]   MRSA PCR Screening     Status: None   Collection Time: 05/03/21  6:18 PM   Specimen: Nasal Mucosa; Nasopharyngeal  Result Value Ref Range Status   MRSA by PCR NEGATIVE NEGATIVE Final    Comment:        The GeneXpert MRSA Assay (FDA approved for NASAL specimens only), is one component of a comprehensive MRSA colonization surveillance program. It is not intended to diagnose  MRSA infection nor to guide or monitor treatment for MRSA infections. Performed at [facility], [place] [address], [place], [place] [address]     Radiology Studies: CT Abdomen Pelvis Wo Contrast  Result Date: 05/03/2021 CLINICAL DATA:  80 year old female with history of abdominal pain. EXAM: CT ABDOMEN AND PELVIS WITHOUT CONTRAST TECHNIQUE: Multidetector CT imaging of the abdomen and pelvis was performed following the standard protocol without IV contrast. COMPARISON:  No priors. FINDINGS: Lower chest: Atherosclerotic calcifications in the thoracic aorta as well as the left main and left anterior descending coronary arteries. Hepatobiliary: Low-attenuation lesions in the central aspect of the liver measuring up to 1.4 cm in diameter, incompletely characterized on today's non-contrast CT examination, but statistically likely to represent cysts. No other larger more suspicious appearing hepatic lesions noted on today's noncontrast CT examination. Several calcified gallstones are noted in the gallbladder measuring up to 1.7 cm in diameter. Pancreas: No definite pancreatic mass or peripancreatic fluid collections or inflammatory changes noted on today's noncontrast CT examination. Spleen: Unremarkable. Adrenals/Urinary Tract: Low-attenuation lesions in both kidneys, incompletely characterized on today's non-contrast CT examination, but statistically likely to represent cysts, largest of which is in the lower pole of the left kidney measuring 8.8 x 5.7 cm. No hydroureteronephrosis. Urinary bladder is normal in appearance. Bilateral adrenal glands are normal in appearance. Stomach/Bowel: Unenhanced appearance of the stomach is normal. No pathologic dilatation of small bowel or colon. Numerous colonic diverticulae are noted, without definite surrounding inflammatory changes to suggest an acute diverticulitis at this time. The appendix is not confidently identified and may be surgically absent.  Regardless, there are no inflammatory changes noted adjacent to the cecum  to suggest the presence of an acute appendicitis at this time. Vascular/Lymphatic: Aortic atherosclerosis. No lymphadenopathy noted in the abdomen or pelvis. Reproductive: Uterus is enlarged and heterogeneous in appearance with multiple partially calcified lesions, largest of which is in the posterior aspect of the uterine body measuring 8.5 cm in diameter. Unenhanced appearance of the ovaries is unremarkable. Other: No significant volume of ascites.  No pneumoperitoneum. Musculoskeletal: There are no aggressive appearing lytic or blastic lesions noted in the visualized portions of the skeleton. IMPRESSION: 1. No acute findings are noted in the abdomen or pelvis to account for the patient's symptoms. 2. Colonic diverticulosis without evidence of acute diverticulitis at this time. 3. Fibroid uterus. 4. Aortic atherosclerosis, as well as left main and left anterior descending coronary artery disease. 5. Additional incidental findings, as above. Electronically Signed   By: Vinnie Langton M.D.   On: 05/03/2021 13:40   CT Head Wo Contrast  Result Date: 05/03/2021 CLINICAL DATA:  AMS EXAM: CT HEAD WITHOUT CONTRAST CT CERVICAL SPINE WITHOUT CONTRAST TECHNIQUE: Multidetector CT imaging of the head and cervical spine was performed following the standard protocol without intravenous contrast. Multiplanar CT image reconstructions of the cervical spine were also generated. COMPARISON:  09/20/20, 02/05/21. FINDINGS: CT HEAD FINDINGS Brain: No evidence of acute infarction, hemorrhage, hydrocephalus, extra-axial collection or mass lesion/mass effect. Remote RIGHT parietal infarction. Periventricular white matter hypodensities consistent with sequela of chronic microvascular ischemic disease. Global parenchymal volume loss. Revisualization of multiple bilateral lacunar infarctions of the basal ganglia. Vascular: Vascular calcifications. Skull: No acute  fracture. Sinuses/Orbits: No acute finding. Other: None. CT CERVICAL SPINE FINDINGS Alignment: Unchanged minimal C3-4 anterolisthesis. Straightening of the cervical lordosis. No new spondylolisthesis. Skull base and vertebrae: No acute fracture. Soft tissues and spinal canal: No prevertebral fluid or swelling. No visible canal hematoma. Disc levels: Multilevel severe intervertebral disc space height loss at C4-5, C5-6, C6-7 and C7-T1. Multilevel facet arthropathy and uncovertebral hypertrophy. Osseous ankylosis of the RIGHT facets of C2-3. Posterior disc osteophyte complex results in severe canal narrowing at C5-6 and C6-7, unchanged in comparison to prior. Multilevel osseous neuroforaminal narrowing, most pronounced at LEFT C5-6 and C6-7. Upper chest: Unchanged LEFT greater than RIGHT apical scarring. Other: Unchanged appearance of multiple thyroid nodules. IMPRESSION: 1.  No acute intracranial abnormality. 2.  No acute fracture or static subluxation of the cervical spine. Electronically Signed   By: Valentino Saxon MD   On: 05/03/2021 11:59   CT Cervical Spine Wo Contrast  Result Date: 05/03/2021 CLINICAL DATA:  AMS EXAM: CT HEAD WITHOUT CONTRAST CT CERVICAL SPINE WITHOUT CONTRAST TECHNIQUE: Multidetector CT imaging of the head and cervical spine was performed following the standard protocol without intravenous contrast. Multiplanar CT image reconstructions of the cervical spine were also generated. COMPARISON:  09/20/20, 02/05/21. FINDINGS: CT HEAD FINDINGS Brain: No evidence of acute infarction, hemorrhage, hydrocephalus, extra-axial collection or mass lesion/mass effect. Remote RIGHT parietal infarction. Periventricular white matter hypodensities consistent with sequela of chronic microvascular ischemic disease. Global parenchymal volume loss. Revisualization of multiple bilateral lacunar infarctions of the basal ganglia. Vascular: Vascular calcifications. Skull: No acute fracture. Sinuses/Orbits: No acute  finding. Other: None. CT CERVICAL SPINE FINDINGS Alignment: Unchanged minimal C3-4 anterolisthesis. Straightening of the cervical lordosis. No new spondylolisthesis. Skull base and vertebrae: No acute fracture. Soft tissues and spinal canal: No prevertebral fluid or swelling. No visible canal hematoma. Disc levels: Multilevel severe intervertebral disc space height loss at C4-5, C5-6, C6-7 and C7-T1. Multilevel facet arthropathy and uncovertebral hypertrophy. Osseous ankylosis  of the RIGHT facets of C2-3. Posterior disc osteophyte complex results in severe canal narrowing at C5-6 and C6-7, unchanged in comparison to prior. Multilevel osseous neuroforaminal narrowing, most pronounced at LEFT C5-6 and C6-7. Upper chest: Unchanged LEFT greater than RIGHT apical scarring. Other: Unchanged appearance of multiple thyroid nodules. IMPRESSION: 1.  No acute intracranial abnormality. 2.  No acute fracture or static subluxation of the cervical spine. Electronically Signed   By: Valentino Saxon MD   On: 05/03/2021 11:59   DG Chest Port 1 View  Result Date: 05/03/2021 CLINICAL DATA:  Sepsis EXAM: PORTABLE CHEST 1 VIEW COMPARISON:  September 19, 2020 FINDINGS: The cardiomediastinal silhouette is unchanged in contour.Unchanged elevation of the RIGHT hemidiaphragm. Atherosclerotic calcifications. No pleural effusion. No pneumothorax. No acute pleuroparenchymal abnormality. Visualized abdomen is unremarkable. Multilevel degenerative changes of the thoracic spine. IMPRESSION: No acute cardiopulmonary abnormality. Electronically Signed   By: Valentino Saxon MD   On: 05/03/2021 11:43   Scheduled Meds: . apixaban  5 mg Oral BID  . Chlorhexidine Gluconate Cloth  6 each Topical Daily  . clotrimazole  1 application Topical BID  . mouth rinse  15 mL Mouth Rinse BID  . memantine  10 mg Oral BID  . rosuvastatin  10 mg Oral q1800  . valACYclovir  500 mg Oral Daily   Continuous Infusions: . ceFEPime (MAXIPIME) IV Stopped  (05/04/21 1111)  . dextrose 5 % and 0.9% NaCl 75 mL/hr at 05/04/21 1305  . [START ON 05/05/2021] remdesivir 100 mg in NS 100 mL       LOS: 1 day    Time spent: 35 mins    Chika Cichowski, MD [facility]   If 7PM-7AM, please contact night-coverage

## 2021-05-05 DIAGNOSIS — I4891 Unspecified atrial fibrillation: Secondary | ICD-10-CM | POA: Diagnosis not present

## 2021-05-05 DIAGNOSIS — F0390 Unspecified dementia without behavioral disturbance: Secondary | ICD-10-CM

## 2021-05-05 DIAGNOSIS — Z7189 Other specified counseling: Secondary | ICD-10-CM

## 2021-05-05 DIAGNOSIS — Z515 Encounter for palliative care: Secondary | ICD-10-CM

## 2021-05-05 DIAGNOSIS — N390 Urinary tract infection, site not specified: Secondary | ICD-10-CM

## 2021-05-05 LAB — COMPREHENSIVE METABOLIC PANEL
ALT: 46 U/L — ABNORMAL HIGH (ref 0–44)
AST: 53 U/L — ABNORMAL HIGH (ref 15–41)
Albumin: 2.4 g/dL — ABNORMAL LOW (ref 3.5–5.0)
Alkaline Phosphatase: 68 U/L (ref 38–126)
Anion gap: 7 (ref 5–15)
BUN: 27 mg/dL — ABNORMAL HIGH (ref 8–23)
CO2: 23 mmol/L (ref 22–32)
Calcium: 8.2 mg/dL — ABNORMAL LOW (ref 8.9–10.3)
Chloride: 111 mmol/L (ref 98–111)
Creatinine, Ser: 1.12 mg/dL — ABNORMAL HIGH (ref 0.44–1.00)
GFR, Estimated: 50 mL/min — ABNORMAL LOW (ref >60)
Glucose, Bld: 88 mg/dL (ref 70–99)
Potassium: 3.4 mmol/L — ABNORMAL LOW (ref 3.5–5.1)
Sodium: 141 mmol/L (ref 135–145)
Total Bilirubin: 0.6 mg/dL (ref 0.3–1.2)
Total Protein: 5.2 g/dL — ABNORMAL LOW (ref 6.5–8.1)

## 2021-05-05 LAB — CBC
HCT: 41 % (ref 36.0–46.0)
Hemoglobin: 13.5 g/dL (ref 12.0–15.0)
MCH: 32.8 pg (ref 26.0–34.0)
MCHC: 32.9 g/dL (ref 30.0–36.0)
MCV: 99.5 fL (ref 80.0–100.0)
Platelets: 99 10*3/uL — ABNORMAL LOW (ref 150–400)
RBC: 4.12 MIL/uL (ref 3.87–5.11)
RDW: 14.4 % (ref 11.5–15.5)
WBC: 6.4 10*3/uL (ref 4.0–10.5)
nRBC: 0 % (ref 0.0–0.2)

## 2021-05-05 LAB — URINE CULTURE

## 2021-05-05 LAB — PHOSPHORUS: Phosphorus: 2.6 mg/dL (ref 2.5–4.6)

## 2021-05-05 LAB — GLUCOSE, CAPILLARY: Glucose-Capillary: 94 mg/dL (ref 70–99)

## 2021-05-05 LAB — MAGNESIUM: Magnesium: 2.3 mg/dL (ref 1.7–2.4)

## 2021-05-05 LAB — TSH: TSH: 1.205 u[IU]/mL (ref 0.350–4.500)

## 2021-05-05 MED ORDER — AMIODARONE HCL IN DEXTROSE 360-4.14 MG/200ML-% IV SOLN
60.0000 mg/h | INTRAVENOUS | Status: AC
  Administered 2021-05-05: 60 mg/h via INTRAVENOUS
  Filled 2021-05-05: qty 200

## 2021-05-05 MED ORDER — METOPROLOL TARTRATE 5 MG/5ML IV SOLN
5.0000 mg | Freq: Four times a day (QID) | INTRAVENOUS | Status: DC | PRN
  Administered 2021-05-05 – 2021-05-07 (×3): 5 mg via INTRAVENOUS
  Filled 2021-05-05 (×3): qty 5

## 2021-05-05 MED ORDER — SODIUM CHLORIDE 0.9 % IV BOLUS
500.0000 mL | Freq: Once | INTRAVENOUS | Status: AC
  Administered 2021-05-05: 500 mL via INTRAVENOUS

## 2021-05-05 MED ORDER — POTASSIUM CHLORIDE CRYS ER 20 MEQ PO TBCR
20.0000 meq | EXTENDED_RELEASE_TABLET | Freq: Once | ORAL | Status: AC
  Administered 2021-05-05: 20 meq via ORAL
  Filled 2021-05-05: qty 1

## 2021-05-05 MED ORDER — AMIODARONE HCL IN DEXTROSE 360-4.14 MG/200ML-% IV SOLN
30.0000 mg/h | INTRAVENOUS | Status: DC
  Administered 2021-05-05 – 2021-05-09 (×7): 30 mg/h via INTRAVENOUS
  Filled 2021-05-05 (×7): qty 200

## 2021-05-05 NOTE — Consult Note (Signed)
Cardiology Consultation:   Patient ID: Jennifer Arroyo MRN: [id number]; DOB: 30-Aug-1941  Admit date: 05/03/2021 Date of Consult: 05/05/2021  PCP:  Merryl Hacker No   [facility] Providers Cardiologist:  Freada Bergeron, MD   Patient Profile:   Jennifer Arroyo is a 80 y.o. female with a hx of HTN, HLD, dementia, PAF on eliquis, LBBB, prior CVA, mild carotid artery disease, and dementia who is being seen 05/05/2021 for the evaluation of Afib RVR in the setting of sepsis at the request of Dr. Dwyane Dee.  History of Present Illness:   Jennifer Arroyo was first seen by our service in Oct 2021 with new onset atrial fibrillation. Initial concern for CVA, MRI wit chronic cortical stroke. Found to have mild carotid artery disease. She converted to sinus rhythm in cardizem gtt. Started on eliquis for a CHADSVASC of 7. Echo at that time with normal EF and no significant valvular abnormalities. She was seen in follow up Dec 2021. She was doing well on toprol and eliquis.  Pt had an unwitnessed fall with no apparent head injury. She was found down and BIB EMS. Facility reported increased agitatio and she completed 7 days of macrobid. Agitation had not improved. On arrival, she was hypothermic, hypotensive, tachypneic, and acidotic. Sepsis protocol started, COVID+ and started on remdesivir. On further review, she was COVID + 3 weeks ago. Supportive care continued, no steroid treatment.   Urine culture contaminated. On vanc, cefepime, and flagyl. Blood cultures with NGTD. ABX de-escalated, cefepime only.   Telemetry with atrial fibrillation with RVR and marginal pressure. She is anticoagulated with eliquis. Cardiology consulted.   Unable to get any history from the patient with her dementia.    Palliative has also been consulted.     Past Medical History:  Diagnosis Date  . Bilateral bunions    hammer toe  . Cervical os stenosis   . CVA (cerebral vascular accident) ([place])   . Gallstones   . Hemangioma of  liver    probable  . Hemorrhoids   . Hyperlipemia   . Hypertension   . Insomnia   . Left bundle branch block   . Left bundle branch block   . Low back pain   . Memory loss   . Plantar fasciitis   . Rectocele   . Renal cyst   . Stroke ([place]) 01/2018    Past Surgical History:  Procedure Laterality Date  . APPENDECTOMY    . COLONOSCOPY    . TONSILLECTOMY       Home Medications:  Prior to Admission medications   Medication Sig Start Date End Date Taking? Authorizing Provider  apixaban (ELIQUIS) 5 MG TABS tablet Take 1 tablet (5 mg total) by mouth 2 (two) times daily. 11/17/20  Yes Freada Bergeron, MD  Biotin 2500 MCG CAPS Take 2,500 mg by mouth daily.   Yes [provider]  Calcium Carb-Cholecalciferol (CALCIUM-VITAMIN D3) 600-200 MG-UNIT TABS Take 1 tablet by mouth daily.   Yes [provider]  carboxymethylcellulose 1 % ophthalmic solution Place 2 drops into both eyes daily.   Yes [provider]  Cholecalciferol (VITAMIN D) 50 MCG (2000 UT) tablet Take 2,000 Units by mouth daily.   Yes [provider]  clotrimazole (LOTRIMIN) 1 % cream Apply 1 application topically 2 (two) times daily. Apply to abdominal folds topically two times daily for yeast   Yes [provider]  Coenzyme Q10 (CO Q-10) 100 MG CAPS Take 100 mg by mouth daily.  Yes [provider]  Cyanocobalamin 1000 MCG TBCR Take 1,000 mcg by mouth daily.   Yes [provider]  docusate sodium (COLACE) 100 MG capsule Take 100 mg by mouth See admin instructions. 100mg  on Monday and Friday   Yes [provider]  hydrochlorothiazide (HYDRODIURIL) 25 MG tablet Take 25 mg by mouth daily. 01/28/21  Yes [provider]  loperamide (IMODIUM A-D) 2 MG tablet Take 2 mg by mouth every 6 (six) hours as needed for diarrhea or loose stools.   Yes [provider]  Magnesium 250 MG TABS Take 750 mg by mouth daily.   Yes [provider]   melatonin 5 MG TABS Take 5 mg by mouth at bedtime.   Yes [provider]  memantine (NAMENDA) 10 MG tablet Take 1 tablet (10 mg total) by mouth 2 (two) times daily. Please call [phone] to schedule appt. 01/06/21  Yes Marcial Pacas, MD  metoprolol succinate (TOPROL-XL) 50 MG 24 hr tablet Take 1 tablet (50 mg total) by mouth daily. 02/12/21  Yes Freada Bergeron, MD  Multiple Vitamin (MULTIVITAMIN WITH MINERALS) TABS tablet Take 1 tablet by mouth daily.   Yes [provider]  Omega 3 1200 MG CAPS Take 1,200 mg by mouth daily.   Yes [provider]  rosuvastatin (CRESTOR) 10 MG tablet Take 10 mg by mouth daily.   Yes [provider]  valACYclovir (VALTREX) 500 MG tablet Take 500 mg by mouth daily.  07/28/20  Yes [provider]  vitamin B-12 (CYANOCOBALAMIN) 1000 MCG tablet Take 1,000 mcg by mouth daily.   Yes [provider]  nitrofurantoin, macrocrystal-monohydrate, (MACROBID) 100 MG capsule Take 100 mg by mouth 2 (two) times daily. 02/06/21   [provider]  potassium chloride SA (KLOR-CON) 20 MEQ tablet Take 1 tablet (20 mEq total) by mouth daily. Patient not taking: Reported on 05/03/2021 02/05/21   Daleen Bo, MD    Inpatient Medications: Scheduled Meds: . apixaban  5 mg Oral BID  . Chlorhexidine Gluconate Cloth  6 each Topical Daily  . clotrimazole  1 application Topical BID  . mouth rinse  15 mL Mouth Rinse BID  . memantine  10 mg Oral BID  . potassium chloride  20 mEq Oral Once  . rosuvastatin  10 mg Oral q1800  . valACYclovir  500 mg Oral Daily   Continuous Infusions: . ceFEPime (MAXIPIME) IV Stopped (05/05/21 1015)  . dextrose 5 % and 0.9% NaCl 75 mL/hr at 05/05/21 1050  . remdesivir 100 mg in NS 100 mL Stopped (05/05/21 1006)   PRN Meds: acetaminophen **OR** acetaminophen, metoprolol tartrate, ondansetron **OR** ondansetron (ZOFRAN) IV, polyvinyl alcohol  Allergies:    Allergies  Allergen Reactions  .  Amoxicillin Nausea Only  . Ceclor [Cefaclor] Hives  . Levaquin [Levofloxacin] Other (See Comments)  . Nystatin Other (See Comments)    Black scale around teeth.    Social History:   Social History   Socioeconomic History  . Marital status: Married    Spouse name: Not on file  . Number of children: 0  . Years of education: college  . Highest education level: Not on file  Occupational History  . Occupation: Retired Education officer, museum  Tobacco Use  . Smoking status: Former Smoker    Packs/day: 0.50    Types: Cigarettes    Quit date: 12/08/1978    Years since quitting: 42.4  . Smokeless tobacco: Never Used  . Tobacco comment: quit at age 43, 108 PPD  x 10 year history  Vaping Use  . Vaping Use: Never used  Substance and Sexual Activity  . Alcohol use: Never  . Drug use: Yes    Comment: back in the 1980s. hasnt smoked since then  . Sexual activity: Not Currently  Other Topics Concern  . Not on file  Social History Narrative   ** Merged History Encounter **       Lives at home with husband. Right-handed. 2 cups caffeine daily.   Social Determinants of Health   Financial Resource Strain: Not on file  Food Insecurity: Not on file  Transportation Needs: Not on file  Physical Activity: Not on file  Stress: Not on file  Social Connections: Not on file  Intimate Partner Violence: Not on file    Family History:    Family History  Problem Relation Age of Onset  . Stroke Mother   . Cancer Father   . Healthy Brother   . Liver cancer Father   . Hypertension Brother   . Hyperlipidemia Brother   . Rheumatic fever Mother   . Heart disease Mother      ROS:  Please see the history of present illness.   Unable to assess secondary to confusion/dementia  Physical Exam/Data:   Vitals:   05/05/21 0900 05/05/21 1000 05/05/21 1100 05/05/21 1200  BP: 110/63 113/70 117/68 117/62  Pulse: 78 91 (!) 108 (!) 103  Resp: 13 17 17 17   Temp:   (!) 97.4 F (36.3 C)   TempSrc:    Axillary   SpO2: 94% 94% 95% 94%  Weight:        GENERAL:  Chronically ill appearing HEENT:  Pupils equal round and reactive, fundi not visualized, oral mucosa unremarkable NECK:  No jugular venous distention, waveform within normal limits, carotid upstroke brisk and symmetric, no bruits, no thyromegaly LYMPHATICS:  No cervical, inguinal adenopathy LUNGS:  Decreased breath sounds BACK:  No CVA tenderness CHEST:  Unremarkable HEART:  PMI not displaced or sustained,S1 and S2 within normal limits, no S4, no clicks, no rubs, no murmurs, irregular  ABD:  Flat, positive bowel sounds normal in frequency in pitch, no bruits, no rebound, no guarding, no midline pulsatile mass, no hepatomegaly, no splenomegaly EXT:  2 plus pulses throughout, no edema, no cyanosis no clubbing SKIN:  No rashes no nodules NEURO:    Moves all extremities.  PSYCH:  Confused and not oriented to place or time.    Intake/Output Summary (Last 24 hours) at 05/05/2021 1318 Last data filed at 05/05/2021 1050 Gross per 24 hour  Intake 2363.96 ml  Output 1350 ml  Net 1013.96 ml   Last 3 Weights 05/04/2021 05/03/2021 02/05/2021  Weight (lbs) 189 lb 9.5 oz 185 lb 10 oz 170 lb  Weight (kg) 86 kg 84.2 kg 77.111 kg     Body mass index is 30.6 kg/m.  Pt seen and examined by Dr. Percival Spanish.  EKG:  The EKG was personally reviewed and demonstrates:  Sinus rhythm HR 97 LBBB Telemetry:  Telemetry was personally reviewed and demonstrates:  Sinus rhythm --> converted to Afib RVR with PVCs and NSVT with HR 120-140s, primarily in the 120s  Relevant CV Studies:  Echo 09/20/20: 1. Left ventricular ejection fraction, by estimation, is 60 to 65%. The  left ventricle has normal function. The left ventricle has no regional  wall motion abnormalities. There is mild left ventricular hypertrophy.  Left ventricular diastolic parameters  were normal.  2. Right ventricular systolic function is normal.  The right ventricular  size is normal.  Mildly increased right ventricular wall thickness. There  is normal pulmonary artery systolic pressure.  3. The mitral valve is normal in structure. No evidence of mitral valve  regurgitation. No evidence of mitral stenosis.  4. The aortic valve is grossly normal. There is mild calcification of the  aortic valve. Aortic valve regurgitation is not visualized. No aortic  stenosis is present.  5. The inferior vena cava is normal in size with greater than 50%  respiratory variability, suggesting right atrial pressure of 3 mmHg.  6. Increased flow velocities may be secondary to anemia, thyrotoxicosis,  hyperdynamic or high flow state.   Laboratory Data:  High Sensitivity Troponin:  No results for input(s): TROPONINIHS in the last 720 hours.   Chemistry Recent Labs  Lab 05/03/21 1159 05/04/21 0300 05/05/21 0241  NA 136 137 141  K 3.5 4.0 3.4*  CL 92* 105 111  CO2 31 25 23   GLUCOSE 91 68* 88  BUN 42* 32* 27*  CREATININE 1.09* 1.17* 1.12*  CALCIUM 10.3 8.7* 8.2*  GFRNONAA 51* 47* 50*  ANIONGAP 13 7 7     Recent Labs  Lab 05/03/21 1159 05/04/21 0300 05/05/21 0241  PROT 7.3 5.3* 5.2*  ALBUMIN 3.5 2.5* 2.4*  AST 86* 59* 53*  ALT 78* 54* 46*  ALKPHOS 94 75 68  BILITOT 0.1* 0.7 0.6   Hematology Recent Labs  Lab 05/03/21 1159 05/04/21 0300 05/05/21 0241  WBC 7.9 5.8 6.4  RBC 4.95 4.03 4.12  HGB 16.2* 13.2 13.5  HCT 47.7* 39.3 41.0  MCV 96.4 97.5 99.5  MCH 32.7 32.8 32.8  MCHC 34.0 33.6 32.9  RDW 13.6 13.8 14.4  PLT 170 130* 99*   BNPNo results for input(s): BNP, PROBNP in the last 168 hours.  DDimer No results for input(s): DDIMER in the last 168 hours.   Radiology/Studies:  CT Abdomen Pelvis Wo Contrast  Result Date: 05/03/2021 CLINICAL DATA:  80 year old female with history of abdominal pain. EXAM: CT ABDOMEN AND PELVIS WITHOUT CONTRAST TECHNIQUE: Multidetector CT imaging of the abdomen and pelvis was performed following the standard protocol without IV  contrast. COMPARISON:  No priors. FINDINGS: Lower chest: Atherosclerotic calcifications in the thoracic aorta as well as the left main and left anterior descending coronary arteries. Hepatobiliary: Low-attenuation lesions in the central aspect of the liver measuring up to 1.4 cm in diameter, incompletely characterized on today's non-contrast CT examination, but statistically likely to represent cysts. No other larger more suspicious appearing hepatic lesions noted on today's noncontrast CT examination. Several calcified gallstones are noted in the gallbladder measuring up to 1.7 cm in diameter. Pancreas: No definite pancreatic mass or peripancreatic fluid collections or inflammatory changes noted on today's noncontrast CT examination. Spleen: Unremarkable. Adrenals/Urinary Tract: Low-attenuation lesions in both kidneys, incompletely characterized on today's non-contrast CT examination, but statistically likely to represent cysts, largest of which is in the lower pole of the left kidney measuring 8.8 x 5.7 cm. No hydroureteronephrosis. Urinary bladder is normal in appearance. Bilateral adrenal glands are normal in appearance. Stomach/Bowel: Unenhanced appearance of the stomach is normal. No pathologic dilatation of small bowel or colon. Numerous colonic diverticulae are noted, without definite surrounding inflammatory changes to suggest an acute diverticulitis at this time. The appendix is not confidently identified and may be surgically absent. Regardless, there are no inflammatory changes noted adjacent to the cecum to suggest the presence of an acute appendicitis at this time. Vascular/Lymphatic: Aortic atherosclerosis. No lymphadenopathy noted in  the abdomen or pelvis. Reproductive: Uterus is enlarged and heterogeneous in appearance with multiple partially calcified lesions, largest of which is in the posterior aspect of the uterine body measuring 8.5 cm in diameter. Unenhanced appearance of the ovaries is  unremarkable. Other: No significant volume of ascites.  No pneumoperitoneum. Musculoskeletal: There are no aggressive appearing lytic or blastic lesions noted in the visualized portions of the skeleton. IMPRESSION: 1. No acute findings are noted in the abdomen or pelvis to account for the patient's symptoms. 2. Colonic diverticulosis without evidence of acute diverticulitis at this time. 3. Fibroid uterus. 4. Aortic atherosclerosis, as well as left main and left anterior descending coronary artery disease. 5. Additional incidental findings, as above. Electronically Signed   By: Vinnie Langton M.D.   On: 05/03/2021 13:40   CT Head Wo Contrast  Result Date: 05/03/2021 CLINICAL DATA:  AMS EXAM: CT HEAD WITHOUT CONTRAST CT CERVICAL SPINE WITHOUT CONTRAST TECHNIQUE: Multidetector CT imaging of the head and cervical spine was performed following the standard protocol without intravenous contrast. Multiplanar CT image reconstructions of the cervical spine were also generated. COMPARISON:  09/20/20, 02/05/21. FINDINGS: CT HEAD FINDINGS Brain: No evidence of acute infarction, hemorrhage, hydrocephalus, extra-axial collection or mass lesion/mass effect. Remote RIGHT parietal infarction. Periventricular white matter hypodensities consistent with sequela of chronic microvascular ischemic disease. Global parenchymal volume loss. Revisualization of multiple bilateral lacunar infarctions of the basal ganglia. Vascular: Vascular calcifications. Skull: No acute fracture. Sinuses/Orbits: No acute finding. Other: None. CT CERVICAL SPINE FINDINGS Alignment: Unchanged minimal C3-4 anterolisthesis. Straightening of the cervical lordosis. No new spondylolisthesis. Skull base and vertebrae: No acute fracture. Soft tissues and spinal canal: No prevertebral fluid or swelling. No visible canal hematoma. Disc levels: Multilevel severe intervertebral disc space height loss at C4-5, C5-6, C6-7 and C7-T1. Multilevel facet arthropathy and  uncovertebral hypertrophy. Osseous ankylosis of the RIGHT facets of C2-3. Posterior disc osteophyte complex results in severe canal narrowing at C5-6 and C6-7, unchanged in comparison to prior. Multilevel osseous neuroforaminal narrowing, most pronounced at LEFT C5-6 and C6-7. Upper chest: Unchanged LEFT greater than RIGHT apical scarring. Other: Unchanged appearance of multiple thyroid nodules. IMPRESSION: 1.  No acute intracranial abnormality. 2.  No acute fracture or static subluxation of the cervical spine. Electronically Signed   By: Valentino Saxon MD   On: 05/03/2021 11:59   CT Cervical Spine Wo Contrast  Result Date: 05/03/2021 CLINICAL DATA:  AMS EXAM: CT HEAD WITHOUT CONTRAST CT CERVICAL SPINE WITHOUT CONTRAST TECHNIQUE: Multidetector CT imaging of the head and cervical spine was performed following the standard protocol without intravenous contrast. Multiplanar CT image reconstructions of the cervical spine were also generated. COMPARISON:  09/20/20, 02/05/21. FINDINGS: CT HEAD FINDINGS Brain: No evidence of acute infarction, hemorrhage, hydrocephalus, extra-axial collection or mass lesion/mass effect. Remote RIGHT parietal infarction. Periventricular white matter hypodensities consistent with sequela of chronic microvascular ischemic disease. Global parenchymal volume loss. Revisualization of multiple bilateral lacunar infarctions of the basal ganglia. Vascular: Vascular calcifications. Skull: No acute fracture. Sinuses/Orbits: No acute finding. Other: None. CT CERVICAL SPINE FINDINGS Alignment: Unchanged minimal C3-4 anterolisthesis. Straightening of the cervical lordosis. No new spondylolisthesis. Skull base and vertebrae: No acute fracture. Soft tissues and spinal canal: No prevertebral fluid or swelling. No visible canal hematoma. Disc levels: Multilevel severe intervertebral disc space height loss at C4-5, C5-6, C6-7 and C7-T1. Multilevel facet arthropathy and uncovertebral hypertrophy. Osseous  ankylosis of the RIGHT facets of C2-3. Posterior disc osteophyte complex results in severe canal narrowing at C5-6  and C6-7, unchanged in comparison to prior. Multilevel osseous neuroforaminal narrowing, most pronounced at LEFT C5-6 and C6-7. Upper chest: Unchanged LEFT greater than RIGHT apical scarring. Other: Unchanged appearance of multiple thyroid nodules. IMPRESSION: 1.  No acute intracranial abnormality. 2.  No acute fracture or static subluxation of the cervical spine. Electronically Signed   By: Valentino Saxon MD   On: 05/03/2021 11:59   DG Chest Port 1 View  Result Date: 05/03/2021 CLINICAL DATA:  Sepsis EXAM: PORTABLE CHEST 1 VIEW COMPARISON:  September 19, 2020 FINDINGS: The cardiomediastinal silhouette is unchanged in contour.Unchanged elevation of the RIGHT hemidiaphragm. Atherosclerotic calcifications. No pleural effusion. No pneumothorax. No acute pleuroparenchymal abnormality. Visualized abdomen is unremarkable. Multilevel degenerative changes of the thoracic spine. IMPRESSION: No acute cardiopulmonary abnormality. Electronically Signed   By: Valentino Saxon MD   On: 05/03/2021 11:43     Assessment and Plan:   PAF with RVR, LBBB and abb - awaiting 12-lead with rhythm strip - she is NPO except sips and meds. and receiving PRN IV lopressor - given at 1058 today - previously converted on IV cardizem - we can initiate amiodarone IV with bolus with drip - Mg 2.3, K 3.4 - aim to keep K closer to 4.  Potassium supplemented.  - TSH pending   Chronic anticoagulation - continued on eliquis This patients CHA2DS2-VASc Score and unadjusted Ischemic Stroke Rate (% per year) is equal to 11.2 % stroke rate/year from a score of 7 (2age, 2CVA, female, HTN, PAD) - Hb 13.5, PLT 99 - no signs of active bleeding   Hypertension - pressure has been labile - holding home HCTZ, 50 mg toprol   Risk Assessment/Risk Scores:          CHA2DS2-VASc Score = 7  This indicates a 11.2% annual  risk of stroke. The patient's score is based upon: CHF History: No HTN History: Yes Diabetes History: No Stroke History: Yes Vascular Disease History: Yes Age Score: 2 Gender Score: 1         For questions or updates, please contact [place] Please consult [email or web address] for contact info under    Signed, Minus Breeding, MD  05/05/2021 1:18 PM

## 2021-05-05 NOTE — TOC Initial Note (Signed)
Transition of Care [facility]) - Initial/Assessment Note    Patient Details  Name: Jennifer Arroyo MRN: [id number] Date of Birth: [birth year]  [facility] CM/SW Contact:    Leeroy Cha, RN Phone Number: 05/05/2021, 8:11 AM  Clinical Narrative:                 80 y.o. female with medical history significant of dementia, HLD, HTN, a fib, CVA. Presenting after being found down at her facility. History per chart review. She apparently had an unwitnessed fall. No apparent head injury. Facility reports that she had been increasingly agitated recently. They believed it was from a UTI. So, they empirically treated w/ 7 days of macrobid. No cultures sent. Her agitation has not improved. After finding her down this morning, they were concerned. They sent her to the ED for evaluation.     ED Course: She was noted to have an elevated lactic acid. She was hypothermic and tachypneic. She was hypotensive. She was started on sepsis protocol. TRH was called for admission.   Review of Systems:  Unable to obtain d/t mentation. PLAN: from [place].  Plan is to return to ALF may be short term snf for rehab. Expected Discharge Plan: Assisted Living Barriers to Discharge: Continued Medical Work up   Patient Goals and CMS Choice Patient states their goals for this hospitalization and ongoing recovery are:: unable to access due to covid CMS Medicare.gov Compare Post Acute Care list provided to:: Patient    Expected Discharge Plan and Services Expected Discharge Plan: Assisted Living   Discharge Planning Services: CM Consult   Living arrangements for the past 2 months: [place]                                      Prior Living Arrangements/Services Living arrangements for the past 2 months: [place] Lives with:: Spouse Patient language and need for interpreter reviewed:: Yes Do you feel safe going back to the place where you live?: Yes             Criminal Activity/Legal Involvement Pertinent to Current Situation/Hospitalization: No - Comment as needed  Activities of Daily Living Home Assistive Devices/Equipment: Bedside commode/3-in-1 ADL Screening (condition at time of admission) Patient's cognitive ability adequate to safely complete daily activities?: No Is the patient deaf or have difficulty hearing?: No Does the patient have difficulty seeing, even when wearing glasses/contacts?: No Does the patient have difficulty concentrating, remembering, or making decisions?: Yes Patient able to express need for assistance with ADLs?: No Does the patient have difficulty dressing or bathing?: Yes Independently performs ADLs?: No Communication: Appropriate for developmental age Dressing (OT): Needs assistance Is this a change from baseline?: Pre-admission baseline Grooming: Needs assistance Is this a change from baseline?: Pre-admission baseline Feeding: Needs assistance Is this a change from baseline?: Pre-admission baseline Bathing: Needs assistance Is this a change from baseline?: Pre-admission baseline Toileting: Needs assistance Is this a change from baseline?: Pre-admission baseline In/Out Bed: Needs assistance Is this a change from baseline?: Pre-admission baseline Walks in Home: Needs assistance Is this a change from baseline?: Pre-admission baseline Does the patient have difficulty walking or climbing stairs?: Yes Weakness of Legs: None Weakness of Arms/Hands: None  Permission Sought/Granted                  Emotional Assessment Appearance:: Appears stated age Attitude/Demeanor/Rapport: Apprehensive  Affect (typically observed): Agitated Orientation: : Fluctuating Orientation (Suspected and/or reported Sundowners) Alcohol / Substance Use: Not Applicable Psych Involvement: No (comment)  Admission diagnosis:  Sepsis ([place]) [A41.9] Urinary tract infection without hematuria, site unspecified  [N39.0] Sepsis, due to unspecified organism, unspecified whether acute organ dysfunction present [facility]) [A41.9] Patient Active Problem List   Diagnosis Date Noted  . Pressure injury of skin 05/04/2021  . Sepsis ([place]) 05/03/2021  . CVA (cerebral vascular accident) ([place]) 09/19/2020  . New onset a-fib ([place]) 09/19/2020  . HTN (hypertension) 09/19/2020  . Hyperlipidemia 09/19/2020  . Dementia without behavioral disturbance ([place]) 09/19/2020  . Fall at home, initial encounter 09/19/2020  . Bacteria in urine 09/19/2020  . Atrial fibrillation with RVR ([place])   . Memory loss 01/11/2019  . Gait abnormality 01/11/2019   PCP:  Pcp, No Pharmacy:   [id number] - [place], [place] - [address] [place] [address] Phone: [phone] Fax: [phone]     Social Determinants of Health (SDOH) Interventions    Readmission Risk Interventions No flowsheet data found.

## 2021-05-05 NOTE — Evaluation (Signed)
Clinical/Bedside Swallow Evaluation Patient Details  Name: Jennifer Arroyo MRN: [id number] Date of Birth: [birth year]  Today's Date: 05/05/2021 Time: SLP Start Time (ACUTE ONLY): 21 SLP Stop Time (ACUTE ONLY): 1600 SLP Time Calculation (min) (ACUTE ONLY): 20 min  Past Medical History:  Past Medical History:  Diagnosis Date  . Bilateral bunions    hammer toe  . Cervical os stenosis   . CVA (cerebral vascular accident) ([place])   . Gallstones   . Hemangioma of liver    probable  . Hemorrhoids   . Hyperlipemia   . Hypertension   . Insomnia   . Left bundle branch block   . Left bundle branch block   . Low back pain   . Memory loss   . Plantar fasciitis   . Rectocele   . Renal cyst   . Stroke ([place]) 01/2018   Past Surgical History:  Past Surgical History:  Procedure Laterality Date  . APPENDECTOMY    . COLONOSCOPY    . TONSILLECTOMY     HPI:  Pt is an 80 yo pt with dementia, brough in after being found down at her facility.  Per Md notes, pt potentially had a fall.  No apparent head injury.  Facility reports she had been increasingly agitated recently and ?'d UTI.  She was treated for UTI but agitation did not improve.  PMH + for hyperlipidemia, hypertension, A. 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  She is found to be COVID+ and is started on remdesivir.  Imaging studies negative.  Pt has not undergone prior SLP swallow evaluation.  Pt seen by neurology - note reviewed from 2018 - pt with progressive memory deficits at that time and sopuse desired tp to have people come in to help take care of her.  No family present at this time.   Assessment / Plan / Recommendation Clinical Impression  Pt's largest barrier to po intake at this time appears to be her mentation likely due to her COVID and UTI. She did not readily accept po intake as she sealed her lips tightly closed.  Voice is mildly decresed  strength but clear and no obvious focal CN deficits apparent despite h/o right parietal lobe CVA.  Pt eventually accepted a few boluses of apple juice thin and nectar thick via straw.  Immediate cough post-swallow of thin applejuice - concerning for spillage into airway prior to swallow.  Nectar thick juice was tolerated without indication of aspiration.  HR varied during session but pt did not appear uncomfortable.  She was pleasant, frequently smiling and asking questions re: cats.  At this time, only recommend pt have floor stock of nectar thick, medications with puree as toelrated and single ice chips. will follow up next date for further po trials hopefully with improved pt participation.   Of note, pt does not appear cachetic and suspect her cognitive based dysphagia is medically, metabolic driven and will improve. SLP Visit Diagnosis: Dysphagia, unspecified (R13.10)    Aspiration Risk  Risk for inadequate nutrition/hydration;Moderate aspiration risk    Diet Recommendation Other (Comment) (nectar floor stock)   Liquid Administration via: Cup;Straw Medication Administration: Whole meds with puree Supervision: Staff to assist with self feeding Compensations: Slow rate;Small sips/bites    Other  Recommendations Oral Care Recommendations: Oral care QID   Follow up Recommendations Skilled Nursing facility      Frequency and Duration min 1 x/week  1  week       Prognosis Prognosis for Safe Diet Advancement: Fair Barriers to Reach Goals: Cognitive deficits      Swallow Study   General Date of Onset: 05/05/21 HPI: Pt is an 80 yo pt with dementia, brough in after being found down at her facility.  Per Md notes, pt potentially had a fall.  No apparent head injury.  Facility reports she had been increasingly agitated recently and ?'d UTI.  She was treated for UTI but agitation did not improve.  PMH + for hyperlipidemia, hypertension, A. 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  She is found to be COVID+ and is started on remdesivir.  Imaging studies negative.  Pt has not undergone prior SLP swallow evaluation.  Pt seen by neurology - note reviewed from 2018 - pt with progressive memory deficits at that time and sopuse desired tp to have people come in to help take care of her.  No family present at this time. Type of Study: Bedside Swallow Evaluation Previous Swallow Assessment: none in epic Diet Prior to this Study: NPO (except medications) Temperature Spikes Noted: No Respiratory Status: Nasal cannula History of Recent Intubation: No Behavior/Cognition: Alert;Doesn't follow directions;Distractible Oral Cavity Assessment: Within Functional Limits Oral Care Completed by SLP: No (pt did not allow) Oral Cavity - Dentition: Adequate natural dentition Vision: Impaired for self-feeding Self-Feeding Abilities: Total assist;Refused PO Patient Positioning: Upright in bed Baseline Vocal Quality: Low vocal intensity Volitional Cough: Cognitively unable to elicit Volitional Swallow: Unable to elicit    Oral/Motor/Sensory Function Overall Oral Motor/Sensory Function: Other (comment) (pt did not follow directions but able to seal lips tightly to decline po intake)   Ice Chips Other Comments: did not accept   Thin Liquid Thin Liquid: Impaired Presentation: Straw Pharyngeal  Phase Impairments: Suspected delayed Swallow;Cough - Immediate Other Comments: concern for aspiration due to premature spillage into airway before swallow    Nectar Thick Nectar Thick Liquid: Impaired Pharyngeal Phase Impairments: Suspected delayed Swallow   Honey Thick Honey Thick Liquid: Not tested   Puree Other Comments: pt declined to consume plain applesauce, despite multiple attempts   Solid     Solid: Not tested  Pt refusing nearly all po offerings     Macario Golds 05/05/2021,4:15 PM   Kathleen Lime, MS [place] Office [phone] Pager [phone]

## 2021-05-05 NOTE — Progress Notes (Signed)
PROGRESS NOTE    Jennifer Arroyo  JOI:[id number] DOB: 17-Nov-1941 DOA: 05/03/2021 PCP: Pcp, No    Brief Narrative:  This 80 years old female with PMH significant for dementia, hyperlipidemia, hypertension, A. fib, CVA brought in the ED after being found down at her facility.  History per chart review.  She apparently had a unwitnessed fall.  No apparent head injury.  Facility reports she had been increasingly agitated recently, they believed it could be from a UTI so she was empirically treated for 7 days with Macrobid,  no cultures were sent.  Her agitation has not improved. she was sent in the hospital. She was hypothermic, hypotensive,  tachypneic and has elevated lactic acid.  Patient is started on sepsis protocol. She is found to be COVID+ and started on remdesivir given high fevers.  Assessment & Plan:   Active Problems:   Sepsis ([place])   Pressure injury of skin  Severe Sepsis secondary to UTI: Patient was sent from SNF after being found down. She was hypotensive, tachypneic, hypothermic with elevated lactic acid. She has received fluid boluses as per sepsis protocol. UA : LE+, Blood cultures no growth so far, urine culture contaminated. Started on empiric antibiotics vancomycin, cefepime and Flagyl. Procalcitonin normal, lactic acid 3.3.  MRSA nares negative. Lactic acid normalized to 1.8 after fluid resuscitation. CT abdomen and pelvis no acute abnormality.  Diverticulosis without diverticulitis. Antibiotic de-escalated to cefepime only. Continue to monitor clinical status.  Acute on chronic metabolic encephalopathy > improving AMS could be secondary to UTI versus COVID. CT head negative for any acute abnormality. She was COVID-positive 3 weeks ago,  COVID test +, continue supportive care. Started remdesivir for 3-day treatment. She is not hypoxic,  will hold on steroids. Neurochecks every 4-6 hours. She is alert and oriented today.  She is much improved.  COVID+: She is  not hypoxic, not requiring oxygen. Continue remdesivir 3-day treatment. Continue isolation precautions.  Hypertension: Hold BP meds for now as she is on the hypotensive side. Consider resuming metoprolol.  HLD Continue statin  Dementia Continue home medications.   Atrial fibrillation with RVR: Blood pressure has slightly improved. Continue metoprolol 5 mg IV push every 6 as needed. Cardiology consulted, awaiting recommendation. Continue Eliquis for anticoagulation.   Goals of care discussion.: Husband was notified about poor prognosis. Palliative care consulted to discuss goals of care.   DVT prophylaxis: Eliquis Code Status:DNR Family Communication: Spoke with husband. Disposition Plan:    Status is: Inpatient  Remains inpatient appropriate because:Inpatient level of care appropriate due to severity of illness   Dispo: The patient is from: Home              Anticipated d/c is to: Home              Patient currently is not medically stable to d/c.   Difficult to place patient No  Consultants:   None.  Procedures:  Antimicrobials:   Anti-infectives (From admission, onward)   Start     Dose/Rate Route Frequency Ordered Stop   05/05/21 1000  remdesivir 100 mg in sodium chloride 0.9 % 100 mL IVPB       "Followed by" Linked Group Details   100 mg 200 mL/hr over 30 Minutes Intravenous Daily 05/04/21 1141 05/07/21 0959   05/04/21 1230  remdesivir 200 mg in sodium chloride 0.9% 250 mL IVPB       "Followed by" Linked Group Details   200 mg 580 mL/hr over 30 Minutes Intravenous  Once 05/04/21 1141 05/04/21 1258   05/04/21 1000  valACYclovir (VALTREX) tablet 500 mg        500 mg Oral Daily 05/03/21 1717     05/03/21 2200  metroNIDAZOLE (FLAGYL) IVPB 500 mg  Status:  Discontinued        500 mg 100 mL/hr over 60 Minutes Intravenous Every 8 hours 05/03/21 1717 05/04/21 1141   05/03/21 2200  ceFEPIme (MAXIPIME) 2 g in sodium chloride 0.9 % 100 mL IVPB        2  g 200 mL/hr over 30 Minutes Intravenous Every 12 hours 05/03/21 1554     05/03/21 2200  vancomycin (VANCOCIN) IVPB 1000 mg/200 mL premix  Status:  Discontinued        1,000 mg 200 mL/hr over 60 Minutes Intravenous Every 24 hours 05/03/21 1554 05/04/21 1141   05/03/21 1345  aztreonam (AZACTAM) 2 g in sodium chloride 0.9 % 100 mL IVPB        2 g 200 mL/hr over 30 Minutes Intravenous  Once 05/03/21 1332 05/03/21 1430   05/03/21 1345  metroNIDAZOLE (FLAGYL) IVPB 500 mg        500 mg 100 mL/hr over 60 Minutes Intravenous  Once 05/03/21 1332 05/03/21 1503   05/03/21 1345  vancomycin (VANCOCIN) IVPB 1000 mg/200 mL premix        1,000 mg 200 mL/hr over 60 Minutes Intravenous  Once 05/03/21 1332 05/03/21 1502      Subjective: Patient was seen and examined at bedside.  Overnight events noted.   She is more alert and oriented x2.  She is following commands.  It seems like this is her baseline mental status. Blood pressure has improved but heart rate remains elevated between 110-130. She denies any chest pain or shortness of breath or dizziness or palpitations.   Objective: Vitals:   05/05/21 0749 05/05/21 0800 05/05/21 0900 05/05/21 1000  BP:  (!) 142/71 110/63 113/70  Pulse:  98 78 91  Resp:  (!) 24 13 17   Temp: (!) 97.5 F (36.4 C)     TempSrc: Axillary     SpO2:  95% 94% 94%  Weight:        Intake/Output Summary (Last 24 hours) at 05/05/2021 1111 Last data filed at 05/05/2021 1050 Gross per 24 hour  Intake 3188.78 ml  Output 1350 ml  Net 1838.78 ml   Filed Weights   05/03/21 1700 05/04/21 0400  Weight: 84.2 kg 86 kg    Examination:  General exam: Appears calm and comfortable, alert and oriented and following commands. Respiratory system: Clear to auscultation. Respiratory effort normal. Cardiovascular system: S1 & S2 heard, Irregular rhythm. No JVD, murmurs, rubs, gallops or clicks. No pedal edema. Gastrointestinal system: Abdomen is nondistended, soft and nontender. No  organomegaly or masses felt. Normal bowel sounds heard. Central nervous system: Alert and oriented x 2, following commands. Extremities: No edema, no cyanosis, no clubbing. Skin: No rashes, lesions or ulcers Psychiatry: Judgement and insight appear normal. Mood & affect appropriate.     Data Reviewed: I have personally reviewed following labs and imaging studies  CBC: Recent Labs  Lab 05/03/21 1159 05/04/21 0300 05/05/21 0241  WBC 7.9 5.8 6.4  NEUTROABS 5.9  --   --   HGB 16.2* 13.2 13.5  HCT 47.7* 39.3 41.0  MCV 96.4 97.5 99.5  PLT 170 130* 99*   Basic Metabolic Panel: Recent Labs  Lab 05/03/21 1159 05/04/21 0300 05/05/21 0241  NA 136 137 141  K 3.5  4.0 3.4*  CL 92* 105 111  CO2 31 25 23   GLUCOSE 91 68* 88  BUN 42* 32* 27*  CREATININE 1.09* 1.17* 1.12*  CALCIUM 10.3 8.7* 8.2*  MG  --  2.7* 2.3  PHOS  --  2.4* 2.6   GFR: Estimated Creatinine Clearance: 44.3 mL/min (A) (by C-G formula based on SCr of 1.12 mg/dL (H)). Liver Function Tests: Recent Labs  Lab 05/03/21 1159 05/04/21 0300 05/05/21 0241  AST 86* 59* 53*  ALT 78* 54* 46*  ALKPHOS 94 75 68  BILITOT 0.1* 0.7 0.6  PROT 7.3 5.3* 5.2*  ALBUMIN 3.5 2.5* 2.4*   No results for input(s): LIPASE, AMYLASE in the last 168 hours. No results for input(s): AMMONIA in the last 168 hours. Coagulation Profile: Recent Labs  Lab 05/03/21 1159 05/04/21 0300  INR 1.3* 1.4*   Cardiac Enzymes: No results for input(s): CKTOTAL, CKMB, CKMBINDEX, TROPONINI in the last 168 hours. BNP (last 3 results) No results for input(s): PROBNP in the last 8760 hours. HbA1C: No results for input(s): HGBA1C in the last 72 hours. CBG: Recent Labs  Lab 05/03/21 1853 05/03/21 2354 05/04/21 0346 05/04/21 0414 05/04/21 1148  GLUCAP 110* 113* 65* 139* 92   Lipid Profile: No results for input(s): CHOL, HDL, LDLCALC, TRIG, CHOLHDL, LDLDIRECT in the last 72 hours. Thyroid Function Tests: No results for input(s): TSH, T4TOTAL,  FREET4, T3FREE, THYROIDAB in the last 72 hours. Anemia Panel: No results for input(s): VITAMINB12, FOLATE, FERRITIN, TIBC, IRON, RETICCTPCT in the last 72 hours. Sepsis Labs: Recent Labs  Lab 05/03/21 1159 05/04/21 0300  PROCALCITON  --  <0.10  LATICACIDVEN 1.8  3.3*  --     Recent Results (from the past 240 hour(s))  Urine culture     Status: Abnormal   Collection Time: 05/03/21 11:27 AM   Specimen: Urine, Catheterized  Result Value Ref Range Status   Specimen Description   Final    URINE, CATHETERIZED Performed at [facility] [address] [place], [place] [address]    Special Requests   Final    NONE Performed at [facility], [place] [address], [place], [place] [address]    Culture MULTIPLE SPECIES PRESENT, SUGGEST RECOLLECTION (A)  Final   Report Status 05/05/2021 FINAL  Final  Culture, blood (routine x 2)     Status: None (Preliminary result)   Collection Time: 05/03/21 11:32 AM   Specimen: BLOOD  Result Value Ref Range Status   Specimen Description   Final    BLOOD SITE NOT SPECIFIED Performed at [facility] [address] [place], [place] [address]    Special Requests   Final    BOTTLES DRAWN AEROBIC AND ANAEROBIC Blood Culture results may not be optimal due to an inadequate volume of blood received in culture bottles Performed at [facility] [address], [place], [place] [address]    Culture   Final    NO GROWTH 2 DAYS Performed at [facility] [address], [place], [place] [address]    Report Status PENDING  Incomplete  Culture, blood (routine x 2)     Status: None (Preliminary result)   Collection Time: 05/03/21 11:59 AM   Specimen: BLOOD  Result Value Ref Range Status   Specimen Description   Final    BLOOD SITE NOT SPECIFIED Performed at [facility] [address] [place], [place] [address]    Special Requests   Final  BOTTLES DRAWN  AEROBIC ONLY Blood Culture adequate volume Performed at [facility] [address], [place], [place] [address]    Culture   Final    NO GROWTH 2 DAYS Performed at [facility] [address], [place], [place] [address]    Report Status PENDING  Incomplete  Resp Panel by RT-PCR (Flu A&B, Covid) Nasopharyngeal Swab     Status: Abnormal   Collection Time: 05/03/21  2:30 PM   Specimen: Nasopharyngeal Swab; Nasopharyngeal(NP) swabs in vial transport medium  Result Value Ref Range Status   SARS Coronavirus 2 by RT PCR POSITIVE (A) NEGATIVE Final    Comment: RESULT CALLED TO, READ BACK BY AND VERIFIED WITH: M,HAILEY AT 1614 ON 05/03/21 BY A,MOHAMED (NOTE) SARS-CoV-2 target nucleic acids are DETECTED.  The SARS-CoV-2 RNA is generally detectable in upper respiratory specimens during the acute phase of infection. Positive results are indicative of the presence of the identified virus, but do not rule out bacterial infection or co-infection with other pathogens not detected by the test. Clinical correlation with patient history and other diagnostic information is necessary to determine patient infection status. The expected result is Negative.  Fact Sheet for Patients: EntrepreneurPulse.com.au  Fact Sheet for Healthcare Providers: IncredibleEmployment.be  This test is not yet approved or cleared by the [place] FDA and  has been authorized for detection and/or diagnosis of SARS-CoV-2 by FDA under an Emergency Use Authorization (EUA).  This EUA will remain in effect (meaning this test  can be used) for the duration of  the COVID-19 declaration under Section 564(b)(1) of the Act, 21 U.S.C. section 360bbb-3(b)(1), unless the authorization is terminated or revoked sooner.     Influenza A by PCR NEGATIVE NEGATIVE Final   Influenza B by PCR NEGATIVE NEGATIVE Final    Comment: (NOTE) The Xpert Xpress SARS-CoV-2/FLU/RSV plus  assay is intended as an aid in the diagnosis of influenza from Nasopharyngeal swab specimens and should not be used as a sole basis for treatment. Nasal washings and aspirates are unacceptable for Xpert Xpress SARS-CoV-2/FLU/RSV testing.  Fact Sheet for Patients: EntrepreneurPulse.com.au  Fact Sheet for Healthcare Providers: IncredibleEmployment.be  This test is not yet approved or cleared by the [place] FDA and has been authorized for detection and/or diagnosis of SARS-CoV-2 by FDA under an Emergency Use Authorization (EUA). This EUA will remain in effect (meaning this test can be used) for the duration of the COVID-19 declaration under Section 564(b)(1) of the Act, 21 U.S.C. section 360bbb-3(b)(1), unless the authorization is terminated or revoked.  Performed at [facility], [place] [address], [place], [place] [address]   MRSA PCR Screening     Status: None   Collection Time: 05/03/21  6:18 PM   Specimen: Nasal Mucosa; Nasopharyngeal  Result Value Ref Range Status   MRSA by PCR NEGATIVE NEGATIVE Final    Comment:        The GeneXpert MRSA Assay (FDA approved for NASAL specimens only), is one component of a comprehensive MRSA colonization surveillance program. It is not intended to diagnose MRSA infection nor to guide or monitor treatment for MRSA infections. Performed at [facility], [place] [address] [place],  [address]     Radiology Studies: CT Abdomen Pelvis Wo Contrast  Result Date: 05/03/2021 CLINICAL DATA:  80 year old female with history of abdominal pain. EXAM: CT ABDOMEN AND PELVIS WITHOUT CONTRAST TECHNIQUE: Multidetector CT imaging of the abdomen and pelvis was performed following the standard protocol without  IV contrast. COMPARISON:  No priors. FINDINGS: Lower chest: Atherosclerotic calcifications in the thoracic aorta as well as the left main and left anterior descending  coronary arteries. Hepatobiliary: Low-attenuation lesions in the central aspect of the liver measuring up to 1.4 cm in diameter, incompletely characterized on today's non-contrast CT examination, but statistically likely to represent cysts. No other larger more suspicious appearing hepatic lesions noted on today's noncontrast CT examination. Several calcified gallstones are noted in the gallbladder measuring up to 1.7 cm in diameter. Pancreas: No definite pancreatic mass or peripancreatic fluid collections or inflammatory changes noted on today's noncontrast CT examination. Spleen: Unremarkable. Adrenals/Urinary Tract: Low-attenuation lesions in both kidneys, incompletely characterized on today's non-contrast CT examination, but statistically likely to represent cysts, largest of which is in the lower pole of the left kidney measuring 8.8 x 5.7 cm. No hydroureteronephrosis. Urinary bladder is normal in appearance. Bilateral adrenal glands are normal in appearance. Stomach/Bowel: Unenhanced appearance of the stomach is normal. No pathologic dilatation of small bowel or colon. Numerous colonic diverticulae are noted, without definite surrounding inflammatory changes to suggest an acute diverticulitis at this time. The appendix is not confidently identified and may be surgically absent. Regardless, there are no inflammatory changes noted adjacent to the cecum to suggest the presence of an acute appendicitis at this time. Vascular/Lymphatic: Aortic atherosclerosis. No lymphadenopathy noted in the abdomen or pelvis. Reproductive: Uterus is enlarged and heterogeneous in appearance with multiple partially calcified lesions, largest of which is in the posterior aspect of the uterine body measuring 8.5 cm in diameter. Unenhanced appearance of the ovaries is unremarkable. Other: No significant volume of ascites.  No pneumoperitoneum. Musculoskeletal: There are no aggressive appearing lytic or blastic lesions noted in the  visualized portions of the skeleton. IMPRESSION: 1. No acute findings are noted in the abdomen or pelvis to account for the patient's symptoms. 2. Colonic diverticulosis without evidence of acute diverticulitis at this time. 3. Fibroid uterus. 4. Aortic atherosclerosis, as well as left main and left anterior descending coronary artery disease. 5. Additional incidental findings, as above. Electronically Signed   By: Vinnie Langton M.D.   On: 05/03/2021 13:40   CT Head Wo Contrast  Result Date: 05/03/2021 CLINICAL DATA:  AMS EXAM: CT HEAD WITHOUT CONTRAST CT CERVICAL SPINE WITHOUT CONTRAST TECHNIQUE: Multidetector CT imaging of the head and cervical spine was performed following the standard protocol without intravenous contrast. Multiplanar CT image reconstructions of the cervical spine were also generated. COMPARISON:  09/20/20, 02/05/21. FINDINGS: CT HEAD FINDINGS Brain: No evidence of acute infarction, hemorrhage, hydrocephalus, extra-axial collection or mass lesion/mass effect. Remote RIGHT parietal infarction. Periventricular white matter hypodensities consistent with sequela of chronic microvascular ischemic disease. Global parenchymal volume loss. Revisualization of multiple bilateral lacunar infarctions of the basal ganglia. Vascular: Vascular calcifications. Skull: No acute fracture. Sinuses/Orbits: No acute finding. Other: None. CT CERVICAL SPINE FINDINGS Alignment: Unchanged minimal C3-4 anterolisthesis. Straightening of the cervical lordosis. No new spondylolisthesis. Skull base and vertebrae: No acute fracture. Soft tissues and spinal canal: No prevertebral fluid or swelling. No visible canal hematoma. Disc levels: Multilevel severe intervertebral disc space height loss at C4-5, C5-6, C6-7 and C7-T1. Multilevel facet arthropathy and uncovertebral hypertrophy. Osseous ankylosis of the RIGHT facets of C2-3. Posterior disc osteophyte complex results in severe canal narrowing at C5-6 and C6-7, unchanged  in comparison to prior. Multilevel osseous neuroforaminal narrowing, most pronounced at LEFT C5-6 and C6-7. Upper chest: Unchanged LEFT greater than RIGHT apical scarring. Other: Unchanged appearance of multiple thyroid  nodules. IMPRESSION: 1.  No acute intracranial abnormality. 2.  No acute fracture or static subluxation of the cervical spine. Electronically Signed   By: Valentino Saxon MD   On: 05/03/2021 11:59   CT Cervical Spine Wo Contrast  Result Date: 05/03/2021 CLINICAL DATA:  AMS EXAM: CT HEAD WITHOUT CONTRAST CT CERVICAL SPINE WITHOUT CONTRAST TECHNIQUE: Multidetector CT imaging of the head and cervical spine was performed following the standard protocol without intravenous contrast. Multiplanar CT image reconstructions of the cervical spine were also generated. COMPARISON:  09/20/20, 02/05/21. FINDINGS: CT HEAD FINDINGS Brain: No evidence of acute infarction, hemorrhage, hydrocephalus, extra-axial collection or mass lesion/mass effect. Remote RIGHT parietal infarction. Periventricular white matter hypodensities consistent with sequela of chronic microvascular ischemic disease. Global parenchymal volume loss. Revisualization of multiple bilateral lacunar infarctions of the basal ganglia. Vascular: Vascular calcifications. Skull: No acute fracture. Sinuses/Orbits: No acute finding. Other: None. CT CERVICAL SPINE FINDINGS Alignment: Unchanged minimal C3-4 anterolisthesis. Straightening of the cervical lordosis. No new spondylolisthesis. Skull base and vertebrae: No acute fracture. Soft tissues and spinal canal: No prevertebral fluid or swelling. No visible canal hematoma. Disc levels: Multilevel severe intervertebral disc space height loss at C4-5, C5-6, C6-7 and C7-T1. Multilevel facet arthropathy and uncovertebral hypertrophy. Osseous ankylosis of the RIGHT facets of C2-3. Posterior disc osteophyte complex results in severe canal narrowing at C5-6 and C6-7, unchanged in comparison to prior. Multilevel  osseous neuroforaminal narrowing, most pronounced at LEFT C5-6 and C6-7. Upper chest: Unchanged LEFT greater than RIGHT apical scarring. Other: Unchanged appearance of multiple thyroid nodules. IMPRESSION: 1.  No acute intracranial abnormality. 2.  No acute fracture or static subluxation of the cervical spine. Electronically Signed   By: Valentino Saxon MD   On: 05/03/2021 11:59   DG Chest Port 1 View  Result Date: 05/03/2021 CLINICAL DATA:  Sepsis EXAM: PORTABLE CHEST 1 VIEW COMPARISON:  September 19, 2020 FINDINGS: The cardiomediastinal silhouette is unchanged in contour.Unchanged elevation of the RIGHT hemidiaphragm. Atherosclerotic calcifications. No pleural effusion. No pneumothorax. No acute pleuroparenchymal abnormality. Visualized abdomen is unremarkable. Multilevel degenerative changes of the thoracic spine. IMPRESSION: No acute cardiopulmonary abnormality. Electronically Signed   By: Valentino Saxon MD   On: 05/03/2021 11:43   Scheduled Meds: . apixaban  5 mg Oral BID  . Chlorhexidine Gluconate Cloth  6 each Topical Daily  . clotrimazole  1 application Topical BID  . mouth rinse  15 mL Mouth Rinse BID  . memantine  10 mg Oral BID  . rosuvastatin  10 mg Oral q1800  . valACYclovir  500 mg Oral Daily   Continuous Infusions: . ceFEPime (MAXIPIME) IV Stopped (05/05/21 1015)  . dextrose 5 % and 0.9% NaCl 75 mL/hr at 05/05/21 1050  . remdesivir 100 mg in NS 100 mL Stopped (05/05/21 1006)     LOS: 2 days    Time spent: 35 mins    Chayanne Speir, MD [facility]   If 7PM-7AM, please contact night-coverage

## 2021-05-06 DIAGNOSIS — I480 Paroxysmal atrial fibrillation: Secondary | ICD-10-CM | POA: Diagnosis not present

## 2021-05-06 DIAGNOSIS — I472 Ventricular tachycardia: Secondary | ICD-10-CM | POA: Diagnosis not present

## 2021-05-06 LAB — BASIC METABOLIC PANEL
Anion gap: 5 (ref 5–15)
BUN: 23 mg/dL (ref 8–23)
CO2: 21 mmol/L — ABNORMAL LOW (ref 22–32)
Calcium: 8.4 mg/dL — ABNORMAL LOW (ref 8.9–10.3)
Chloride: 116 mmol/L — ABNORMAL HIGH (ref 98–111)
Creatinine, Ser: 1.11 mg/dL — ABNORMAL HIGH (ref 0.44–1.00)
GFR, Estimated: 50 mL/min — ABNORMAL LOW (ref >60)
Glucose, Bld: 104 mg/dL — ABNORMAL HIGH (ref 70–99)
Potassium: 3.6 mmol/L (ref 3.5–5.1)
Sodium: 142 mmol/L (ref 135–145)

## 2021-05-06 LAB — PHOSPHORUS: Phosphorus: 2.1 mg/dL — ABNORMAL LOW (ref 2.5–4.6)

## 2021-05-06 LAB — MAGNESIUM: Magnesium: 2.2 mg/dL (ref 1.7–2.4)

## 2021-05-06 MED ORDER — POTASSIUM PHOSPHATES 15 MMOLE/5ML IV SOLN
30.0000 mmol | Freq: Once | INTRAVENOUS | Status: AC
  Administered 2021-05-06: 30 mmol via INTRAVENOUS
  Filled 2021-05-06: qty 10

## 2021-05-06 NOTE — Progress Notes (Signed)
Progress Note  Patient Name: Jennifer Arroyo Date of Encounter: 05/06/2021  [facility] Cardiologist: Freada Bergeron, MD   Subjective   The patient is minimally responsive.  Not able to answer questions.    Inpatient Medications    Scheduled Meds: . apixaban  5 mg Oral BID  . Chlorhexidine Gluconate Cloth  6 each Topical Daily  . clotrimazole  1 application Topical BID  . mouth rinse  15 mL Mouth Rinse BID  . memantine  10 mg Oral BID  . rosuvastatin  10 mg Oral q1800  . valACYclovir  500 mg Oral Daily   Continuous Infusions: . amiodarone Stopped (05/05/21 2119)  . ceFEPime (MAXIPIME) IV 2 g (05/06/21 0949)  . dextrose 5 % and 0.9% NaCl 75 mL/hr at 05/06/21 0400  . potassium PHOSPHATE IVPB (in mmol)    . remdesivir 100 mg in NS 100 mL Stopped (05/05/21 1006)   PRN Meds: acetaminophen **OR** acetaminophen, metoprolol tartrate, ondansetron **OR** ondansetron (ZOFRAN) IV, polyvinyl alcohol   Vital Signs    Vitals:   05/06/21 0300 05/06/21 0400 05/06/21 0500 05/06/21 0740  BP: 140/67 (!) 149/96 (!) 156/92   Pulse: 69 74 73   Resp: 18 15 17    Temp:    97.8 F (36.6 C)  TempSrc:    Axillary  SpO2: 94% 93% 95%   Weight:        Intake/Output Summary (Last 24 hours) at 05/06/2021 1149 Last data filed at 05/06/2021 0651 Gross per 24 hour  Intake 1485.37 ml  Output 900 ml  Net 585.37 ml   Last 3 Weights 05/04/2021 05/03/2021 02/05/2021  Weight (lbs) 189 lb 9.5 oz 185 lb 10 oz 170 lb  Weight (kg) 86 kg 84.2 kg 77.111 kg      Telemetry    NSR, wide complex tachycardia regular  - Personally Reviewed  ECG    NA - Personally Reviewed  Physical Exam   GEN: Chronically and acutely ill appearing  Neck: No JVD Cardiac: RRR, no murmurs, rubs, or gallops.  Respiratory: Clear to auscultation bilaterally. GI: Soft, nontender, non-distended  MS:   Mild non pitting edema; No deformity. Neuro:  Unable to assess Psych:   Unable to answer questions. Not opening eyes.    Labs    High Sensitivity Troponin:  No results for input(s): TROPONINIHS in the last 720 hours.    Chemistry Recent Labs  Lab 05/03/21 1159 05/04/21 0300 05/05/21 0241 05/06/21 0256  NA 136 137 141 142  K 3.5 4.0 3.4* 3.6  CL 92* 105 111 116*  CO2 31 25 23  21*  GLUCOSE 91 68* 88 104*  BUN 42* 32* 27* 23  CREATININE 1.09* 1.17* 1.12* 1.11*  CALCIUM 10.3 8.7* 8.2* 8.4*  PROT 7.3 5.3* 5.2*  --   ALBUMIN 3.5 2.5* 2.4*  --   AST 86* 59* 53*  --   ALT 78* 54* 46*  --   ALKPHOS 94 75 68  --   BILITOT 0.1* 0.7 0.6  --   GFRNONAA 51* 47* 50* 50*  ANIONGAP 13 7 7 5      Hematology Recent Labs  Lab 05/03/21 1159 05/04/21 0300 05/05/21 0241  WBC 7.9 5.8 6.4  RBC 4.95 4.03 4.12  HGB 16.2* 13.2 13.5  HCT 47.7* 39.3 41.0  MCV 96.4 97.5 99.5  MCH 32.7 32.8 32.8  MCHC 34.0 33.6 32.9  RDW 13.6 13.8 14.4  PLT 170 130* 99*    BNPNo results for input(s): BNP, PROBNP  in the last 168 hours.   DDimer No results for input(s): DDIMER in the last 168 hours.   Radiology    No results found.  Cardiac Studies     Patient Profile     80 y.o. 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  - previously converted on cardizem gtt - amiodarone gtt started yesterday and she converted to sinus rhythm with bigeminy at approximately 2018 yesterday evening with a 4 sec post-conversion pause - she continues to have ectopy with PVCs, bigeminy, and NSVT vs afib RVR - difficult to tell, but does have periods of sinus rhythm - continue amiodarone gtt - can continue metoprolol    Chronic anticoagulation - continue eliquis for CHA2DS2-VASc Score and unadjusted Ischemic Stroke Rate (% per year) is equal to 11.2 % stroke rate/year from a score of 7 (2age, 2CVA, female, HTN, PAD)   Hypertension - pressures continue to be labile - holdin home HCTZ - consider resuming 25 mg toprol       For questions or updates, please contact [facility] Please consult [email or web address] for contact info under        Signed, Ledora Bottcher, PA  05/06/2021, 11:49 AM     History and all data above reviewed.  Patient examined.  I agree with the findings as above.  The patient exam reveals  (I completed and recorded the exam above.)  All available labs, radiology testing, previous records reviewed. Agree with documented assessment and plan.   The patient is now in NSR with runs of probable NSVT.  I would continue the amiodarone again today.    Jeneen Rinks Dajion Bickford  12:50 PM  05/06/2021

## 2021-05-06 NOTE — Progress Notes (Signed)
SLP Cancellation Note  Patient Details Name: Jennifer Arroyo MRN: [id number] DOB: 07-Jun-1941   Cancelled treatment:        Pt has not been appropriate for full po diet today.  Minimally responsive per cardiology notes and RN reports pt with variable HR - being medically managed.  Will reattempt 05/07/2021.    Kathleen Lime, MS [facility] SLP Acute Rehab Services Office [phone] Pager [phone]     Macario Golds 05/06/2021, 7:16 PM

## 2021-05-06 NOTE — Progress Notes (Signed)
[facility] paged about patient heart pauses and bradycardic events. RN turned amiodarone gtt off and informed [facility]. Called patient husband to be by her side if an unfornate event may happen tonight. RN will continue to monitor.

## 2021-05-06 NOTE — Progress Notes (Signed)
PROGRESS NOTE    KAMA CAMMARANO  PPJ:[id number] DOB: Jul 12, 1941 DOA: 05/03/2021 PCP: Pcp, No    Brief Narrative:  This 80 years old female with PMH significant for dementia, hyperlipidemia, hypertension, A. fib, CVA brought in the ED after being found down at her facility.  History per chart review.  She apparently had a unwitnessed fall.  No apparent head injury.  Facility reports she had been increasingly agitated recently, they believed it could be from a UTI so she was empirically treated for 7 days with Macrobid,  no cultures were sent.  Her agitation has not improved. she was sent in the hospital. She was hypothermic, hypotensive,  tachypneic and has elevated lactic acid.  Patient is started on sepsis protocol. She is found to be COVID+ and started on remdesivir given high fevers.  Assessment & Plan:   Active Problems:   Sepsis ([place])   Pressure injury of skin  Severe Sepsis secondary to UTI: Patient was sent from SNF after being found down. She was hypotensive, tachypneic, hypothermic with elevated lactic acid. She has received fluid boluses as per sepsis protocol. UA : LE+, Blood cultures no growth so far, urine culture contaminated. Started on empiric antibiotics vancomycin, cefepime and Flagyl. Procalcitonin normal, lactic acid 3.3.  MRSA nares negative. Lactic acid normalized to 1.8 after fluid resuscitation. CT abdomen and pelvis no acute abnormality.  Diverticulosis without diverticulitis. Antibiotic de-escalated to cefepime only. Continue to monitor clinical status.  Acute on chronic metabolic encephalopathy > improving AMS could be secondary to UTI and COVID. CT head negative for any acute abnormality. She was COVID-positive 3 weeks ago,  COVID test +, continue supportive care. Continue remdesivir for 3-day treatment. She is not hypoxic,  will hold on steroids. Neurochecks every 4-6 hours. Patient is alert and oriented x 1.   COVID+: She is not hypoxic, not  requiring oxygen. Continue remdesivir 3-day treatment. Continue isolation precautions.  Hypertension: Continue metoprolol.  HLD Continue statin  Dementia Continue home medications.   Atrial fibrillation with RVR: Blood pressure has slightly improved. Continue metoprolol 5 mg IV push every 6 as needed. Cardiology consulted, recommended amiodarone infusion. Continue Eliquis for anticoagulation.   Goals of care discussion.: Husband was notified about poor prognosis. Palliative care consulted to discuss goals of care. Patient is DNR but wants to continue current scope of care.   DVT prophylaxis: Eliquis Code Status:DNR Family Communication: Spoke with husband. Disposition Plan:    Status is: Inpatient  Remains inpatient appropriate because:Inpatient level of care appropriate due to severity of illness   Dispo: The patient is from: Home              Anticipated d/c is to: Home              Patient currently is not medically stable to d/c.   Difficult to place patient No  Consultants:   None.  Procedures:  Antimicrobials:   Anti-infectives (From admission, onward)   Start     Dose/Rate Route Frequency Ordered Stop   05/05/21 1000  remdesivir 100 mg in sodium chloride 0.9 % 100 mL IVPB       "Followed by" Linked Group Details   100 mg 200 mL/hr over 30 Minutes Intravenous Daily 05/04/21 1141 05/07/21 0959   05/04/21 1230  remdesivir 200 mg in sodium chloride 0.9% 250 mL IVPB       "Followed by" Linked Group Details   200 mg 580 mL/hr over 30 Minutes Intravenous Once 05/04/21 1141 05/04/21  1258   05/04/21 1000  valACYclovir (VALTREX) tablet 500 mg        500 mg Oral Daily 05/03/21 1717     05/03/21 2200  metroNIDAZOLE (FLAGYL) IVPB 500 mg  Status:  Discontinued        500 mg 100 mL/hr over 60 Minutes Intravenous Every 8 hours 05/03/21 1717 05/04/21 1141   05/03/21 2200  ceFEPIme (MAXIPIME) 2 g in sodium chloride 0.9 % 100 mL IVPB        2 g 200 mL/hr over  30 Minutes Intravenous Every 12 hours 05/03/21 1554     05/03/21 2200  vancomycin (VANCOCIN) IVPB 1000 mg/200 mL premix  Status:  Discontinued        1,000 mg 200 mL/hr over 60 Minutes Intravenous Every 24 hours 05/03/21 1554 05/04/21 1141   05/03/21 1345  aztreonam (AZACTAM) 2 g in sodium chloride 0.9 % 100 mL IVPB        2 g 200 mL/hr over 30 Minutes Intravenous  Once 05/03/21 1332 05/03/21 1430   05/03/21 1345  metroNIDAZOLE (FLAGYL) IVPB 500 mg        500 mg 100 mL/hr over 60 Minutes Intravenous  Once 05/03/21 1332 05/03/21 1503   05/03/21 1345  vancomycin (VANCOCIN) IVPB 1000 mg/200 mL premix        1,000 mg 200 mL/hr over 60 Minutes Intravenous  Once 05/03/21 1332 05/03/21 1502      Subjective: Patient was seen and examined at bedside.  Overnight events noted.  Amiodarone was kept on hold briefly due to the pauses on telemetry. She is following partial commands. It seems like this is her baseline mental status. She denies any chest pain or shortness of breath or dizziness or palpitations.   Objective: Vitals:   05/06/21 0400 05/06/21 0500 05/06/21 0740 05/06/21 1300  BP: (!) 149/96 (!) 156/92    Pulse: 74 73    Resp: 15 17    Temp:   97.8 F (36.6 C) 98.6 F (37 C)  TempSrc:   Axillary Axillary  SpO2: 93% 95%    Weight:        Intake/Output Summary (Last 24 hours) at 05/06/2021 1352 Last data filed at 05/06/2021 0651 Gross per 24 hour  Intake 1485.37 ml  Output 900 ml  Net 585.37 ml   Filed Weights   05/03/21 1700 05/04/21 0400  Weight: 84.2 kg 86 kg    Examination:  General exam: Appears calm and comfortable, alert and oriented x1 and following commands. Respiratory system: Clear to auscultation. Respiratory effort normal. Cardiovascular system: S1 & S2 heard, Irregular rhythm. No JVD, murmurs, rubs, gallops or clicks. No pedal edema. Gastrointestinal system: Abdomen is nondistended, soft and nontender. No organomegaly or masses felt. Normal bowel sounds  heard. Central nervous system: Alert and oriented x 1, following commands. Extremities: No edema, no cyanosis, no clubbing. Skin: No rashes, lesions or ulcers Psychiatry: Judgement and insight appear normal. Mood & affect appropriate.     Data Reviewed: I have personally reviewed following labs and imaging studies  CBC: Recent Labs  Lab 05/03/21 1159 05/04/21 0300 05/05/21 0241  WBC 7.9 5.8 6.4  NEUTROABS 5.9  --   --   HGB 16.2* 13.2 13.5  HCT 47.7* 39.3 41.0  MCV 96.4 97.5 99.5  PLT 170 130* 99*   Basic Metabolic Panel: Recent Labs  Lab 05/03/21 1159 05/04/21 0300 05/05/21 0241 05/06/21 0256  NA 136 137 141 142  K 3.5 4.0 3.4* 3.6  CL 92*  105 111 116*  CO2 31 25 23  21*  GLUCOSE 91 68* 88 104*  BUN 42* 32* 27* 23  CREATININE 1.09* 1.17* 1.12* 1.11*  CALCIUM 10.3 8.7* 8.2* 8.4*  MG  --  2.7* 2.3 2.2  PHOS  --  2.4* 2.6 2.1*   GFR: Estimated Creatinine Clearance: 44.7 mL/min (A) (by C-G formula based on SCr of 1.11 mg/dL (H)). Liver Function Tests: Recent Labs  Lab 05/03/21 1159 05/04/21 0300 05/05/21 0241  AST 86* 59* 53*  ALT 78* 54* 46*  ALKPHOS 94 75 68  BILITOT 0.1* 0.7 0.6  PROT 7.3 5.3* 5.2*  ALBUMIN 3.5 2.5* 2.4*   No results for input(s): LIPASE, AMYLASE in the last 168 hours. No results for input(s): AMMONIA in the last 168 hours. Coagulation Profile: Recent Labs  Lab 05/03/21 1159 05/04/21 0300  INR 1.3* 1.4*   Cardiac Enzymes: No results for input(s): CKTOTAL, CKMB, CKMBINDEX, TROPONINI in the last 168 hours. BNP (last 3 results) No results for input(s): PROBNP in the last 8760 hours. HbA1C: No results for input(s): HGBA1C in the last 72 hours. CBG: Recent Labs  Lab 05/03/21 2354 05/04/21 0346 05/04/21 0414 05/04/21 1148 05/05/21 1223  GLUCAP 113* 65* 139* 92 94   Lipid Profile: No results for input(s): CHOL, HDL, LDLCALC, TRIG, CHOLHDL, LDLDIRECT in the last 72 hours. Thyroid Function Tests: Recent Labs    05/05/21 1444   TSH 1.205   Anemia Panel: No results for input(s): VITAMINB12, FOLATE, FERRITIN, TIBC, IRON, RETICCTPCT in the last 72 hours. Sepsis Labs: Recent Labs  Lab 05/03/21 1159 05/04/21 0300  PROCALCITON  --  <0.10  LATICACIDVEN 1.8  3.3*  --     Recent Results (from the past 240 hour(s))  Urine culture     Status: Abnormal   Collection Time: 05/03/21 11:27 AM   Specimen: Urine, Catheterized  Result Value Ref Range Status   Specimen Description   Final    URINE, CATHETERIZED Performed at [facility] [address] [place], [place] [address]    Special Requests   Final    NONE Performed at [facility], [place] [address] [place], [place] [address]    Culture MULTIPLE SPECIES PRESENT, SUGGEST RECOLLECTION (A)  Final   Report Status 05/05/2021 FINAL  Final  Culture, blood (routine x 2)     Status: None (Preliminary result)   Collection Time: 05/03/21 11:32 AM   Specimen: BLOOD  Result Value Ref Range Status   Specimen Description   Final    BLOOD SITE NOT SPECIFIED Performed at [facility] [address] [place], [place] [address]    Special Requests   Final    BOTTLES DRAWN AEROBIC AND ANAEROBIC Blood Culture results may not be optimal due to an inadequate volume of blood received in culture bottles Performed at [facility] [address] [place], [place] [address]    Culture   Final    NO GROWTH 3 DAYS Performed at [facility], [place] [address] [place], [place] [address]    Report Status PENDING  Incomplete  Culture, blood (routine x 2)     Status: None (Preliminary result)   Collection Time: 05/03/21 11:59 AM   Specimen: BLOOD  Result Value Ref Range Status   Specimen Description   Final    BLOOD SITE NOT SPECIFIED Performed at [facility] [address] [place], [place] [address]    Special Requests  Final    BOTTLES DRAWN AEROBIC ONLY Blood Culture  adequate volume Performed at [facility] [address] [place], [place] [address]    Culture   Final    NO GROWTH 3 DAYS Performed at [facility], [place] [address] [place], [place] [address]    Report Status PENDING  Incomplete  Resp Panel by RT-PCR (Flu A&B, Covid) Nasopharyngeal Swab     Status: Abnormal   Collection Time: 05/03/21  2:30 PM   Specimen: Nasopharyngeal Swab; Nasopharyngeal(NP) swabs in vial transport medium  Result Value Ref Range Status   SARS Coronavirus 2 by RT PCR POSITIVE (A) NEGATIVE Final    Comment: RESULT CALLED TO, READ BACK BY AND VERIFIED WITH: M,HAILEY AT 1614 ON 05/03/21 BY A,MOHAMED (NOTE) SARS-CoV-2 target nucleic acids are DETECTED.  The SARS-CoV-2 RNA is generally detectable in upper respiratory specimens during the acute phase of infection. Positive results are indicative of the presence of the identified virus, but do not rule out bacterial infection or co-infection with other pathogens not detected by the test. Clinical correlation with patient history and other diagnostic information is necessary to determine patient infection status. The expected result is Negative.  Fact Sheet for Patients: EntrepreneurPulse.com.au  Fact Sheet for Healthcare Providers: IncredibleEmployment.be  This test is not yet approved or cleared by the [place] FDA and  has been authorized for detection and/or diagnosis of SARS-CoV-2 by FDA under an Emergency Use Authorization (EUA).  This EUA will remain in effect (meaning this test  can be used) for the duration of  the COVID-19 declaration under Section 564(b)(1) of the Act, 21 U.S.C. section 360bbb-3(b)(1), unless the authorization is terminated or revoked sooner.     Influenza A by PCR NEGATIVE NEGATIVE Final   Influenza B by PCR NEGATIVE NEGATIVE Final    Comment: (NOTE) The Xpert Xpress SARS-CoV-2/FLU/RSV plus assay is intended as an  aid in the diagnosis of influenza from Nasopharyngeal swab specimens and should not be used as a sole basis for treatment. Nasal washings and aspirates are unacceptable for Xpert Xpress SARS-CoV-2/FLU/RSV testing.  Fact Sheet for Patients: EntrepreneurPulse.com.au  Fact Sheet for Healthcare Providers: IncredibleEmployment.be  This test is not yet approved or cleared by the [place] FDA and has been authorized for detection and/or diagnosis of SARS-CoV-2 by FDA under an Emergency Use Authorization (EUA). This EUA will remain in effect (meaning this test can be used) for the duration of the COVID-19 declaration under Section 564(b)(1) of the Act, 21 U.S.C. section 360bbb-3(b)(1), unless the authorization is terminated or revoked.  Performed at [facility], [place] [address], [place], [place] [address]   MRSA PCR Screening     Status: None   Collection Time: 05/03/21  6:18 PM   Specimen: Nasal Mucosa; Nasopharyngeal  Result Value Ref Range Status   MRSA by PCR NEGATIVE NEGATIVE Final    Comment:        The GeneXpert MRSA Assay (FDA approved for NASAL specimens only), is one component of a comprehensive MRSA colonization surveillance program. It is not intended to diagnose MRSA infection nor to guide or monitor treatment for MRSA infections. Performed at [facility], [place] [address], [place], [place] [address]     Radiology Studies: No results found. Scheduled Meds: . apixaban  5 mg Oral BID  . Chlorhexidine Gluconate Cloth  6 each Topical Daily  . clotrimazole  1 application Topical BID  . mouth rinse  15 mL Mouth  Rinse BID  . memantine  10 mg Oral BID  . rosuvastatin  10 mg Oral q1800  . valACYclovir  500 mg Oral Daily   Continuous Infusions: . amiodarone Stopped (05/05/21 2119)  . ceFEPime (MAXIPIME) IV 2 g (05/06/21 0949)  . dextrose 5 % and 0.9% NaCl 75 mL/hr at 05/06/21 0400  .  potassium PHOSPHATE IVPB (in mmol)    . remdesivir 100 mg in NS 100 mL Stopped (05/05/21 1006)     LOS: 3 days    Time spent: 35 mins    Lamarion Mcevers, MD [facility]   If 7PM-7AM, please contact night-coverage

## 2021-05-06 NOTE — Consult Note (Signed)
Consultation Note Date: 05/06/2021   Patient Name: Jennifer Arroyo  DOB: Aug 26, 1941  MRN: [id number]  Age / Sex: 80 y.o., female  PCP: Pcp, No Referring Physician: Shawna Clamp, MD  Reason for Consultation: Establishing goals of care  HPI/Patient Profile: 80 y.o. female  with past medical history of dementia, hyperlipidemia, hypertension, A. fib, CVA, resident long-term assisted living admitted on 05/03/2021 with fall and altered mental status.  Work-up revealed concern for UTI as well as positive COVID.  Her encephalopathy worsened yesterday but she appears to be improved today.  She has also had A. fib with RVR.  Palliative consulted for goals of care.  Clinical Assessment and Goals of Care: I saw and examined Jennifer Arroyo today.  She was awake and alert but is confused and has no insight into her medical condition.  She answers some simple questions but is not oriented.  I called and spoke with her husband via phone.  I introduced palliative care as specialized medical care for people living with serious illness. It focuses on providing relief from the symptoms and stress of a serious illness. The goal is to improve quality of life for both the patient and the family.  We discussed her clinical course this admission as well as wishes moving forward.  He understands that she is very ill and we discussed continued decline she has been having over the past several months to years.  Discussed changes in nutrition, cognition, and functional status and how this acutely got worse over the past couple weeks.  Discussed how this could be related to UTI, but could also be sequelae from [place].  Discussed concerns about potential dysphagia as well as her being able to maintain enough intake to sustain herself.  Her husband expressed concern that she may need higher level care at ALF time of discharge.  As she appears to  be doing better than documented yesterday, we discussed plan to continue with current interventions and reassess her situation again over the next couple of days.  SUMMARY OF RECOMMENDATIONS   -DNR -Continue current interventions.  Her husband understands concerns about dysphagia as well as nutrition moving forward.  He would like to see how she continues to progress in these domains over the next couple of days and we will continue to discuss long-term goals of care based upon likely prognosis related to these. -He expressed being concerned that she may not be able to be cared for at assisted living.  We discussed plan to see how she progresses over the next couple of days and if she returns to prior baseline. -Palliative care to continue to follow.  Code Status/Advance Care Planning:  DNR  Prognosis:   Guarded  Discharge Planning: To Be Determined      Primary Diagnoses: Present on Admission: . Sepsis ([place])   I have reviewed the medical record, interviewed the patient and family, and examined the patient. The following aspects are pertinent.  Past Medical History:  Diagnosis Date  . Bilateral bunions  hammer toe  . Cervical os stenosis   . CVA (cerebral vascular accident) ([place])   . Gallstones   . Hemangioma of liver    probable  . Hemorrhoids   . Hyperlipemia   . Hypertension   . Insomnia   . Left bundle branch block   . Left bundle branch block   . Low back pain   . Memory loss   . Plantar fasciitis   . Rectocele   . Renal cyst   . Stroke ([place]) 01/2018   Social History   Socioeconomic History  . Marital status: Married    Spouse name: Not on file  . Number of children: 0  . Years of education: college  . Highest education level: Not on file  Occupational History  . Occupation: Retired Education officer, museum  Tobacco Use  . Smoking status: Former Smoker    Packs/day: 0.50    Types: Cigarettes    Quit date: 12/08/1978    Years since quitting: 42.4  .  Smokeless tobacco: Never Used  . Tobacco comment: quit at age 26, 1 PPD x 10 year history  Vaping Use  . Vaping Use: Never used  Substance and Sexual Activity  . Alcohol use: Never  . Drug use: Yes    Comment: back in the 1980s. hasnt smoked since then  . Sexual activity: Not Currently  Other Topics Concern  . Not on file  Social History Narrative   ** Merged History Encounter **       Lives at home with husband. Right-handed. 2 cups caffeine daily.   Social Determinants of Health   Financial Resource Strain: Not on file  Food Insecurity: Not on file  Transportation Needs: Not on file  Physical Activity: Not on file  Stress: Not on file  Social Connections: Not on file   Family History  Problem Relation Age of Onset  . Stroke Mother   . Cancer Father   . Healthy Brother   . Liver cancer Father   . Hypertension Brother   . Hyperlipidemia Brother   . Rheumatic fever Mother   . Heart disease Mother    Scheduled Meds: . apixaban  5 mg Oral BID  . Chlorhexidine Gluconate Cloth  6 each Topical Daily  . clotrimazole  1 application Topical BID  . mouth rinse  15 mL Mouth Rinse BID  . memantine  10 mg Oral BID  . rosuvastatin  10 mg Oral q1800  . valACYclovir  500 mg Oral Daily   Continuous Infusions: . amiodarone Stopped (05/05/21 2119)  . ceFEPime (MAXIPIME) IV 5 mL/hr at 05/06/21 0400  . dextrose 5 % and 0.9% NaCl 75 mL/hr at 05/06/21 0400  . remdesivir 100 mg in NS 100 mL Stopped (05/05/21 1006)   PRN Meds:.acetaminophen **OR** acetaminophen, metoprolol tartrate, ondansetron **OR** ondansetron (ZOFRAN) IV, polyvinyl alcohol Medications Prior to Admission:  Prior to Admission medications   Medication Sig Start Date End Date Taking? Authorizing Provider  apixaban (ELIQUIS) 5 MG TABS tablet Take 1 tablet (5 mg total) by mouth 2 (two) times daily. 11/17/20  Yes Freada Bergeron, MD  Biotin 2500 MCG CAPS Take 2,500 mg by mouth daily.   Yes [provider]   Calcium Carb-Cholecalciferol (CALCIUM-VITAMIN D3) 600-200 MG-UNIT TABS Take 1 tablet by mouth daily.   Yes [provider]  carboxymethylcellulose 1 % ophthalmic solution Place 2 drops into both eyes daily.   Yes [provider]  Cholecalciferol (VITAMIN D) 50 MCG (2000 UT) tablet  Take 2,000 Units by mouth daily.   Yes [provider]  clotrimazole (LOTRIMIN) 1 % cream Apply 1 application topically 2 (two) times daily. Apply to abdominal folds topically two times daily for yeast   Yes [provider]  Coenzyme Q10 (CO Q-10) 100 MG CAPS Take 100 mg by mouth daily.    Yes [provider]  Cyanocobalamin 1000 MCG TBCR Take 1,000 mcg by mouth daily.   Yes [provider]  docusate sodium (COLACE) 100 MG capsule Take 100 mg by mouth See admin instructions. 100mg  on Monday and Friday   Yes [provider]  hydrochlorothiazide (HYDRODIURIL) 25 MG tablet Take 25 mg by mouth daily. 01/28/21  Yes [provider]  loperamide (IMODIUM A-D) 2 MG tablet Take 2 mg by mouth every 6 (six) hours as needed for diarrhea or loose stools.   Yes [provider]  Magnesium 250 MG TABS Take 750 mg by mouth daily.   Yes [provider]  melatonin 5 MG TABS Take 5 mg by mouth at bedtime.   Yes [provider]  memantine (NAMENDA) 10 MG tablet Take 1 tablet (10 mg total) by mouth 2 (two) times daily. Please call [phone] to schedule appt. 01/06/21  Yes Marcial Pacas, MD  metoprolol succinate (TOPROL-XL) 50 MG 24 hr tablet Take 1 tablet (50 mg total) by mouth daily. 02/12/21  Yes Freada Bergeron, MD  Multiple Vitamin (MULTIVITAMIN WITH MINERALS) TABS tablet Take 1 tablet by mouth daily.   Yes [provider]  Omega 3 1200 MG CAPS Take 1,200 mg by mouth daily.   Yes [provider]  rosuvastatin (CRESTOR) 10 MG tablet Take 10 mg by mouth daily.   Yes [provider]  valACYclovir (VALTREX) 500 MG tablet  Take 500 mg by mouth daily.  07/28/20  Yes [provider]  vitamin B-12 (CYANOCOBALAMIN) 1000 MCG tablet Take 1,000 mcg by mouth daily.   Yes [provider]  nitrofurantoin, macrocrystal-monohydrate, (MACROBID) 100 MG capsule Take 100 mg by mouth 2 (two) times daily. 02/06/21   [provider]  potassium chloride SA (KLOR-CON) 20 MEQ tablet Take 1 tablet (20 mEq total) by mouth daily. Patient not taking: Reported on 05/03/2021 02/05/21   Daleen Bo, MD   Allergies  Allergen Reactions  . Amoxicillin Nausea Only  . Ceclor [Cefaclor] Hives  . Levaquin [Levofloxacin] Other (See Comments)  . Nystatin Other (See Comments)    Black scale around teeth.   Review of Systems  Unable to obtain secondary to confusion  Physical Exam  General: Alert, awake, in no acute distress.  HEENT: No bruits, no goiter, no JVD Heart: Irregular.  Tachycardic.Marland Kitchen No murmur appreciated. Lungs: Decreased air movement, clear Abdomen: Soft, nontender, nondistended, positive bowel sounds.  Ext: No significant edema Skin: Warm and dry Neuro: Moves 4 extremities.  No focal deficits noted.   Vital Signs: BP (!) 156/92   Pulse 73   Temp (!) 97.5 F (36.4 C) (Axillary)   Resp 17   Wt 86 kg   SpO2 95%   BMI 30.60 kg/m  Pain Scale: 0-10   Pain Score: Asleep   SpO2: SpO2: 95 % O2 Device:SpO2: 95 % O2 Flow Rate: .O2 Flow Rate (L/min): 3 L/min  IO: Intake/output summary:   Intake/Output Summary (Last 24 hours) at 05/06/2021 0732 Last data filed at 05/06/2021 0651 Gross per 24 hour  Intake 2645.13 ml  Output 1400 ml  Net 1245.13 ml    LBM: Last  BM Date:  (PTA) Baseline Weight: Weight: 84.2 kg Most recent weight: Weight: 86 kg     Palliative Assessment/Data:   Flowsheet Rows   Flowsheet Row Most Recent Value  Intake Tab   Referral Department Hospitalist  Unit at Time of Referral Med/Surg Unit  Palliative Care Primary Diagnosis Sepsis/Infectious Disease  Date Notified  05/04/21  Reason for referral Clarify Goals of Care  Date of Admission 05/03/21  Date first seen by Palliative Care 05/05/21  # of days Palliative referral response time 1 Day(s)  # of days IP prior to Palliative referral 1  Clinical Assessment   Palliative Performance Scale Score 30%  Psychosocial & Spiritual Assessment   Palliative Care Outcomes   Patient/Family meeting held? Yes  Who was at the meeting? Husband via phone  Palliative Care Outcomes Clarified goals of care      Time In: 1710 Time Out: 1810 Time Total: 60 Greater than 50%  of this time was spent counseling and coordinating care related to the above assessment and plan.  Signed by: Micheline Rough, MD   Please contact Palliative Medicine Team phone at [phone] for questions and concerns.  For individual provider: See Shea Evans

## 2021-05-07 DIAGNOSIS — R413 Other amnesia: Secondary | ICD-10-CM

## 2021-05-07 DIAGNOSIS — I10 Essential (primary) hypertension: Secondary | ICD-10-CM

## 2021-05-07 DIAGNOSIS — E669 Obesity, unspecified: Secondary | ICD-10-CM

## 2021-05-07 DIAGNOSIS — E6609 Other obesity due to excess calories: Secondary | ICD-10-CM

## 2021-05-07 DIAGNOSIS — A419 Sepsis, unspecified organism: Secondary | ICD-10-CM

## 2021-05-07 DIAGNOSIS — E785 Hyperlipidemia, unspecified: Secondary | ICD-10-CM

## 2021-05-07 DIAGNOSIS — N390 Urinary tract infection, site not specified: Secondary | ICD-10-CM

## 2021-05-07 LAB — GLUCOSE, CAPILLARY
Glucose-Capillary: 117 mg/dL — ABNORMAL HIGH (ref 70–99)
Glucose-Capillary: 64 mg/dL — ABNORMAL LOW (ref 70–99)
Glucose-Capillary: 70 mg/dL (ref 70–99)
Glucose-Capillary: 92 mg/dL (ref 70–99)

## 2021-05-07 LAB — BASIC METABOLIC PANEL
Anion gap: 8 (ref 5–15)
BUN: 19 mg/dL (ref 8–23)
CO2: 19 mmol/L — ABNORMAL LOW (ref 22–32)
Calcium: 8.4 mg/dL — ABNORMAL LOW (ref 8.9–10.3)
Chloride: 115 mmol/L — ABNORMAL HIGH (ref 98–111)
Creatinine, Ser: 1.14 mg/dL — ABNORMAL HIGH (ref 0.44–1.00)
GFR, Estimated: 49 mL/min — ABNORMAL LOW (ref >60)
Glucose, Bld: 94 mg/dL (ref 70–99)
Potassium: 4.7 mmol/L (ref 3.5–5.1)
Sodium: 142 mmol/L (ref 135–145)

## 2021-05-07 LAB — CBC
HCT: 39.6 % (ref 36.0–46.0)
Hemoglobin: 13.6 g/dL (ref 12.0–15.0)
MCH: 33.1 pg (ref 26.0–34.0)
MCHC: 34.3 g/dL (ref 30.0–36.0)
MCV: 96.4 fL (ref 80.0–100.0)
Platelets: 96 10*3/uL — ABNORMAL LOW (ref 150–400)
RBC: 4.11 MIL/uL (ref 3.87–5.11)
RDW: 14.6 % (ref 11.5–15.5)
WBC: 7.8 10*3/uL (ref 4.0–10.5)
nRBC: 0 % (ref 0.0–0.2)

## 2021-05-07 LAB — PHOSPHORUS: Phosphorus: 3.4 mg/dL (ref 2.5–4.6)

## 2021-05-07 LAB — MAGNESIUM: Magnesium: 1.8 mg/dL (ref 1.7–2.4)

## 2021-05-07 MED ORDER — MAGNESIUM SULFATE 2 GM/50ML IV SOLN
2.0000 g | Freq: Once | INTRAVENOUS | Status: AC
  Administered 2021-05-07: 2 g via INTRAVENOUS
  Filled 2021-05-07: qty 50

## 2021-05-07 MED ORDER — METOPROLOL TARTRATE 5 MG/5ML IV SOLN
5.0000 mg | INTRAVENOUS | Status: DC | PRN
  Administered 2021-05-10: 5 mg via INTRAVENOUS
  Filled 2021-05-07: qty 5

## 2021-05-07 MED ORDER — METOPROLOL TARTRATE 5 MG/5ML IV SOLN
5.0000 mg | INTRAVENOUS | Status: DC | PRN
  Administered 2021-05-07: 5 mg via INTRAVENOUS
  Filled 2021-05-07: qty 5

## 2021-05-07 NOTE — Progress Notes (Signed)
Daily Progress Note   Patient Name: Jennifer Arroyo       Date: 05/07/2021 DOB: 10-16-1941  Age: 80 y.o. MRN#: [id number] Attending Physician: Tawni Millers Primary Care Physician: Pcp, No Admit Date: 05/03/2021  Reason for Consultation/Follow-up: Establishing goals of care  Subjective: I saw and examined Ms. Pringle today.  She is sleepier and did not really interact with me today.  I called and spoke with her husband.  He tells me that he has been at the hospital last 2 nights after she has had arrhythmia.  We talked about continued concern regarding her multiple comorbidities including mental status that is precluding her from having nutrition and hydration.  Discussed that I am concerned that this may be a recoverable illness and he expressed understanding.  I shared that I did not think invasive interventions, such as consideration for feeding tube, would be helpful when he agreed.  Discussed plan for meeting tomorrow around 10 AM to discuss overall clinical course and next steps.  Length of Stay: 4  Current Medications: Scheduled Meds:  . apixaban  5 mg Oral BID  . Chlorhexidine Gluconate Cloth  6 each Topical Daily  . clotrimazole  1 application Topical BID  . mouth rinse  15 mL Mouth Rinse BID  . memantine  10 mg Oral BID  . rosuvastatin  10 mg Oral q1800  . valACYclovir  500 mg Oral Daily    Continuous Infusions: . amiodarone 30 mg/hr (05/07/21 0857)  . magnesium sulfate bolus IVPB      PRN Meds: acetaminophen **OR** acetaminophen, metoprolol tartrate, metoprolol tartrate, ondansetron **OR** ondansetron (ZOFRAN) IV, polyvinyl alcohol  Physical Exam         General:  Sleepy. HEENT: No bruits, no goiter, no JVD Heart: Irregular.  Tachycardic.Marland Kitchen No murmur  appreciated. Lungs: Decreased air movement, clear Abdomen: Soft, nontender, nondistended, positive bowel sounds.  Ext: No significant edema Skin: Warm and dry Neuro: Moves 4 extremities.  No focal deficits noted.  Vital Signs: BP (!) 117/57   Pulse 100   Temp 98.9 F (37.2 C) (Axillary)   Resp (!) 21   Wt 86 kg   SpO2 92%   BMI 30.60 kg/m  SpO2: SpO2: 92 % O2 Device: O2 Device: Nasal Cannula O2 Flow Rate: O2 Flow Rate (L/min): 3 L/min  Intake/output  summary:   Intake/Output Summary (Last 24 hours) at 05/07/2021 1745 Last data filed at 05/07/2021 0600 Gross per 24 hour  Intake 1104.02 ml  Output 900 ml  Net 204.02 ml   LBM: Last BM Date:  (PTA) Baseline Weight: Weight: 84.2 kg Most recent weight: Weight: 86 kg       Palliative Assessment/Data:    Flowsheet Rows   Flowsheet Row Most Recent Value  Intake Tab   Referral Department Hospitalist  Unit at Time of Referral Med/Surg Unit  Palliative Care Primary Diagnosis Sepsis/Infectious Disease  Date Notified 05/04/21  Reason for referral Clarify Goals of Care  Date of Admission 05/03/21  Date first seen by Palliative Care 05/05/21  # of days Palliative referral response time 1 Day(s)  # of days IP prior to Palliative referral 1  Clinical Assessment   Palliative Performance Scale Score 30%  Psychosocial & Spiritual Assessment   Palliative Care Outcomes   Patient/Family meeting held? Yes  Who was at the meeting? Husband via phone  Palliative Care Outcomes Clarified goals of care      Patient Active Problem List   Diagnosis Date Noted  . Sepsis secondary to UTI ([place]) 05/07/2021  . Obesity 05/07/2021  . Pressure injury of skin 05/04/2021  . Sepsis ([place]) 05/03/2021  . CVA (cerebral vascular accident) ([place]) 09/19/2020  . New onset a-fib ([place]) 09/19/2020  . HTN (hypertension) 09/19/2020  . Hyperlipidemia 09/19/2020  . Dementia without behavioral disturbance ([place]) 09/19/2020  . Fall at home, initial encounter  09/19/2020  . Bacteria in urine 09/19/2020  . Atrial fibrillation with RVR ([place])   . Memory loss 01/11/2019  . Gait abnormality 01/11/2019    Palliative Care Assessment & Plan   Patient Profile: 80 y.o. female  with past medical history of dementia, hyperlipidemia, hypertension, A. fib, CVA, resident long-term assisted living admitted on 05/03/2021 with fall and altered mental status.  Work-up revealed concern for UTI as well as positive COVID.  Her encephalopathy worsened yesterday but she appears to be improved today.  She has also had A. fib with RVR.  Palliative consulted for goals of care.  Recommendations/Plan: Continue current interventions.   Discussed concern that her mental status is not improving and she has not able to maintain nutrition and hydration. He is clear in agreement that feeding tube would not be appropriate in her situation. We discussed plan to meet tomorrow at 10 AM to reassess her situation and discuss goals of care moving forward.  Goals of Care and Additional Recommendations: Limitations on Scope of Treatment: Full Scope Treatment  Code Status:    Code Status Orders  (From admission, onward)         Start     Ordered   05/04/21 0923  Do not attempt resuscitation (DNR)  Continuous       Question Answer Comment  In the event of cardiac or respiratory ARREST Do not call a "code blue"   In the event of cardiac or respiratory ARREST Do not perform Intubation, CPR, defibrillation or ACLS   In the event of cardiac or respiratory ARREST Use medication by any route, position, wound care, and other measures to relive pain and suffering. May use oxygen, suction and manual treatment of airway obstruction as needed for comfort.      05/04/21 7322        Code Status History    Date Active Date Inactive Code Status Order ID Comments User Context   05/03/2021 1717 05/04/2021 0254 Full  Code [id number]  Jonnie Finner, DO Inpatient   09/19/2020 1937 09/22/2020 1746  DNR [id number]  Eugenie Filler, MD Inpatient   Advance Care Planning Activity    Advance Directive Documentation   Flowsheet Row Most Recent Value  Type of Advance Directive Out of facility DNR (pink MOST or yellow form)  Pre-existing out of facility DNR order (yellow form or pink MOST form) Yellow form placed in chart (order not valid for inpatient use)  "MOST" Form in Place? --      Prognosis:  Guarded/poor  Discharge Planning: To Be Determined  Care plan was discussed with husband  Thank you for allowing the Palliative Medicine Team to assist in the care of this patient.   Time In: 1655 Time Out: 1720 Total Time 25 Prolonged Time Billed No   Greater than 50%  of this time was spent counseling and coordinating care related to the above assessment and plan.  Micheline Rough, MD  Please contact Palliative Medicine Team phone at [phone] for questions and concerns.

## 2021-05-07 NOTE — Progress Notes (Addendum)
PROGRESS NOTE    Jennifer Arroyo  JSH:[id number] DOB: 09-03-1941 DOA: 05/03/2021 PCP: Pcp, No    Brief Narrative:  Jennifer Arroyo was admitted to the hospital with working diagnosis of severe sepsis secondary to urinary tract infection, endorgan failure acute metabolic encephalopathy (present on admission).  80 year old female past medical history for dementia, dyslipidemia, hypertension, history of CVA and atrial fibrillation who presented with confusion.  Apparently patient sustained a unwitnessed fall, at the facility where she lives.  She has been treated for urinary tract infection with Macrobid for about 7 days, she was noted to be agitated and confused.  She was transported to the hospital for further evaluation.  On her initial physical examination blood pressure 98/55, 111/59, heart rate 74, respiratory 21, oxygen saturation 94%, her lungs are clear to auscultation bilaterally, heart S1-S2, present, rhythmic, soft abdomen, no lower extremity edema.  Sodium 136, potassium 3.5, chloride 92, bicarb 31, glucose 91, BUN 42, creatinine 1.0, white count 7.9, hemoglobin 16.2, hematocrit 47.7, platelets 170. SARS COVID-19 positive  Urinalysis [place] gravity 1.014, protein 100, RBC > 50, white blood cell > 50.   Head, neck CT no acute changes CT abdomen pelvis no acute changes.  Chest radiograph no infiltrates.  EKG 97 bpm, left axis deviation, left bundle branch block, sinus rhythm, no significant ST segment or T wave changes.  Patient was placed on antibiotic therapy for her urine infection along with antiviral therapy for incidental COVID-19. She developed atrial fibrillation with rapid ventricular response, placed on amiodarone infusion.  Assessment & Plan:   Principal Problem:   Sepsis secondary to UTI [facility]) Active Problems:   Memory loss   CVA (cerebral vascular accident) ([place])   HTN (hypertension)   Hyperlipidemia   Atrial fibrillation with RVR (HCC)   Pressure injury of  skin   Obesity  1. Sepsis due to urinary tract infection (end-organ failure acute metabolic encephalopathy) present on admission. Patient has completed 3 days of antibiotic therapy with good toleration. She is afebrile and no further leukocytosis. No clinical evidence of pyelonephritis.   Discontinue antibiotic therapy today.  Discontinue IV fluids for now.   2. Acute metabolic encephalopathy with delirium in the setting of baseline dementia. Patient at the time of my examination is somnolent and hypo-reactive, she has had no po intake and today has been non verbal.  Continue neuro checks per unit protocol fall and aspiration precautions. Will follow with palliative care recommendations. Patient is very poor prognosis.  Continue with memantine.   3. Atrial fibrillation with RVR. Patient on IV amiodarone for rate control, plan for anticoagulation with apixaban,  4, Positive COVID 19 viral infection. No signs of viral pneumonia, will continue medica therapy with remdesivir for 3 doses.  5. Obesity class 1 with dyslipidemia. Calculated BMI is 30, continue with statin therapy as tolerated.    Patient continue to be at high risk for worsening atrial fibrillation and metabolic encephalopathy   Status is: Inpatient  Remains inpatient appropriate because:IV treatments appropriate due to intensity of illness or inability to take PO   Dispo: The patient is from: Home              Anticipated d/c is to: SNF              Patient currently is not medically stable to d/c.   Difficult to place patient No  DVT prophylaxis: apixaban    Code Status:   DNR   Family Communication:  No family at the bedside  Nutrition Status:           Skin Documentation: Pressure Injury 05/03/21 Sacrum Mid Stage 2 -  Partial thickness loss of dermis presenting as a shallow open injury with a red, pink wound bed without slough. (Active)  05/03/21 1700  Location: Sacrum  Location Orientation: Mid   Staging: Stage 2 -  Partial thickness loss of dermis presenting as a shallow open injury with a red, pink wound bed without slough.  Wound Description (Comments):   Present on Admission: Yes     Consultants:   Cardiology   Palliative care      Subjective: Patient is very weak and deconditioned, not opening eyes or following commands today.   Objective: Vitals:   05/07/21 1100 05/07/21 1140 05/07/21 1200 05/07/21 1300  BP:  136/76 105/78 121/61  Pulse: (!) 105 96 100 (!) 113  Resp: 19 20 (!) 23 (!) 22  Temp: 100.3 F (37.9 C)     TempSrc: Axillary     SpO2: 95% 95% 93% 93%  Weight:        Intake/Output Summary (Last 24 hours) at 05/07/2021 1428 Last data filed at 05/07/2021 0600 Gross per 24 hour  Intake 1785.57 ml  Output 1500 ml  Net 285.57 ml   Filed Weights   05/03/21 1700 05/04/21 0400  Weight: 84.2 kg 86 kg    Examination:   General: Not in pain or dyspnea, deconditioned and ill looking appearing  Neurology: hyporeactive to touch or voice.  E ENT: mild pallor, no icterus, oral mucosa moist Cardiovascular: No JVD. S1-S2 present, rhythmic, no gallops, rubs, or murmurs. No lower extremity edema. Pulmonary: positive breath sounds bilaterally, adequate air movement, no wheezing, rhonchi or rales. Gastrointestinal. Abdomen soft and non tender Skin. No rashes Musculoskeletal: no joint deformities     Data Reviewed: I have personally reviewed following labs and imaging studies  CBC: Recent Labs  Lab 05/03/21 1159 05/04/21 0300 05/05/21 0241 05/07/21 0250  WBC 7.9 5.8 6.4 7.8  NEUTROABS 5.9  --   --   --   HGB 16.2* 13.2 13.5 13.6  HCT 47.7* 39.3 41.0 39.6  MCV 96.4 97.5 99.5 96.4  PLT 170 130* 99* 96*   Basic Metabolic Panel: Recent Labs  Lab 05/03/21 1159 05/04/21 0300 05/05/21 0241 05/06/21 0256 05/07/21 0250  NA 136 137 141 142 142  K 3.5 4.0 3.4* 3.6 4.7  CL 92* 105 111 116* 115*  CO2 31 25 23  21* 19*  GLUCOSE 91 68* 88 104* 94   BUN 42* 32* 27* 23 19  CREATININE 1.09* 1.17* 1.12* 1.11* 1.14*  CALCIUM 10.3 8.7* 8.2* 8.4* 8.4*  MG  --  2.7* 2.3 2.2 1.8  PHOS  --  2.4* 2.6 2.1* 3.4   GFR: Estimated Creatinine Clearance: 43.5 mL/min (A) (by C-G formula based on SCr of 1.14 mg/dL (H)). Liver Function Tests: Recent Labs  Lab 05/03/21 1159 05/04/21 0300 05/05/21 0241  AST 86* 59* 53*  ALT 78* 54* 46*  ALKPHOS 94 75 68  BILITOT 0.1* 0.7 0.6  PROT 7.3 5.3* 5.2*  ALBUMIN 3.5 2.5* 2.4*   No results for input(s): LIPASE, AMYLASE in the last 168 hours. No results for input(s): AMMONIA in the last 168 hours. Coagulation Profile: Recent Labs  Lab 05/03/21 1159 05/04/21 0300  INR 1.3* 1.4*   Cardiac Enzymes: No results for input(s): CKTOTAL, CKMB, CKMBINDEX, TROPONINI in the last 168 hours. BNP (last 3 results) No results for input(s): PROBNP in the last  8760 hours. HbA1C: No results for input(s): HGBA1C in the last 72 hours. CBG: Recent Labs  Lab 05/04/21 0839 05/04/21 0912 05/04/21 1148 05/04/21 1532 05/05/21 1223  GLUCAP 70 117* 92 92 94   Lipid Profile: No results for input(s): CHOL, HDL, LDLCALC, TRIG, CHOLHDL, LDLDIRECT in the last 72 hours. Thyroid Function Tests: Recent Labs    05/05/21 1444  TSH 1.205   Anemia Panel: No results for input(s): VITAMINB12, FOLATE, FERRITIN, TIBC, IRON, RETICCTPCT in the last 72 hours.    Radiology Studies: I have reviewed all of the imaging during this hospital visit personally     Scheduled Meds: . apixaban  5 mg Oral BID  . Chlorhexidine Gluconate Cloth  6 each Topical Daily  . clotrimazole  1 application Topical BID  . mouth rinse  15 mL Mouth Rinse BID  . memantine  10 mg Oral BID  . rosuvastatin  10 mg Oral q1800  . valACYclovir  500 mg Oral Daily   Continuous Infusions: . amiodarone 30 mg/hr (05/07/21 0857)  . ceFEPime (MAXIPIME) IV 2 g (05/07/21 1138)  . dextrose 5 % and 0.9% NaCl 75 mL/hr at 05/07/21 0600     LOS: 4 days         Dannelle Rhymes Gerome Apley, MD

## 2021-05-07 NOTE — Progress Notes (Signed)
Progress Note  Patient Name: Jennifer Arroyo Date of Encounter: 05/07/2021  [facility] Cardiologist: Jennifer Bergeron, MD   Subjective   Unresponsive to me  Inpatient Medications    Scheduled Meds: . apixaban  5 mg Oral BID  . Chlorhexidine Gluconate Cloth  6 each Topical Daily  . clotrimazole  1 application Topical BID  . mouth rinse  15 mL Mouth Rinse BID  . memantine  10 mg Oral BID  . rosuvastatin  10 mg Oral q1800  . valACYclovir  500 mg Oral Daily   Continuous Infusions: . amiodarone 30 mg/hr (05/07/21 0857)  . dextrose 5 % and 0.9% NaCl 75 mL/hr at 05/07/21 0600   PRN Meds: acetaminophen **OR** acetaminophen, metoprolol tartrate, metoprolol tartrate, ondansetron **OR** ondansetron (ZOFRAN) IV, polyvinyl alcohol   Vital Signs    Vitals:   05/07/21 1200 05/07/21 1300 05/07/21 1400 05/07/21 1534  BP: 105/78 121/61 (!) 117/57   Pulse: 100 (!) 113 100   Resp: (!) 23 (!) 22 (!) 21   Temp:    98.9 F (37.2 C)  TempSrc:    Axillary  SpO2: 93% 93% 92%   Weight:        Intake/Output Summary (Last 24 hours) at 05/07/2021 1610 Last data filed at 05/07/2021 0600 Gross per 24 hour  Intake 1785.57 ml  Output 1500 ml  Net 285.57 ml   Last 3 Weights 05/04/2021 05/03/2021 02/05/2021  Weight (lbs) 189 lb 9.5 oz 185 lb 10 oz 170 lb  Weight (kg) 86 kg 84.2 kg 77.111 kg      Telemetry    NSR, wide complex tachycardia regular  - Personally Reviewed  ECG    NA - Personally Reviewed  Physical Exam   Does not open eyes Non focal Heart Irregular   Labs    High Sensitivity Troponin:  No results for input(s): TROPONINIHS in the last 720 hours.    Chemistry Recent Labs  Lab 05/03/21 1159 05/04/21 0300 05/05/21 0241 05/06/21 0256 05/07/21 0250  NA 136 137 141 142 142  K 3.5 4.0 3.4* 3.6 4.7  CL 92* 105 111 116* 115*  CO2 31 25 23  21* 19*  GLUCOSE 91 68* 88 104* 94  BUN 42* 32* 27* 23 19  CREATININE 1.09* 1.17* 1.12* 1.11* 1.14*  CALCIUM 10.3 8.7* 8.2*  8.4* 8.4*  PROT 7.3 5.3* 5.2*  --   --   ALBUMIN 3.5 2.5* 2.4*  --   --   AST 86* 59* 53*  --   --   ALT 78* 54* 46*  --   --   ALKPHOS 94 75 68  --   --   BILITOT 0.1* 0.7 0.6  --   --   GFRNONAA 51* 47* 50* 50* 49*  ANIONGAP 13 7 7 5 8      Hematology Recent Labs  Lab 05/04/21 0300 05/05/21 0241 05/07/21 0250  WBC 5.8 6.4 7.8  RBC 4.03 4.12 4.11  HGB 13.2 13.5 13.6  HCT 39.3 41.0 39.6  MCV 97.5 99.5 96.4  MCH 32.8 32.8 33.1  MCHC 33.6 32.9 34.3  RDW 13.8 14.4 14.6  PLT 130* 99* 96*    BNPNo results for input(s): BNP, PROBNP in the last 168 hours.   DDimer No results for input(s): DDIMER in the last 168 hours.   Radiology    No results found.  Cardiac Studies     Patient Profile     80 y.o. female   [name]  Atrial fibrillation / flutter LBBB with aberrancy   Now on amiodarone with PRN IV beta blocker.  She is taking POs as I am seeing including the Eliquis.  I am surprised that she is waking up to take meds.  I spoke with her nurse and given the increased HR at times including last night I will continue the IV amiodarone. However, waiting for final goals of therapy discussions.      For questions or updates, please contact [place] Please consult [email or web address] for contact info under        Signed, Minus Breeding, MD  05/07/2021, 4:10 PM

## 2021-05-07 NOTE — Progress Notes (Signed)
Called husband to see if he wanted to come in with wife's rhythm changes.  He is coming and security aware.

## 2021-05-07 NOTE — Progress Notes (Signed)
Just after 0300, pt heart rate increased to sustain in the 130-150s still with a rhythm of a fib RVR. PRN metoproplol given however minimal change noted to HR. MD on call notified and family informed of change in pt condition. New orders received and implemented, After a second dose of metoprolol IV, BP sustained substantial drop to 100/62 with HR decreasing to 110-130s. Spoke with MD on call again and will waiting on giving any addition meds for pt's Afib RVR, apart from the current Amiodarone gtt, at this immediate time. Husband at bedside made aware that we would monitor her condition for the time and he states understand of the plan of care for the current episode of Afib RVR.

## 2021-05-07 NOTE — Progress Notes (Signed)
SLP Cancellation Note  Patient Details Name: Jennifer Arroyo MRN: [id number] DOB: 04-20-1941   Cancelled treatment:       Reason Eval/Treat Not Completed: Other (comment) (RN reports pt not ready for po, sealing lips tightly and not allowing oral care, will continue efforts)   SLP has spoken to palliative MD who reported spouse is wanting pt to eat. Will follow up next date.  Her cognition continues to seem to be primary barrier to po.   Kathleen Lime, MS [facility] SLP Acute Rehab Services Office [phone] Pager [phone]   Macario Golds 05/07/2021, 5:15 PM

## 2021-05-07 NOTE — Progress Notes (Signed)
Daily Progress Note   Patient Name: Jennifer Arroyo       Date: 05/07/2021 DOB: [birth year]  Age: 80 y.o. MRN#: [id number] Attending Physician: Tawni Millers Primary Care Physician: Pcp, No Admit Date: 05/03/2021  Reason for Consultation/Follow-up: Establishing goals of care  Subjective: I saw and examined Jennifer Arroyo today.  She remains pleasantly confused.  She did not really speak much today.  I called and discussed with her husband.  See below.  Length of Stay: 4  Current Medications: Scheduled Meds:  . apixaban  5 mg Oral BID  . Chlorhexidine Gluconate Cloth  6 each Topical Daily  . clotrimazole  1 application Topical BID  . mouth rinse  15 mL Mouth Rinse BID  . memantine  10 mg Oral BID  . rosuvastatin  10 mg Oral q1800  . valACYclovir  500 mg Oral Daily    Continuous Infusions: . amiodarone 30 mg/hr (05/07/21 0600)  . ceFEPime (MAXIPIME) IV Stopped (05/06/21 2248)  . dextrose 5 % and 0.9% NaCl 75 mL/hr at 05/07/21 0600    PRN Meds: acetaminophen **OR** acetaminophen, metoprolol tartrate, metoprolol tartrate, ondansetron **OR** ondansetron (ZOFRAN) IV, polyvinyl alcohol  Physical Exam         General: Alert, awake, in no acute distress. Pleasantly confused HEENT: No bruits, no goiter, no JVD Heart: Irregular.  Tachycardic.Marland Kitchen No murmur appreciated. Lungs: Decreased air movement, clear Abdomen: Soft, nontender, nondistended, positive bowel sounds.  Ext: No significant edema Skin: Warm and dry Neuro: Moves 4 extremities.  No focal deficits noted.  Vital Signs: BP 116/83 (BP Location: Right Arm)   Pulse (!) 111   Temp 97.9 F (36.6 C) (Oral)   Resp 20   Wt 86 kg   SpO2 92%   BMI 30.60 kg/m  SpO2: SpO2: 92 % O2 Device: O2 Device: Nasal Cannula O2  Flow Rate: O2 Flow Rate (L/min): 3 L/min  Intake/output summary:   Intake/Output Summary (Last 24 hours) at 05/07/2021 0737 Last data filed at 05/07/2021 0600 Gross per 24 hour  Intake 1785.57 ml  Output 1500 ml  Net 285.57 ml   LBM: Last BM Date:  (PTA) Baseline Weight: Weight: 84.2 kg Most recent weight: Weight: 86 kg       Palliative Assessment/Data:    Clinical research associate Row Most Recent  Value  Intake Tab   Referral Department Hospitalist  Unit at Time of Referral Med/Surg Unit  Palliative Care Primary Diagnosis Sepsis/Infectious Disease  Date Notified 05/04/21  Reason for referral Clarify Goals of Care  Date of Admission 05/03/21  Date first seen by Palliative Care 05/05/21  # of days Palliative referral response time 1 Day(s)  # of days IP prior to Palliative referral 1  Clinical Assessment   Palliative Performance Scale Score 30%  Psychosocial & Spiritual Assessment   Palliative Care Outcomes   Patient/Family meeting held? Yes  Who was at the meeting? Husband via phone  Palliative Care Outcomes Clarified goals of care      Patient Active Problem List   Diagnosis Date Noted  . Pressure injury of skin 05/04/2021  . Sepsis ([place]) 05/03/2021  . CVA (cerebral vascular accident) ([place]) 09/19/2020  . New onset a-fib ([place]) 09/19/2020  . HTN (hypertension) 09/19/2020  . Hyperlipidemia 09/19/2020  . Dementia without behavioral disturbance ([place]) 09/19/2020  . Fall at home, initial encounter 09/19/2020  . Bacteria in urine 09/19/2020  . Atrial fibrillation with RVR ([place])   . Memory loss 01/11/2019  . Gait abnormality 01/11/2019    Palliative Care Assessment & Plan   Patient Profile: 80 y.o. female  with past medical history of dementia, hyperlipidemia, hypertension, A. fib, CVA, resident long-term assisted living admitted on 05/03/2021 with fall and altered mental status.  Work-up revealed concern for UTI as well as positive COVID.  Her encephalopathy worsened  yesterday but she appears to be improved today.  She has also had A. fib with RVR.  Palliative consulted for goals of care.  Recommendations/Plan: Continue current interventions.  Discussed again regarding concerns if her mental status will preclude her being able to maintain nutrition and hydration moving forward. Plan to continue to assess an continue conversation based upon clinical course over the next few days.   Goals of Care and Additional Recommendations: Limitations on Scope of Treatment: Full Scope Treatment  Code Status:    Code Status Orders  (From admission, onward)         Start     Ordered   05/04/21 0923  Do not attempt resuscitation (DNR)  Continuous       Question Answer Comment  In the event of cardiac or respiratory ARREST Do not call a "code blue"   In the event of cardiac or respiratory ARREST Do not perform Intubation, CPR, defibrillation or ACLS   In the event of cardiac or respiratory ARREST Use medication by any route, position, wound care, and other measures to relive pain and suffering. May use oxygen, suction and manual treatment of airway obstruction as needed for comfort.      05/04/21 2878        Code Status History    Date Active Date Inactive Code Status Order ID Comments User Context   05/03/2021 1717 05/04/2021 0922 Full Code [id number]  Jonnie Finner, DO Inpatient   09/19/2020 1937 09/22/2020 1746 DNR [id number]  Eugenie Filler, MD Inpatient   Advance Care Planning Activity    Advance Directive Documentation   Flowsheet Row Most Recent Value  Type of Advance Directive Out of facility DNR (pink MOST or yellow form)  Pre-existing out of facility DNR order (yellow form or pink MOST form) Yellow form placed in chart (order not valid for inpatient use)  "MOST" Form in Place? --      Prognosis:  Guarded  Discharge Planning: To Be Determined  Care plan was discussed with husband  Thank you for allowing the Palliative Medicine Team to  assist in the care of this patient.   Time In: 1840 Time Out: 1900 Total Time 20 Prolonged Time Billed No      Greater than 50%  of this time was spent counseling and coordinating care related to the above assessment and plan.  Micheline Rough, MD  Please contact Palliative Medicine Team phone at [phone] for questions and concerns.

## 2021-05-08 DIAGNOSIS — L89892 Pressure ulcer of other site, stage 2: Secondary | ICD-10-CM

## 2021-05-08 LAB — BASIC METABOLIC PANEL
Anion gap: 8 (ref 5–15)
BUN: 20 mg/dL (ref 8–23)
CO2: 20 mmol/L — ABNORMAL LOW (ref 22–32)
Calcium: 8.6 mg/dL — ABNORMAL LOW (ref 8.9–10.3)
Chloride: 115 mmol/L — ABNORMAL HIGH (ref 98–111)
Creatinine, Ser: 1.06 mg/dL — ABNORMAL HIGH (ref 0.44–1.00)
GFR, Estimated: 53 mL/min — ABNORMAL LOW (ref >60)
Glucose, Bld: 92 mg/dL (ref 70–99)
Potassium: 3.9 mmol/L (ref 3.5–5.1)
Sodium: 143 mmol/L (ref 135–145)

## 2021-05-08 LAB — CULTURE, BLOOD (ROUTINE X 2)
Culture: NO GROWTH
Culture: NO GROWTH
Special Requests: ADEQUATE

## 2021-05-08 LAB — MAGNESIUM: Magnesium: 2 mg/dL (ref 1.7–2.4)

## 2021-05-08 LAB — GLUCOSE, CAPILLARY
Glucose-Capillary: 78 mg/dL (ref 70–99)
Glucose-Capillary: 120 mg/dL — ABNORMAL HIGH (ref 70–99)

## 2021-05-08 NOTE — TOC Progression Note (Signed)
Transition of Care [facility]) - Progression Note    Patient Details  Name: Jennifer Arroyo MRN: [id number] Date of Birth: May 20, 1941  [facility]) CM/SW Contact  Leeroy Cha, RN Phone Number: 05/08/2021, 8:48 AM  Clinical Narrative:    80 year old female past medical history for dementia, dyslipidemia, hypertension, history of CVA and atrial fibrillation who presented with confusion.  Apparently patient sustained a unwitnessed fall, at the facility where she lives.  She has been treated for urinary tract infection with Macrobid for about 7 days, she was noted to be agitated and confused.  She was transported to the hospital for further evaluation.  On her initial physical examination blood pressure 98/55, 111/59, heart rate 74, respiratory 21, oxygen saturation 94%, her lungs are clear to auscultation bilaterally, heart S1-S2, present, rhythmic, soft abdomen, no lower extremity edema. PLAN: Reason for Consultation/Follow-up: Establishing goals of care  Subjective:\   She is sleepier and did not really interact with me today.  I called and spoke with her husband.  He tells me that he has been at the hospital last 2 nights after she has had arrhythmia.  We talked about continued concern regarding her multiple comorbidities including mental status that is precluding her from having nutrition and hydration.  Discussed that I am concerned that this may be a recoverable illness and he expressed understanding.  I shared that I did not think invasive interventions, such as consideration for feeding tube, would be helpful when he agreed.  Discussed plan for meeting tomorrow around 10 AM to discuss overall clinical course and next steps. PLAN: family meeting on [phone] to plan [place]. Expected Discharge Plan: Assisted Living Barriers to Discharge: Continued Medical Work up  Expected Discharge Plan and Services Expected Discharge Plan: Assisted Living   Discharge Planning Services: CM  Consult   Living arrangements for the past 2 months: Assisted Living Facility                                       Social Determinants of Health (SDOH) Interventions    Readmission Risk Interventions No flowsheet data found.

## 2021-05-08 NOTE — Progress Notes (Signed)
  Speech Language Pathology Treatment: Dysphagia  Patient Details Name: Jennifer Arroyo MRN: [id number] DOB: Oct 01, 1941 Today's Date: 05/08/2021 Time: 2751-7001 SLP Time Calculation (min) (ACUTE ONLY): 19 min  Assessment / Plan / Recommendation Clinical Impression  Mrs. Test was responsive and followed simple commands. Accepted cup and straw sips thin, puree and solid with assist to control volume. Mastication was slow and somewhat deliberate. Throughout session her respirations remained stable and no s/s aspiration were present. Swallow noted with audible sluggishness sometimes marking dyscoordination. Puree, thin, straws allowed, pills crushed and full assist with meals is recommended. Therapist will follow up on her safety and efficiency.    HPI HPI: Pt is an 80 yo pt with dementia, brough in after being found down at her facility.  Per Md notes, pt potentially had a fall.  No apparent head injury.  Facility reports she had been increasingly agitated recently and ?'d UTI.  She was treated for UTI but agitation did not improve.  PMH + for hyperlipidemia, hypertension, A. 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  She is found to be COVID+ and is started on remdesivir.  Imaging studies negative.  Pt has not undergone prior SLP swallow evaluation.  Pt seen by neurology - note reviewed from 2018 - pt with progressive memory deficits at that time and sopuse desired tp to have people come in to help take care of her.  No family present at this time.      SLP Plan  Continue with current plan of care       Recommendations  Diet recommendations: Thin liquid;Dysphagia 1 (puree) Liquids provided via: Cup;Straw Medication Administration: Crushed with puree Supervision: Staff to assist with self feeding;Full supervision/cueing for compensatory strategies Compensations: Slow rate;Small sips/bites;Minimize environmental  distractions Postural Changes and/or Swallow Maneuvers: Seated upright 90 degrees                Oral Care Recommendations: Oral care BID Follow up Recommendations: Skilled Nursing facility SLP Visit Diagnosis: Dysphagia, unspecified (R13.10) Plan: Continue with current plan of care                       Houston Siren 05/08/2021, 3:32 PM   Orbie Pyo Colvin Caroli.Ed Risk analyst [phone] Office [phone]

## 2021-05-08 NOTE — Progress Notes (Signed)
Daily Progress Note   Patient Name: Jennifer Arroyo       Date: 05/08/2021 DOB: Nov 23, 1941  Age: 80 y.o. MRN#: [id number] Attending Physician: Tawni Millers Primary Care Physician: Pcp, No Admit Date: 05/03/2021  Reason for Consultation/Follow-up: Establishing goals of care  Subjective: I met this morning with [name] husband in conjunction with chaplain, Joellen Jersey.  We discussed clinical course as well as wishes moving forward in regard to care plan this hospitalization.  We discussed difference between a aggressive medical intervention path and a palliative, comfort focused care path.  Values and goals of care important to patient and family were attempted to be elicited.  Mr. Branscum reports that he has continued to see decline in his wife's overall cognitive and functional status over the past several months.  We discussed concerns about her overall condition and altered mental status with lack of nutrition and hydration.  He is very receptive to information and easily engages in frank conversation regarding the fact we may be approaching end-of-life.  At the same time, we discussed the fact that she has much more awake and alert today.  She is not currently having any signs of distress, but she is still not having any real nutrition or hydration.  Because she is doing better today, however, we discussed plan to continue with current interventions as long as we are not seeing signs of suffering and continue to follow her course through the weekend.  If after her diet is liberalized, she is still not taking in sufficient nutrition hydration, we discussed that likely recommendation would be for focus on comfort with consideration for someplace like residential hospice for end-of-life  care.  After our conversation, Mr. Mroz and I visited with his wife in her room.  She is certainly more awake and interactive today and smiles and talks with him.  She is confused to her situation but certainly recognizes him and expresses how much she loves him several times.  Following meeting again with her in room, we confirmed plan to continue with current interventions, ask speech therapy to evaluate and make recommendations on most appropriate diet moving forward for the weekend, and reassessing her situation early next week.  If she does have signs of suffering or declines over the weekend, we will readdress at that time.  Questions  and concerns addressed.   PMT will continue to support holistically.  Length of Stay: 5  Current Medications: Scheduled Meds:  . apixaban  5 mg Oral BID  . Chlorhexidine Gluconate Cloth  6 each Topical Daily  . clotrimazole  1 application Topical BID  . mouth rinse  15 mL Mouth Rinse BID  . memantine  10 mg Oral BID  . rosuvastatin  10 mg Oral q1800  . valACYclovir  500 mg Oral Daily    Continuous Infusions: . amiodarone 30 mg/hr (05/08/21 1548)    PRN Meds: acetaminophen **OR** acetaminophen, metoprolol tartrate, metoprolol tartrate, ondansetron **OR** ondansetron (ZOFRAN) IV, polyvinyl alcohol  Physical Exam         General:  Sleepy. HEENT: No bruits, no goiter, no JVD Heart: Irregular.  Tachycardic.Marland Kitchen No murmur appreciated. Lungs: Decreased air movement, clear Abdomen: Soft, nontender, nondistended, positive bowel sounds.  Ext: No significant edema Skin: Warm and dry Neuro: Moves 4 extremities.  No focal deficits noted.  Vital Signs: BP 116/81 (BP Location: Left Wrist)   Pulse (!) 56   Temp 97.9 F (36.6 C) (Axillary)   Resp 19   Wt 86 kg   SpO2 100%   BMI 30.60 kg/m  SpO2: SpO2: 100 % O2 Device: O2 Device: Nasal Cannula O2 Flow Rate: O2 Flow Rate (L/min): 3 L/min  Intake/output summary:   Intake/Output Summary (Last 24  hours) at 05/08/2021 1725 Last data filed at 05/08/2021 1643 Gross per 24 hour  Intake 1378.14 ml  Output 500 ml  Net 878.14 ml   LBM: Last BM Date:  (PTA) Baseline Weight: Weight: 84.2 kg Most recent weight: Weight: 86 kg       Palliative Assessment/Data:    Flowsheet Rows   Flowsheet Row Most Recent Value  Intake Tab   Referral Department Hospitalist  Unit at Time of Referral Med/Surg Unit  Palliative Care Primary Diagnosis Sepsis/Infectious Disease  Date Notified 05/04/21  Reason for referral Clarify Goals of Care  Date of Admission 05/03/21  Date first seen by Palliative Care 05/05/21  # of days Palliative referral response time 1 Day(s)  # of days IP prior to Palliative referral 1  Clinical Assessment   Palliative Performance Scale Score 30%  Psychosocial & Spiritual Assessment   Palliative Care Outcomes   Patient/Family meeting held? Yes  Who was at the meeting? Husband via phone  Palliative Care Outcomes Clarified goals of care      Patient Active Problem List   Diagnosis Date Noted  . Sepsis secondary to UTI ([place]) 05/07/2021  . Obesity 05/07/2021  . Pressure injury of skin 05/04/2021  . Sepsis ([place]) 05/03/2021  . CVA (cerebral vascular accident) ([place]) 09/19/2020  . New onset a-fib ([place]) 09/19/2020  . HTN (hypertension) 09/19/2020  . Hyperlipidemia 09/19/2020  . Dementia without behavioral disturbance ([place]) 09/19/2020  . Fall at home, initial encounter 09/19/2020  . Bacteria in urine 09/19/2020  . Atrial fibrillation with RVR ([place])   . Memory loss 01/11/2019  . Gait abnormality 01/11/2019    Palliative Care Assessment & Plan   Patient Profile: 80 y.o. female  with past medical history of dementia, hyperlipidemia, hypertension, A. fib, CVA, resident long-term assisted living admitted on 05/03/2021 with fall and altered mental status.  Work-up revealed concern for UTI as well as positive COVID.  Her encephalopathy worsened yesterday but she appears to be  improved today.  She has also had A. fib with RVR.  Palliative consulted for goals of care.  Recommendations/Plan: Continue  current interventions.  While her overall prognosis remains guarded, she is more awake and alert today.  We discussed plan to continue with current interventions through the weekend and reassess her overall intake and situation early next week.  If she continues to improve, we will discuss next steps regarding potential discharge from the hospital and if she would benefit from hospice services long-term.  If she worsens or is not able to maintain enough nutrition and hydration to sustain herself, we discussed likely recommendation for residential hospice for end-of-life care. Palliative care to continue to follow through the weekend.  Goals of Care and Additional Recommendations: Limitations on Scope of Treatment: Full Scope Treatment  Code Status:    Code Status Orders  (From admission, onward)         Start     Ordered   05/04/21 0923  Do not attempt resuscitation (DNR)  Continuous       Question Answer Comment  In the event of cardiac or respiratory ARREST Do not call a "code blue"   In the event of cardiac or respiratory ARREST Do not perform Intubation, CPR, defibrillation or ACLS   In the event of cardiac or respiratory ARREST Use medication by any route, position, wound care, and other measures to relive pain and suffering. May use oxygen, suction and manual treatment of airway obstruction as needed for comfort.      05/04/21 3009        Code Status History    Date Active Date Inactive Code Status Order ID Comments User Context   05/03/2021 1717 05/04/2021 0922 Full Code [id number]  Jonnie Finner, DO Inpatient   09/19/2020 1937 09/22/2020 1746 DNR [id number]  Eugenie Filler, MD Inpatient   Advance Care Planning Activity    Advance Directive Documentation   Flowsheet Row Most Recent Value  Type of Advance Directive Out of facility DNR (pink MOST or  yellow form)  Pre-existing out of facility DNR order (yellow form or pink MOST form) Yellow form placed in chart (order not valid for inpatient use)  "MOST" Form in Place? --      Prognosis:  Guarded/poor  Discharge Planning: To Be Determined  Care plan was discussed with husband, chaplain, bedside RN, and Dr. Cathlean Sauer  Thank you for allowing the Palliative Medicine Team to assist in the care of this patient.   Time In: 1000 Time Out: 1045 Total Time 45 Prolonged Time Billed No   Greater than 50%  of this time was spent counseling and coordinating care related to the above assessment and plan.  Micheline Rough, MD  Please contact Palliative Medicine Team phone at [phone] for questions and concerns.

## 2021-05-08 NOTE — Progress Notes (Signed)
PROGRESS NOTE    EZELLA KELL  SPQ:[id number] DOB: [birth year] DOA: 05/03/2021 PCP: Pcp, No    Brief Narrative:  Jennifer Arroyo was admitted to the hospital with working diagnosis of severe sepsis secondary to urinary tract infection, endorgan failure acute metabolic encephalopathy (present on admission).  80 year old female past medical history for dementia, dyslipidemia, hypertension, history of CVA and atrial fibrillation who presented with confusion.  Apparently patient sustained a unwitnessed fall, at the facility where she lives.  She has been treated for urinary tract infection with Macrobid for about 7 days, she was noted to be agitated and confused.  She was transported to the hospital for further evaluation.  On her initial physical examination blood pressure 98/55, 111/59, heart rate 74, respiratory 21, oxygen saturation 94%, her lungs are clear to auscultation bilaterally, heart S1-S2, present, rhythmic, soft abdomen, no lower extremity edema.  Sodium 136, potassium 3.5, chloride 92, bicarb 31, glucose 91, BUN 42, creatinine 1.0, white count 7.9, hemoglobin 16.2, hematocrit 47.7, platelets 170. SARS COVID-19 positive  Urinalysis [place] gravity 1.014, protein 100, RBC > 50, white blood cell > 50.   Head, neck CT no acute changes CT abdomen pelvis no acute changes.  Chest radiograph no infiltrates.  EKG 97 bpm, left axis deviation, left bundle branch block, sinus rhythm, no significant ST segment or T wave changes.  Patient was placed on antibiotic therapy for her urine infection along with antiviral therapy for incidental COVID-19. She developed atrial fibrillation with rapid ventricular response, placed on amiodarone infusion.  Metabolic encephalopathy with poor oral intake, very weak and deconditioned. Palliative Care has been consulted, with recommendations to continue supportive care, if patient with no po intake and continue to decline health, plan for hospice on  06/06.   Assessment & Plan:   Principal Problem:   Sepsis secondary to UTI [facility]) Active Problems:   Memory loss   CVA (cerebral vascular accident) ([place])   HTN (hypertension)   Hyperlipidemia   Atrial fibrillation with RVR ([facility])   Pressure injury of skin   Obesity   1. Sepsis due to urinary tract infection (end-organ failure acute metabolic encephalopathy) present on admission.  Completed antibiotic therapy.  2. Acute metabolic encephalopathy with delirium in the setting of baseline dementia.  Today patient is more awake and alert, follows simple commands and follows simple questions. Continue to have no oral intake.  Continue with memantine  Continue neuro checks and encourage po nutrition.   3. Atrial fibrillation with RVR.  Patient has converted to sinus rhythm personally telemetry.  On oral apixaban but not taking it due to refusing meds Continue to assess po intake, before transitioning to oral amiodarone.   4, Positive COVID 19 viral infection. Sp remdesivir 3 doses.  Needs 5 more days in order to dc isolation.   5. Obesity class 1 with dyslipidemia/ stage 2 sacrum pressure ulcer present on admission. Calculated BMI is 30,  Statin as tolerated.  Continue with local skin care.     Patient continue to be at high risk for worsening encephalopathy   Status is: Inpatient  Remains inpatient appropriate because:Inpatient level of care appropriate due to severity of illness   Dispo: The patient is from: Home              Anticipated d/c is to: to be determined, possible hospice               Patient currently is not medically stable to d/c.   Difficult to place  patient No   DVT prophylaxis: scd   Code Status:   dnr   Family Communication:  No family at the bedside         Skin Documentation: Pressure Injury 05/03/21 Sacrum Mid Stage 2 -  Partial thickness loss of dermis presenting as a shallow open injury with a red, pink wound bed without slough.  (Active)  05/03/21 1700  Location: Sacrum  Location Orientation: Mid  Staging: Stage 2 -  Partial thickness loss of dermis presenting as a shallow open injury with a red, pink wound bed without slough.  Wound Description (Comments):   Present on Admission: Yes     Consultants:   Cardiology   Palliative care    Subjective: Patient is more awake and alert, not in pain or dyspnea, continue to be very weak and deconditioned, no oral intake.   Objective: Vitals:   05/08/21 0400 05/08/21 0445 05/08/21 0800 05/08/21 0851  BP:  97/65 116/81   Pulse:  (!) 59 (!) 56   Resp:  19 19   Temp: 97.8 F (36.6 C)   97.9 F (36.6 C)  TempSrc: Axillary   Axillary  SpO2:  96% 100%   Weight:        Intake/Output Summary (Last 24 hours) at 05/08/2021 1241 Last data filed at 05/08/2021 0400 Gross per 24 hour  Intake 1182.05 ml  Output --  Net 1182.05 ml   Filed Weights   05/03/21 1700 05/04/21 0400  Weight: 84.2 kg 86 kg    Examination:   General: Not in pain or dyspnea, deconditioned  Neurology: Awake and alert, non focal  E ENT: positive pallor, no icterus, oral mucosa moist Cardiovascular: No JVD. S1-S2 present, rhythmic, no gallops, rubs, or murmurs. ++ non pitting bilateral  lower extremity edema. Pulmonary: positive breath sounds bilaterally, adequate air movement, no wheezing, rhonchi or rales. Gastrointestinal. Abdomen protuberant  Skin. No rashes Musculoskeletal: no joint deformities     Data Reviewed: I have personally reviewed following labs and imaging studies  CBC: Recent Labs  Lab 05/03/21 1159 05/04/21 0300 05/05/21 0241 05/07/21 0250  WBC 7.9 5.8 6.4 7.8  NEUTROABS 5.9  --   --   --   HGB 16.2* 13.2 13.5 13.6  HCT 47.7* 39.3 41.0 39.6  MCV 96.4 97.5 99.5 96.4  PLT 170 130* 99* 96*   Basic Metabolic Panel: Recent Labs  Lab 05/04/21 0300 05/05/21 0241 05/06/21 0256 05/07/21 0250 05/08/21 0301  NA 137 141 142 142 143  K 4.0 3.4* 3.6 4.7 3.9  CL  105 111 116* 115* 115*  CO2 25 23 21* 19* 20*  GLUCOSE 68* 88 104* 94 92  BUN 32* 27* 23 19 20   CREATININE 1.17* 1.12* 1.11* 1.14* 1.06*  CALCIUM 8.7* 8.2* 8.4* 8.4* 8.6*  MG 2.7* 2.3 2.2 1.8 2.0  PHOS 2.4* 2.6 2.1* 3.4  --    GFR: Estimated Creatinine Clearance: 46.8 mL/min (A) (by C-G formula based on SCr of 1.06 mg/dL (H)). Liver Function Tests: Recent Labs  Lab 05/03/21 1159 05/04/21 0300 05/05/21 0241  AST 86* 59* 53*  ALT 78* 54* 46*  ALKPHOS 94 75 68  BILITOT 0.1* 0.7 0.6  PROT 7.3 5.3* 5.2*  ALBUMIN 3.5 2.5* 2.4*   No results for input(s): LIPASE, AMYLASE in the last 168 hours. No results for input(s): AMMONIA in the last 168 hours. Coagulation Profile: Recent Labs  Lab 05/03/21 1159 05/04/21 0300  INR 1.3* 1.4*   Cardiac Enzymes: No results for  input(s): CKTOTAL, CKMB, CKMBINDEX, TROPONINI in the last 168 hours. BNP (last 3 results) No results for input(s): PROBNP in the last 8760 hours. HbA1C: No results for input(s): HGBA1C in the last 72 hours. CBG: Recent Labs  Lab 05/04/21 0912 05/04/21 1148 05/04/21 1532 05/05/21 1223 05/07/21 2358  GLUCAP 117* 92 92 94 78   Lipid Profile: No results for input(s): CHOL, HDL, LDLCALC, TRIG, CHOLHDL, LDLDIRECT in the last 72 hours. Thyroid Function Tests: Recent Labs    05/05/21 1444  TSH 1.205   Anemia Panel: No results for input(s): VITAMINB12, FOLATE, FERRITIN, TIBC, IRON, RETICCTPCT in the last 72 hours.    Radiology Studies: I have reviewed all of the imaging during this hospital visit personally     Scheduled Meds: . apixaban  5 mg Oral BID  . Chlorhexidine Gluconate Cloth  6 each Topical Daily  . clotrimazole  1 application Topical BID  . mouth rinse  15 mL Mouth Rinse BID  . memantine  10 mg Oral BID  . rosuvastatin  10 mg Oral q1800  . valACYclovir  500 mg Oral Daily   Continuous Infusions: . amiodarone 30 mg/hr (05/08/21 0940)     LOS: 5 days        Trellis Vanoverbeke Gerome Apley,  MD

## 2021-05-08 NOTE — Progress Notes (Signed)
    Awaiting goals of therapy discussions.  Continuing IV amiodarone for now.  It does not look like she took any PO meds yesterday.

## 2021-05-08 NOTE — Progress Notes (Signed)
I was asked to attend a palliative care meeting this morning with pt's husband.  I spent time with him prior to the meeting and was present during the first part of the meeting to offer support and ministry of presence.  [place], Bcc Pager, [phone] 4:42 PM

## 2021-05-09 DIAGNOSIS — I639 Cerebral infarction, unspecified: Secondary | ICD-10-CM

## 2021-05-09 MED ORDER — AMIODARONE HCL 200 MG PO TABS
200.0000 mg | ORAL_TABLET | Freq: Two times a day (BID) | ORAL | Status: DC
  Administered 2021-05-09 (×2): 200 mg via ORAL
  Filled 2021-05-09 (×2): qty 1

## 2021-05-09 NOTE — Progress Notes (Signed)
Daily Progress Note   Patient Name: Jennifer Arroyo       Date: 05/09/2021 DOB: Apr 07, 1941  Age: 80 y.o. MRN#: [id number] Attending Physician: Tawni Millers Primary Care Physician: Pcp, No Admit Date: 05/03/2021  Reason for Consultation/Follow-up: Establishing goals of care  Subjective: I saw and examined Jennifer Arroyo today.  She is awake and alert no distress.  She denies any complaints at this time.  Discussed with bedside RN.  Still not much as far as intake. She is taking in small amounts (few small bites of applesauce, etc) when offered.  Length of Stay: 6  Current Medications: Scheduled Meds:  . amiodarone  200 mg Oral BID  . apixaban  5 mg Oral BID  . Chlorhexidine Gluconate Cloth  6 each Topical Daily  . clotrimazole  1 application Topical BID  . mouth rinse  15 mL Mouth Rinse BID  . memantine  10 mg Oral BID  . rosuvastatin  10 mg Oral q1800  . valACYclovir  500 mg Oral Daily    Continuous Infusions:   PRN Meds: acetaminophen **OR** acetaminophen, metoprolol tartrate, ondansetron **OR** ondansetron (ZOFRAN) IV, polyvinyl alcohol  Physical Exam         General:  Awake and alert. HEENT: No bruits, no goiter, no JVD Heart: Irregular.  Tachycardic.Marland Kitchen No murmur appreciated. Lungs: Decreased air movement, clear Abdomen: Soft, nontender, nondistended, positive bowel sounds.  Ext: No significant edema Skin: Warm and dry Neuro: Moves 4 extremities.  No focal deficits noted.  Vital Signs: BP (!) 153/87 (BP Location: Left Wrist)   Pulse 89   Temp 98.9 F (37.2 C) (Axillary)   Resp (!) 24   Wt 86 kg   SpO2 92%   BMI 30.60 kg/m  SpO2: SpO2: 92 % O2 Device: O2 Device: Nasal Cannula O2 Flow Rate: O2 Flow Rate (L/min): 4 L/min  Intake/output summary:    Intake/Output Summary (Last 24 hours) at 05/09/2021 1901 Last data filed at 05/09/2021 1100 Gross per 24 hour  Intake 317.58 ml  Output 175 ml  Net 142.58 ml   LBM: Last BM Date:  (PTA) Baseline Weight: Weight: 84.2 kg Most recent weight: Weight: 86 kg       Palliative Assessment/Data:    Flowsheet Rows   Flowsheet Row Most Recent Value  Intake Tab  Referral Department Hospitalist  Unit at Time of Referral Med/Surg Unit  Palliative Care Primary Diagnosis Sepsis/Infectious Disease  Date Notified 05/04/21  Reason for referral Clarify Goals of Care  Date of Admission 05/03/21  Date first seen by Palliative Care 05/05/21  # of days Palliative referral response time 1 Day(s)  # of days IP prior to Palliative referral 1  Clinical Assessment   Palliative Performance Scale Score 30%  Psychosocial & Spiritual Assessment   Palliative Care Outcomes   Patient/Family meeting held? Yes  Who was at the meeting? Husband via phone  Palliative Care Outcomes Clarified goals of care      Patient Active Problem List   Diagnosis Date Noted  . Sepsis secondary to UTI ([place]) 05/07/2021  . Obesity 05/07/2021  . Pressure injury of skin 05/04/2021  . Sepsis ([place]) 05/03/2021  . CVA (cerebral vascular accident) ([place]) 09/19/2020  . New onset a-fib ([place]) 09/19/2020  . HTN (hypertension) 09/19/2020  . Hyperlipidemia 09/19/2020  . Dementia without behavioral disturbance ([place]) 09/19/2020  . Fall at home, initial encounter 09/19/2020  . Bacteria in urine 09/19/2020  . Atrial fibrillation with RVR ([place])   . Memory loss 01/11/2019  . Gait abnormality 01/11/2019    Palliative Care Assessment & Plan   Patient Profile: 80 y.o. female  with past medical history of dementia, hyperlipidemia, hypertension, A. fib, CVA, resident long-term assisted living admitted on 05/03/2021 with fall and altered mental status.  Work-up revealed concern for UTI as well as positive COVID.  Her encephalopathy worsened  yesterday but she appears to be improved today.  She has also had A. fib with RVR.  Palliative consulted for goals of care.  Recommendations/Plan: Continue current interventions.  While her prognosis remains guarded, she is more awake and alert.  Plan to continue with current interventions through the weekend and reassess her overall intake and situation early next week.  If she continues to improve, we will discuss next steps regarding potential discharge from the hospital and if she would benefit from hospice services long-term.  If she worsens or is not able to maintain enough nutrition and hydration to sustain herself, we discussed likely recommendation for residential hospice for end-of-life care. Palliative care to continue to follow through the weekend.  Goals of Care and Additional Recommendations: Limitations on Scope of Treatment: Full Scope Treatment  Code Status:    Code Status Orders  (From admission, onward)         Start     Ordered   05/04/21 0923  Do not attempt resuscitation (DNR)  Continuous       Question Answer Comment  In the event of cardiac or respiratory ARREST Do not call a "code blue"   In the event of cardiac or respiratory ARREST Do not perform Intubation, CPR, defibrillation or ACLS   In the event of cardiac or respiratory ARREST Use medication by any route, position, wound care, and other measures to relive pain and suffering. May use oxygen, suction and manual treatment of airway obstruction as needed for comfort.      05/04/21 8366        Code Status History    Date Active Date Inactive Code Status Order ID Comments User Context   05/03/2021 1717 05/04/2021 0922 Full Code [id number]  Jonnie Finner, DO Inpatient   09/19/2020 1937 09/22/2020 1746 DNR [id number]  Eugenie Filler, MD Inpatient   Advance Care Planning Activity    Advance Directive Documentation   Flowsheet Row Most  Recent Value  Type of Advance Directive Out of facility DNR (pink MOST  or yellow form)  Pre-existing out of facility DNR order (yellow form or pink MOST form) Yellow form placed in chart (order not valid for inpatient use)  "MOST" Form in Place? --      Prognosis:  Guarded/poor  Discharge Planning: To Be Determined  Care plan was discussed with bedside RN Thank you for allowing the Palliative Medicine Team to assist in the care of this patient.   Time In: 1440 Time Out: 1500 Total Time 20 Prolonged Time Billed No   Greater than 50%  of this time was spent counseling and coordinating care related to the above assessment and plan.   Micheline Rough, MD  Please contact Palliative Medicine Team phone at [phone] for questions and concerns.

## 2021-05-09 NOTE — Progress Notes (Signed)
PROGRESS NOTE    Jennifer Arroyo  VEL:[id number] DOB: 01/01/1941 DOA: 05/03/2021 PCP: Pcp, No    Brief Narrative:  Ms. Kasa was admitted to the hospital with working diagnosis of severe sepsis secondary to urinary tract infection, endorgan failure acute metabolic encephalopathy(present on admission).  80 year old female past medical history for dementia, dyslipidemia, hypertension, history of CVA and atrial fibrillation who presented with confusion. Apparently patient sustained a unwitnessed fall, at the facility where she lives. She has been treated for urinary tract infection with Macrobid for about 7 days, she was noted to be agitated and confused. She was transported to the hospital for further evaluation. On her initial physical examination blood pressure 98/55, 111/59, heart rate 74, respiratory 21, oxygen saturation 94%, her lungs are clear to auscultation bilaterally, heart S1-S2, present, rhythmic, soft abdomen, no lower extremity edema.  Sodium 136, potassium 3.5, chloride 92, bicarb 31, glucose 91, BUN 42, creatinine 1.0, white count 7.9, hemoglobin 16.2, hematocrit 47.7, platelets 170. SARS COVID-19 positive  Urinalysis Pacific gravity 1.014, protein 100, RBC> 50,white blood cell> 50.  Head, neck CT no acute changes CT abdomen pelvis no acute changes.  Chest radiograph no infiltrates.  EKG 97 bpm, left axis deviation, left bundle branch block, sinus rhythm, no significant ST segment or T wave changes.  Patient was placed on antibiotic therapy for her urine infection along with antiviral therapy for incidental COVID-19. She developed atrial fibrillation with rapid ventricular response, placed on amiodarone infusion.  Metabolic encephalopathy with poor oral intake, very weak and deconditioned. Palliative Care has been consulted, with recommendations to continue supportive care, if patient with no po intake and continue to decline health, plan for hospice on  06/06.   Her mentation has improved and now taking po medications, crushed with aspiration precautions.    Assessment & Plan:   Principal Problem:   Sepsis secondary to UTI [facility]) Active Problems:   Memory loss   CVA (cerebral vascular accident) ([place])   HTN (hypertension)   Hyperlipidemia   Atrial fibrillation with RVR (HCC)   Pressure injury of skin   Obesity    1. Sepsis due to urinary tract infection (end-organ failure acute metabolic encephalopathy) present on admission.  Sepsis has resolved, patient has completed antibiotic therapy with 3rd generation cephalosporin.   2. Acute metabolic encephalopathy with delirium in the setting of baseline dementia. Hx of CVA.  Her mentation continue to improve, she is able to answer simple questions and follow simple commands.  Taking po meds crushed.  Continue with memantine  Continue aspiration precautions.   3. Atrial fibrillation with RVR.  Telemetry personally reviewed, continue with sinus rhythm with PAC and sinus arrhythmia,  Patient has taken last 2 doses of apixaban with good toleration.  Will transition to amiodarone 200 mg bid for 2 weeks and then 200 mg daily per Cardiology recommendations.  Continue telemetry monitoring.   4, Positive COVID 19 viral infection. Sp remdesivir 3 doses.  Needs 4 more days in order to dc isolation.  No clinical signs of viral pneumonia.   5. Obesity class 1 with dyslipidemia/ stage 2 sacrum pressure ulcer present on admission.Calculated BMI is 30,  Continue statin and wound care .    Patient continue to be at high risk for worsening encephalopathy and atrial fibrillation with RVR   Status is: Inpatient  Remains inpatient appropriate because:IV treatments appropriate due to intensity of illness or inability to take PO   Dispo: The patient is from: Home  Anticipated d/c is to: SNF              Patient currently is not medically stable to d/c.   Difficult to place  patient No   DVT prophylaxis: apixaban   Code Status:   DNR   Family Communication:  I spoke over the phone with the patient's husband about patient's  condition, plan of care, prognosis and all questions were addressed.   Skin Documentation: Pressure Injury 05/03/21 Sacrum Mid Stage 2 -  Partial thickness loss of dermis presenting as a shallow open injury with a red, pink wound bed without slough. (Active)  05/03/21 1700  Location: Sacrum  Location Orientation: Mid  Staging: Stage 2 -  Partial thickness loss of dermis presenting as a shallow open injury with a red, pink wound bed without slough.  Wound Description (Comments):   Present on Admission: Yes     Consultants:   Cardiology       Subjective: Patient is more awake and alert, no nausea or vomiting, no chest pain, palpitations or dyspnea. Tolerating crushed po medications.   Objective: Vitals:   05/09/21 0000 05/09/21 0300 05/09/21 0400 05/09/21 0800  BP: 126/80  (!) 150/90 (!) 177/82  Pulse: 76  78 89  Resp: (!) 21  (!) 21 (!) 25  Temp: 97.9 F (36.6 C) 97.9 F (36.6 C)  98.3 F (36.8 C)  TempSrc: Oral Axillary  Oral  SpO2: 94%  93% (!) 87%  Weight:        Intake/Output Summary (Last 24 hours) at 05/09/2021 0956 Last data filed at 05/09/2021 0350 Gross per 24 hour  Intake 436.73 ml  Output 675 ml  Net -238.27 ml   Filed Weights   05/03/21 1700 05/04/21 0400  Weight: 84.2 kg 86 kg    Examination:   General: Not in pain or dyspnea, deconditioned  Neurology: Awake and alert, positive confusion but not agitation, follows commands and answers to simple questions  E ENT: no pallor, no icterus, oral mucosa moist Cardiovascular: No JVD. S1-S2 present, rhythmic, no gallops, rubs, or murmurs. Trace non pitting lower extremity edema more left than right.  Pulmonary: positive breath sounds bilaterally, adequate air movement, no wheezing, rhonchi or rales. Gastrointestinal. Abdomen soft and no tender Skin. No  rashes Musculoskeletal: no joint deformities     Data Reviewed: I have personally reviewed following labs and imaging studies  CBC: Recent Labs  Lab 05/03/21 1159 05/04/21 0300 05/05/21 0241 05/07/21 0250  WBC 7.9 5.8 6.4 7.8  NEUTROABS 5.9  --   --   --   HGB 16.2* 13.2 13.5 13.6  HCT 47.7* 39.3 41.0 39.6  MCV 96.4 97.5 99.5 96.4  PLT 170 130* 99* 96*   Basic Metabolic Panel: Recent Labs  Lab 05/04/21 0300 05/05/21 0241 05/06/21 0256 05/07/21 0250 05/08/21 0301  NA 137 141 142 142 143  K 4.0 3.4* 3.6 4.7 3.9  CL 105 111 116* 115* 115*  CO2 25 23 21* 19* 20*  GLUCOSE 68* 88 104* 94 92  BUN 32* 27* 23 19 20   CREATININE 1.17* 1.12* 1.11* 1.14* 1.06*  CALCIUM 8.7* 8.2* 8.4* 8.4* 8.6*  MG 2.7* 2.3 2.2 1.8 2.0  PHOS 2.4* 2.6 2.1* 3.4  --    GFR: Estimated Creatinine Clearance: 46.8 mL/min (A) (by C-G formula based on SCr of 1.06 mg/dL (H)). Liver Function Tests: Recent Labs  Lab 05/03/21 1159 05/04/21 0300 05/05/21 0241  AST 86* 59* 53*  ALT 78* 54* 46*  ALKPHOS 94 75 68  BILITOT 0.1* 0.7 0.6  PROT 7.3 5.3* 5.2*  ALBUMIN 3.5 2.5* 2.4*   No results for input(s): LIPASE, AMYLASE in the last 168 hours. No results for input(s): AMMONIA in the last 168 hours. Coagulation Profile: Recent Labs  Lab 05/03/21 1159 05/04/21 0300  INR 1.3* 1.4*   Cardiac Enzymes: No results for input(s): CKTOTAL, CKMB, CKMBINDEX, TROPONINI in the last 168 hours. BNP (last 3 results) No results for input(s): PROBNP in the last 8760 hours. HbA1C: No results for input(s): HGBA1C in the last 72 hours. CBG: Recent Labs  Lab 05/04/21 1148 05/04/21 1532 05/05/21 1223 05/07/21 2358 05/08/21 1945  GLUCAP 92 92 94 78 120*   Lipid Profile: No results for input(s): CHOL, HDL, LDLCALC, TRIG, CHOLHDL, LDLDIRECT in the last 72 hours. Thyroid Function Tests: No results for input(s): TSH, T4TOTAL, FREET4, T3FREE, THYROIDAB in the last 72 hours. Anemia Panel: No results for  input(s): VITAMINB12, FOLATE, FERRITIN, TIBC, IRON, RETICCTPCT in the last 72 hours.    Radiology Studies: I have reviewed all of the imaging during this hospital visit personally     Scheduled Meds: . apixaban  5 mg Oral BID  . Chlorhexidine Gluconate Cloth  6 each Topical Daily  . clotrimazole  1 application Topical BID  . mouth rinse  15 mL Mouth Rinse BID  . memantine  10 mg Oral BID  . rosuvastatin  10 mg Oral q1800  . valACYclovir  500 mg Oral Daily   Continuous Infusions: . amiodarone 30 mg/hr (05/09/21 0813)     LOS: 6 days        Jennifer Arroyo Gerome Apley, MD

## 2021-05-09 NOTE — Progress Notes (Addendum)
Progress Note  Patient Name: Jennifer Arroyo Date of Encounter: 05/09/2021  [facility] Cardiologist: Freada Bergeron, MD   Subjective   Oriented to person and place, denies any chest pain   Inpatient Medications    Scheduled Meds: . apixaban  5 mg Oral BID  . Chlorhexidine Gluconate Cloth  6 each Topical Daily  . clotrimazole  1 application Topical BID  . mouth rinse  15 mL Mouth Rinse BID  . memantine  10 mg Oral BID  . rosuvastatin  10 mg Oral q1800  . valACYclovir  500 mg Oral Daily   Continuous Infusions: . amiodarone 30 mg/hr (05/09/21 0813)   PRN Meds: acetaminophen **OR** acetaminophen, metoprolol tartrate, metoprolol tartrate, ondansetron **OR** ondansetron (ZOFRAN) IV, polyvinyl alcohol   Vital Signs    Vitals:   05/08/21 2000 05/09/21 0000 05/09/21 0300 05/09/21 0400  BP: 128/75 126/80  (!) 150/90  Pulse: 64 76  78  Resp: 18 (!) 21  (!) 21  Temp:  97.9 F (36.6 C) 97.9 F (36.6 C)   TempSrc:  Oral Axillary   SpO2: 96% 94%  93%  Weight:        Intake/Output Summary (Last 24 hours) at 05/09/2021 0831 Last data filed at 05/09/2021 0973 Gross per 24 hour  Intake 436.73 ml  Output 675 ml  Net -238.27 ml   Last 3 Weights 05/04/2021 05/03/2021 02/05/2021  Weight (lbs) 189 lb 9.5 oz 185 lb 10 oz 170 lb  Weight (kg) 86 kg 84.2 kg 77.111 kg      Telemetry    NSR with PACs, rate 90s - Personally Reviewed  ECG    NA - Personally Reviewed  Physical Exam   GEN: chronically ill appearing but in no acute distress Neck: no JVD,  Cardiac: irregular, normal rate; no murmurs Respiratory:  clear to auscultation bilaterally, normal work of breathing GI: soft, nontender MS: no edema Skin: warm and dry, Neuro:  Oriented to person and place   Labs    High Sensitivity Troponin:  No results for input(s): TROPONINIHS in the last 720 hours.    Chemistry Recent Labs  Lab 05/03/21 1159 05/04/21 0300 05/05/21 0241 05/06/21 0256 05/07/21 0250  05/08/21 0301  NA 136 137 141 142 142 143  K 3.5 4.0 3.4* 3.6 4.7 3.9  CL 92* 105 111 116* 115* 115*  CO2 31 25 23  21* 19* 20*  GLUCOSE 91 68* 88 104* 94 92  BUN 42* 32* 27* 23 19 20   CREATININE 1.09* 1.17* 1.12* 1.11* 1.14* 1.06*  CALCIUM 10.3 8.7* 8.2* 8.4* 8.4* 8.6*  PROT 7.3 5.3* 5.2*  --   --   --   ALBUMIN 3.5 2.5* 2.4*  --   --   --   AST 86* 59* 53*  --   --   --   ALT 78* 54* 46*  --   --   --   ALKPHOS 94 75 68  --   --   --   BILITOT 0.1* 0.7 0.6  --   --   --   GFRNONAA 51* 47* 50* 50* 49* 53*  ANIONGAP 13 7 7 5 8 8      Hematology Recent Labs  Lab 05/04/21 0300 05/05/21 0241 05/07/21 0250  WBC 5.8 6.4 7.8  RBC 4.03 4.12 4.11  HGB 13.2 13.5 13.6  HCT 39.3 41.0 39.6  MCV 97.5 99.5 96.4  MCH 32.8 32.8 33.1  MCHC 33.6 32.9 34.3  RDW 13.8 14.4 14.6  PLT 130* 99* 96*    BNPNo results for input(s): BNP, PROBNP in the last 168 hours.   DDimer No results for input(s): DDIMER in the last 168 hours.   Radiology    No results found.  Cardiac Studies     Patient Profile     80 y.o. female with dementia, CVA, atrial fibrillation, hypertension who presented with sepsis secondary to UT, COVID 19 infection complicated by A. fib with RVR.  Assessment & Plan    Atrial fibrillation / flutter -Converted to sinus rhythm.  Continue Eliquis 5 mg twice daily for anticoagulation. Once reliably taking PO would convert IV amiodarone to p.o.  Would recommend amiodarone 200 mg twice daily x2 weeks, then can decrease to 200 mg daily.     For questions or updates, please contact [place] Please consult [email or web address] for contact info under        Signed, Donato Heinz, MD  05/09/2021, 8:31 AM

## 2021-05-09 NOTE — Progress Notes (Signed)
PT transferred to room 1438 via bed by nurse tech and myself. Eda Keys was the receiving RN for this patient. PT vital signs stable, and patient is not in any acute distress. Nighttime meds were given before transfer.

## 2021-05-10 ENCOUNTER — Inpatient Hospital Stay (HOSPITAL_COMMUNITY): Payer: Medicare Other

## 2021-05-10 MED ORDER — FUROSEMIDE 10 MG/ML IJ SOLN
40.0000 mg | Freq: Once | INTRAMUSCULAR | Status: AC
  Administered 2021-05-10: 40 mg via INTRAVENOUS
  Filled 2021-05-10: qty 4

## 2021-05-10 MED ORDER — METOPROLOL TARTRATE 5 MG/5ML IV SOLN
5.0000 mg | Freq: Once | INTRAVENOUS | Status: AC
  Administered 2021-05-10: 5 mg via INTRAVENOUS
  Filled 2021-05-10: qty 5

## 2021-05-10 MED ORDER — METOPROLOL TARTRATE 5 MG/5ML IV SOLN
5.0000 mg | INTRAVENOUS | Status: DC | PRN
  Administered 2021-05-10: 5 mg via INTRAVENOUS
  Filled 2021-05-10: qty 5

## 2021-05-10 MED ORDER — METOPROLOL TARTRATE 5 MG/5ML IV SOLN: 5.0000 mg | INTRAVENOUS | Status: DC | PRN

## 2021-05-10 MED ORDER — CLOTRIMAZOLE 1 % EX CREA: 1.0000 "application " | TOPICAL_CREAM | Freq: Two times a day (BID) | CUTANEOUS | Status: DC

## 2021-05-10 MED ORDER — AMIODARONE HCL IN DEXTROSE 360-4.14 MG/200ML-% IV SOLN
60.0000 mg/h | INTRAVENOUS | Status: DC
  Administered 2021-05-10: 60 mg/h via INTRAVENOUS
  Filled 2021-05-10 (×2): qty 200

## 2021-05-10 MED ORDER — METOPROLOL TARTRATE 5 MG/5ML IV SOLN: 5.0000 mg | Freq: Four times a day (QID) | INTRAVENOUS | Status: DC | PRN

## 2021-05-10 MED ORDER — METOPROLOL TARTRATE 25 MG PO TABS
25.0000 mg | ORAL_TABLET | Freq: Four times a day (QID) | ORAL | Status: DC
  Administered 2021-05-10 – 2021-05-11 (×5): 25 mg via ORAL
  Filled 2021-05-10 (×5): qty 1

## 2021-05-10 MED ORDER — AMIODARONE HCL IN DEXTROSE 360-4.14 MG/200ML-% IV SOLN
30.0000 mg/h | INTRAVENOUS | Status: DC
  Administered 2021-05-10: 30 mg/h via INTRAVENOUS

## 2021-05-10 MED ORDER — SODIUM CHLORIDE 0.9 % IV SOLN
3.0000 g | Freq: Four times a day (QID) | INTRAVENOUS | Status: AC
  Administered 2021-05-10 – 2021-05-15 (×20): 3 g via INTRAVENOUS
  Filled 2021-05-10 (×2): qty 8
  Filled 2021-05-10 (×3): qty 3
  Filled 2021-05-10 (×2): qty 8
  Filled 2021-05-10 (×2): qty 3
  Filled 2021-05-10 (×5): qty 8
  Filled 2021-05-10: qty 3
  Filled 2021-05-10 (×3): qty 8
  Filled 2021-05-10: qty 3
  Filled 2021-05-10: qty 8

## 2021-05-10 NOTE — Progress Notes (Signed)
Pharmacy Antibiotic Note  NHI BUTRUM is a 80 y.o. female admitted on 05/03/2021 with sepsis due to UTI and has completed antibiotic therapy.  Today, patient experiencing persistent dyspnea and congestion and new aspiration pneumonia suspected. Pharmacy has been consulted for Unasyn dosing.  Amoxicillin is listed as an allergy with nausea only as the reaction.  Cefepime tolerated from 5/29 to 6/2.    Plan: Unasyn 3 g IV every 6 hours Monitor clinical picture and renal function F/U LOT   Weight: 86 kg (189 lb 9.5 oz)  Temp (24hrs), Avg:98.7 F (37.1 C), Min:97.9 F (36.6 C), Max:99.3 F (37.4 C)  Recent Labs  Lab 05/04/21 0300 05/05/21 0241 05/06/21 0256 05/07/21 0250 05/08/21 0301  WBC 5.8 6.4  --  7.8  --   CREATININE 1.17* 1.12* 1.11* 1.14* 1.06*    Estimated Creatinine Clearance: 46.8 mL/min (A) (by C-G formula based on SCr of 1.06 mg/dL (H)).    Allergies  Allergen Reactions  . Amoxicillin Nausea Only  . Ceclor [Cefaclor] Hives  . Levaquin [Levofloxacin] Other (See Comments)  . Nystatin Other (See Comments)    Black scale around teeth.     Thank you for allowing pharmacy to be a part of this patient's care.  Keena Dinse P. Legrand Como, PharmD, [place] Please utilize Amion for appropriate phone number to reach the unit pharmacist ([place]) 05/10/2021 4:49 PM

## 2021-05-10 NOTE — Progress Notes (Signed)
Prn lopressor IV given as ordered

## 2021-05-10 NOTE — Progress Notes (Signed)
Telemetry reviewed.  Patient went back into Afib with RVR overnight.  Rates up to 150s.  Convert from PO amiodarone back to amio gtt.  BP elevated and taking PO now, will add metoprolol 25 mg q6hr for rate control.  Continue Eliquis.

## 2021-05-10 NOTE — Progress Notes (Signed)
Shift note: patient HR ranged 120-160s, majority trending in 130s. O2 saturation 88% on 5L of O2 via nasal cannula. MD notified. New order for CxR, IV lasix, and  IV metoprolol. CxR showed pneumonia, started on Unasyn. Unasyn not compatible with amiodarone and patient only has one PIV. Patient has BUE edema and is difficult stick, RN unsuccessful x 2, IV team paged. Unasyn rescheduled from 1800 to 2000, as we await 2nd peripheral IV.   Respiratory therapy placed patient on high flow oxygen, 15 liters, and O2 sat improved to 92-94 %. Respiartions 24, no s/s of distress.   Jennifer Arroyo. Brigitte Pulse, RN

## 2021-05-10 NOTE — Progress Notes (Signed)
PROGRESS NOTE    Jennifer Arroyo  YKD:[id number] DOB: [birth year] DOA: 05/03/2021 PCP: Pcp, No    Brief Narrative:  Jennifer Arroyo was admitted to the hospital with working diagnosis of severe sepsis secondary to urinary tract infection, endorgan failure acute metabolic encephalopathy(present on admission).  80 year old female past medical history for dementia, dyslipidemia, hypertension, history of CVA and atrial fibrillation who presented with confusion. Apparently patient sustained a unwitnessed fall, at the facility where she lives. She has been treated for urinary tract infection with Macrobid for about 7 days, she was noted to be agitated and confused. She was transported to the hospital for further evaluation. On her initial physical examination blood pressure 98/55, 111/59, heart rate 74, respiratory 21, oxygen saturation 94%, her lungs are clear to auscultation bilaterally, heart S1-S2, present, rhythmic, soft abdomen, no lower extremity edema.  Sodium 136, potassium 3.5, chloride 92, bicarb 31, glucose 91, BUN 42, creatinine 1.0, white count 7.9, hemoglobin 16.2, hematocrit 47.7, platelets 170. SARS COVID-19 positive  Urinalysis Pacific gravity 1.014, protein 100, RBC> 50,white blood cell> 50.  Head, neck CT no acute changes CT abdomen pelvis no acute changes.  Chest radiograph no infiltrates.  EKG 97 bpm, left axis deviation, left bundle branch block, sinus rhythm, no significant ST segment or T wave changes.  Patient was placed on antibiotic therapy for her urine infection along with antiviral therapy for incidental COVID-19. She developed atrial fibrillation with rapid ventricular response, placed on amiodarone infusion.  Metabolic encephalopathy with poor oral intake, very weak and deconditioned. Palliative Care has been consulted, with recommendations to continue supportive care, if patient with no po intake and continue to decline health, plan for hospice on  06/06.  Her mentation has improved and now taking po medications, crushed with aspiration precautions.   She converted to sinus rhythm on 06/04, but 06/05 at 4 am converted back to atrial fibrillation with rapid ventricular response.    Assessment & Plan:   Principal Problem:   Sepsis secondary to UTI [facility]) Active Problems:   Memory loss   CVA (cerebral vascular accident) ([place])   HTN (hypertension)   Hyperlipidemia   Atrial fibrillation with RVR (HCC)   Pressure injury of skin   Obesity   1. Sepsis due to urinary tract infection (end-organ failure acute metabolic encephalopathy) present on admission. Completed antibiotic therapy.   2. Acute metabolic encephalopathy with delirium in the setting of baseline dementia.Hx of CVA.  She is awake and alert, continue to be confused but not agitated, this am has mittens on bilateral hands to protect IVs.    Continue neuro checks per unit protocol and aspiration precautions. On memantine.   3. Atrial fibrillation with RVR., acute congested heart failure diastolic.  Patient yesterday transitioned to po amiodarone with good response, but this am at 4 am she converted back to atrial fibrillation with rapid ventricular response.  EKG personally reviewed HR 148 bpm with right axis, left bundle branch block, atrial fibrillation rhythm, no significant ST segment or t wave changes.   This am with HR up to 150 and congested on physical examination. Respiratory rate 36 and oxygen saturation is 92% on 3 L/min per [place].   Plan to resume IV amiodarone and add po metoprolol. IV metoprolol PRN for heart rate more than 120 bpm.  Add 40 mg furosemide today.   Anticoagulation with apixaban.  Continue close telemetry monitoring  Old records personally reviewed echocardiogram from 2021 with LV ef 60 to 65%, positive LVH. Preserved RV  systolic function.   4, Positive COVID 19 viral infection.Sp remdesivir 3 doses.  Plan to discontinue isolation  after 10 days of positive test, on 05/13/21.   5. Obesity class 1 with dyslipidemia/ stage 2 sacrum pressure ulcer present on admission.Calculated BMI is 30, Wound care    Patient continue to be at high risk for worsening atrial fibrillation and heart failure   Status is: Inpatient  Remains inpatient appropriate because:IV treatments appropriate due to intensity of illness or inability to take PO   Dispo: The patient is from: Home              Anticipated d/c is to: SNF              Patient currently is not medically stable to d/c.   Difficult to place patient No  DVT prophylaxis: apixaban   Code Status:   DNR   Family Communication:  No family at the bedside       Skin Documentation: Pressure Injury 05/03/21 Sacrum Mid Stage 2 -  Partial thickness loss of dermis presenting as a shallow open injury with a red, pink wound bed without slough. (Active)  05/03/21 1700  Location: Sacrum  Location Orientation: Mid  Staging: Stage 2 -  Partial thickness loss of dermis presenting as a shallow open injury with a red, pink wound bed without slough.  Wound Description (Comments):   Present on Admission: Yes     Consultants:   Cardiology   Palliative Care    Subjective: Patient is not feeling well today, no nausea or vomiting, still tolerating po well, positive dyspnea at rest. Has mittens bilateral hands, back on atrial fibrillation since 4 am.   Objective: Vitals:   05/10/21 0652 05/10/21 0700 05/10/21 0800 05/10/21 0900  BP: (!) 144/78 (!) 126/111 (!) 153/118 (!) 147/131  Pulse:      Resp: (!) 33 (!) 22 (!) 36   Temp: 98.7 F (37.1 C)  98.3 F (36.8 C)   TempSrc: Axillary  Oral   SpO2: 92%     Weight:        Intake/Output Summary (Last 24 hours) at 05/10/2021 1112 Last data filed at 05/09/2021 2045 Gross per 24 hour  Intake --  Output 300 ml  Net -300 ml   Filed Weights   05/03/21 1700 05/04/21 0400  Weight: 84.2 kg 86 kg    Examination:   General:  positive dyspnea,. Deconditioned and ill looking appearing  Neurology: Awake and alert, non focal  E ENT: no pallor, no icterus, oral mucosa moist Cardiovascular: No JVD. S1-S2 present, rhythmic, no gallops, rubs, or murmurs. No lower extremity edema. Pulmonary: positive breath sounds bilaterally, with no wheezing, positive bilateral rhonchi and rales. Gastrointestinal. Abdomen soft and non tender Skin. No rashes Musculoskeletal: no joint deformities     Data Reviewed: I have personally reviewed following labs and imaging studies  CBC: Recent Labs  Lab 05/03/21 1159 05/04/21 0300 05/05/21 0241 05/07/21 0250  WBC 7.9 5.8 6.4 7.8  NEUTROABS 5.9  --   --   --   HGB 16.2* 13.2 13.5 13.6  HCT 47.7* 39.3 41.0 39.6  MCV 96.4 97.5 99.5 96.4  PLT 170 130* 99* 96*   Basic Metabolic Panel: Recent Labs  Lab 05/04/21 0300 05/05/21 0241 05/06/21 0256 05/07/21 0250 05/08/21 0301  NA 137 141 142 142 143  K 4.0 3.4* 3.6 4.7 3.9  CL 105 111 116* 115* 115*  CO2 25 23 21* 19* 20*  GLUCOSE 68*  88 104* 94 92  BUN 32* 27* 23 19 20   CREATININE 1.17* 1.12* 1.11* 1.14* 1.06*  CALCIUM 8.7* 8.2* 8.4* 8.4* 8.6*  MG 2.7* 2.3 2.2 1.8 2.0  PHOS 2.4* 2.6 2.1* 3.4  --    GFR: Estimated Creatinine Clearance: 46.8 mL/min (A) (by C-G formula based on SCr of 1.06 mg/dL (H)). Liver Function Tests: Recent Labs  Lab 05/03/21 1159 05/04/21 0300 05/05/21 0241  AST 86* 59* 53*  ALT 78* 54* 46*  ALKPHOS 94 75 68  BILITOT 0.1* 0.7 0.6  PROT 7.3 5.3* 5.2*  ALBUMIN 3.5 2.5* 2.4*   No results for input(s): LIPASE, AMYLASE in the last 168 hours. No results for input(s): AMMONIA in the last 168 hours. Coagulation Profile: Recent Labs  Lab 05/03/21 1159 05/04/21 0300  INR 1.3* 1.4*   Cardiac Enzymes: No results for input(s): CKTOTAL, CKMB, CKMBINDEX, TROPONINI in the last 168 hours. BNP (last 3 results) No results for input(s): PROBNP in the last 8760 hours. HbA1C: No results for input(s):  HGBA1C in the last 72 hours. CBG: Recent Labs  Lab 05/04/21 1148 05/04/21 1532 05/05/21 1223 05/07/21 2358 05/08/21 1945  GLUCAP 92 92 94 78 120*   Lipid Profile: No results for input(s): CHOL, HDL, LDLCALC, TRIG, CHOLHDL, LDLDIRECT in the last 72 hours. Thyroid Function Tests: No results for input(s): TSH, T4TOTAL, FREET4, T3FREE, THYROIDAB in the last 72 hours. Anemia Panel: No results for input(s): VITAMINB12, FOLATE, FERRITIN, TIBC, IRON, RETICCTPCT in the last 72 hours.    Radiology Studies: I have reviewed all of the imaging during this hospital visit personally     Scheduled Meds: . apixaban  5 mg Oral BID  . Chlorhexidine Gluconate Cloth  6 each Topical Daily  . clotrimazole  1 application Topical BID  . furosemide  40 mg Intravenous Once  . mouth rinse  15 mL Mouth Rinse BID  . memantine  10 mg Oral BID  . metoprolol tartrate  25 mg Oral QID  . rosuvastatin  10 mg Oral q1800  . valACYclovir  500 mg Oral Daily   Continuous Infusions: . amiodarone 60 mg/hr (05/10/21 0617)  . amiodarone       LOS: 7 days        Shemicka Cohrs Gerome Apley, MD

## 2021-05-10 NOTE — Progress Notes (Signed)
Daily Progress Note   Patient Name: Jennifer Arroyo       Date: 05/10/2021 DOB: 06-23-1941  Age: 80 y.o. MRN#: [id number] Attending Physician: Tawni Millers Primary Care Physician: Pcp, No Admit Date: 05/03/2021  Reason for Consultation/Follow-up: Establishing goals of care  Subjective: I saw and examined Jennifer Arroyo today.  She remains awake and alert.  She seems little less interactive today, but is much improved from a few days ago.  Length of Stay: 7  Current Medications: Scheduled Meds:  . apixaban  5 mg Oral BID  . Chlorhexidine Gluconate Cloth  6 each Topical Daily  . clotrimazole  1 application Topical BID  . mouth rinse  15 mL Mouth Rinse BID  . memantine  10 mg Oral BID  . metoprolol tartrate  25 mg Oral QID  . rosuvastatin  10 mg Oral q1800  . valACYclovir  500 mg Oral Daily    Continuous Infusions: . amiodarone 30 mg/hr (05/10/21 1324)    PRN Meds: acetaminophen **OR** acetaminophen, metoprolol tartrate, ondansetron **OR** ondansetron (ZOFRAN) IV, polyvinyl alcohol  Physical Exam         General:  Awake and alert. HEENT: No bruits, no goiter, no JVD Heart: Irregular.  Tachycardic.Marland Kitchen No murmur appreciated. Lungs: Decreased air movement, clear Abdomen: Soft, nontender, nondistended, positive bowel sounds.  Ext: No significant edema Skin: Warm and dry Neuro: Moves 4 extremities.  No focal deficits noted.  Vital Signs: BP 126/68   Pulse (!) 130   Temp 97.9 F (36.6 C) (Oral)   Resp (!) 30   Wt 86 kg   SpO2 90%   BMI 30.60 kg/m  SpO2: SpO2: 90 % O2 Device: O2 Device: Nasal Cannula O2 Flow Rate: O2 Flow Rate (L/min): 5 L/min  Intake/output summary:   Intake/Output Summary (Last 24 hours) at 05/10/2021 1559 Last data filed at 05/09/2021  2045 Gross per 24 hour  Intake --  Output 300 ml  Net -300 ml   LBM: Last BM Date: 05/09/21 Baseline Weight: Weight: 84.2 kg Most recent weight: Weight: 86 kg       Palliative Assessment/Data:    Flowsheet Rows   Flowsheet Row Most Recent Value  Intake Tab   Referral Department Hospitalist  Unit at Time of Referral Med/Surg Unit  Palliative Care Primary Diagnosis Sepsis/Infectious Disease  Date Notified 05/04/21  Reason for referral Clarify Goals of Care  Date of Admission 05/03/21  Date first seen by Palliative Care 05/05/21  # of days Palliative referral response time 1 Day(s)  # of days IP prior to Palliative referral 1  Clinical Assessment   Palliative Performance Scale Score 30%  Psychosocial & Spiritual Assessment   Palliative Care Outcomes   Patient/Family meeting held? Yes  Who was at the meeting? Husband via phone  Palliative Care Outcomes Clarified goals of care      Patient Active Problem List   Diagnosis Date Noted  . Sepsis secondary to UTI ([place]) 05/07/2021  . Obesity 05/07/2021  . Pressure injury of skin 05/04/2021  . Sepsis ([place]) 05/03/2021  . CVA (cerebral vascular accident) ([place]) 09/19/2020  . New onset a-fib ([place]) 09/19/2020  . HTN (hypertension) 09/19/2020  . Hyperlipidemia 09/19/2020  . Dementia without behavioral disturbance ([place]) 09/19/2020  . Fall at home, initial encounter 09/19/2020  . Bacteria in urine 09/19/2020  . Atrial fibrillation with RVR ([place])   . Memory loss 01/11/2019  . Gait abnormality 01/11/2019    Palliative Care Assessment & Plan   Patient Profile: 80 y.o. female  with past medical history of dementia, hyperlipidemia, hypertension, A. fib, CVA, resident long-term assisted living admitted on 05/03/2021 with fall and altered mental status.  Work-up revealed concern for UTI as well as positive COVID.  Her encephalopathy worsened yesterday but she appears to be improved today.  She has also had A. fib with RVR.  Palliative  consulted for goals of care.  Recommendations/Plan: Continue current interventions.  Discussed today with Dr. Cathlean Sauer and while her prognosis remains guarded, she does continue to be more awake and alert.  We will continue to watch with current interventions through the weekend and reassess in conjunction with her husband tomorrow.  If she continues to improve, we will discuss next steps regarding potential discharge from the hospital and if she would benefit from hospice services long-term.  If she worsens or is not able to maintain enough nutrition and hydration to sustain herself, we discussed likely recommendation for residential hospice for end-of-life care. Palliative care to continue to follow through the weekend.  Goals of Care and Additional Recommendations: Limitations on Scope of Treatment: Full Scope Treatment  Code Status:    Code Status Orders  (From admission, onward)         Start     Ordered   05/04/21 0923  Do not attempt resuscitation (DNR)  Continuous       Question Answer Comment  In the event of cardiac or respiratory ARREST Do not call a "code blue"   In the event of cardiac or respiratory ARREST Do not perform Intubation, CPR, defibrillation or ACLS   In the event of cardiac or respiratory ARREST Use medication by any route, position, wound care, and other measures to relive pain and suffering. May use oxygen, suction and manual treatment of airway obstruction as needed for comfort.      05/04/21 3704        Code Status History    Date Active Date Inactive Code Status Order ID Comments User Context   05/03/2021 1717 05/04/2021 0922 Full Code [id number]  Jonnie Finner, DO Inpatient   09/19/2020 1937 09/22/2020 1746 DNR [id number]  Eugenie Filler, MD Inpatient   Advance Care Planning Activity    Advance Directive Documentation   Flowsheet Row Most Recent Value  Type of Advance Directive Out of facility  DNR (pink MOST or yellow form)  Pre-existing out of  facility DNR order (yellow form or pink MOST form) Yellow form placed in chart (order not valid for inpatient use)  "MOST" Form in Place? --      Prognosis:  Guarded/poor  Discharge Planning: To Be Determined  Care plan was discussed with Dr. Cathlean Sauer  Thank you for allowing the Palliative Medicine Team to assist in the care of this patient.   Time In: 1100 Time Out: 1120 Total Time 20 Prolonged Time Billed No   Greater than 50%  of this time was spent counseling and coordinating care related to the above assessment and plan.  Micheline Rough, MD  Please contact Palliative Medicine Team phone at [phone] for questions and concerns.

## 2021-05-10 NOTE — Progress Notes (Addendum)
Notified by tele tech that pt had converted back to NSR at around [place]. One hour post patient continues to NSR. Pt still on amiodarone gtt at this time. Notified on call provider to update on patients rhythm status and to see what interventions to implement next. Awaiting response. Will continue to monitor ECG and vitals.   2115 Communicated with NP, amiodarone gtt stopped at this time. Pt to get scheduled beta blocker. Confirmed that patient had prn beta blocker ordered in the event patient went back into Afib RVR.  Tele tech updated of the changes as well. Will continue to monitor. .  0000 questioned on call provider regarding restarting amiodarone po. Awaiting response. No orders for amiodarone at this time from oncall provider. Pt on lopressor TID currently. No changes to Meds made at this time

## 2021-05-10 NOTE — Progress Notes (Signed)
   05/10/21 3244  Assess: MEWS Score  Temp 98.7 F (37.1 C)  BP (!) 144/78  ECG Heart Rate (!) 149  Resp (!) 33  Level of Consciousness Alert  SpO2 92 %  O2 Device Nasal Cannula  O2 Flow Rate (L/min) 3 L/min  Assess: MEWS Score  MEWS Temp 0  MEWS Systolic 0  MEWS Pulse 3  MEWS RR 2  MEWS LOC 0  MEWS Score 5  MEWS Score Color Red  Assess: if the MEWS score is Yellow or Red  Were vital signs taken at a resting state? Yes  Focused Assessment No change from prior assessment  Does the patient meet 2 or more of the SIRS criteria? Yes  Does the patient have a confirmed or suspected source of infection? Yes  Treat  Pain Scale PAINAD  Breathing 0  Negative Vocalization 0  Facial Expression 0  Body Language 0  Consolability 0  PAINAD Score 0  Assess: SIRS CRITERIA  SIRS Temperature  0  SIRS Pulse 1  SIRS Respirations  1  SIRS WBC 0  SIRS Score Sum  2  Pt currently awake and restless. HR uncontrolled. Started on amnio drip as ordered. MEWS fluctuating depending on altering HR. Charge nurse is aware. Pt placed on MX monitor and set for hourly vitals. Will continue monitor and endorse to oncoming RN.

## 2021-05-10 NOTE — Progress Notes (Addendum)
Patient with persistent dyspnea and congestion despite furosemide.  Increased oxygen requirements to 5 L/min per [place], oxygen saturation 90% with respiratory rate 30.   Chest film personally reviewed noted right upper lobe, right lower lobe and left lower lobe infiltrate.  Suspected new aspiration pneumonia.  Plan:  Start patient with Unasyn Continue oxymetry monitoring and supplemental 02 per [place], keep oxygenation greater than 92%.

## 2021-05-11 ENCOUNTER — Telehealth: Payer: Self-pay | Admitting: Student in an Organized Health Care Education/Training Program

## 2021-05-11 LAB — BASIC METABOLIC PANEL
Anion gap: 8 (ref 5–15)
BUN: 23 mg/dL (ref 8–23)
CO2: 23 mmol/L (ref 22–32)
Calcium: 8.2 mg/dL — ABNORMAL LOW (ref 8.9–10.3)
Chloride: 114 mmol/L — ABNORMAL HIGH (ref 98–111)
Creatinine, Ser: 1.22 mg/dL — ABNORMAL HIGH (ref 0.44–1.00)
GFR, Estimated: 45 mL/min — ABNORMAL LOW (ref >60)
Glucose, Bld: 89 mg/dL (ref 70–99)
Potassium: 3.6 mmol/L (ref 3.5–5.1)
Sodium: 145 mmol/L (ref 135–145)

## 2021-05-11 LAB — MAGNESIUM: Magnesium: 1.7 mg/dL (ref 1.7–2.4)

## 2021-05-11 MED ORDER — AMIODARONE HCL 200 MG PO TABS
200.0000 mg | ORAL_TABLET | Freq: Two times a day (BID) | ORAL | Status: DC
  Administered 2021-05-11 – 2021-05-15 (×9): 200 mg via ORAL
  Filled 2021-05-11 (×9): qty 1

## 2021-05-11 MED ORDER — METOPROLOL TARTRATE 50 MG PO TABS
50.0000 mg | ORAL_TABLET | Freq: Two times a day (BID) | ORAL | Status: DC
  Administered 2021-05-12 – 2021-05-15 (×7): 50 mg via ORAL
  Filled 2021-05-11 (×7): qty 1

## 2021-05-11 MED ORDER — METOPROLOL TARTRATE 25 MG PO TABS
25.0000 mg | ORAL_TABLET | Freq: Two times a day (BID) | ORAL | Status: DC
  Administered 2021-05-11: 25 mg via ORAL
  Filled 2021-05-11: qty 1

## 2021-05-11 MED ORDER — METOPROLOL TARTRATE 5 MG/5ML IV SOLN
5.0000 mg | INTRAVENOUS | Status: AC | PRN
  Administered 2021-05-11 (×2): 5 mg via INTRAVENOUS
  Filled 2021-05-11 (×2): qty 5

## 2021-05-11 NOTE — Plan of Care (Signed)
  Problem: Education: Goal: Knowledge of General Education information will improve Description: Including pain rating scale, medication(s)/side effects and non-pharmacologic comfort measures 05/11/2021 1118 by Marcella Dubs, RN Outcome: Progressing 05/11/2021 1117 by Marcella Dubs, RN Outcome: Progressing   Problem: Clinical Measurements: Goal: Will remain free from infection 05/11/2021 1118 by Marcella Dubs, RN Outcome: Progressing 05/11/2021 1117 by Marcella Dubs, RN Outcome: Progressing Goal: Diagnostic test results will improve Outcome: Progressing Goal: Respiratory complications will improve Outcome: Progressing   Problem: Nutrition: Goal: Adequate nutrition will be maintained Outcome: Progressing

## 2021-05-11 NOTE — Telephone Encounter (Signed)
HR 140-150s, given night time PO meds.   HR (99) currently in AF. Was in 140-150s. S/p metop tartrate 5 mg IV (x2) and metop tartrate 25 mg PO (ordered for bid from q6h) today. Restarted on amio 200 mg PO bid currently. Continued on eliquis 5 mg PO bid. Will plan to increase metop tartrate to 50 mg PO bid and see how her rates do on this, first dose of increased metop will be 06/07 AM since she already received her scheduled metop tartrate 25 mg PO nighttime dose along with 2 IV doses.

## 2021-05-11 NOTE — Care Management Important Message (Signed)
Important Message  Patient Details IM Letter placed in door caddy. Name: Jennifer Arroyo MRN: [id number] Date of Birth: 1941/06/09   Medicare Important Message Given:  Yes     Kerin Salen 05/11/2021, 12:31 PM

## 2021-05-11 NOTE — Progress Notes (Signed)
Daily Progress Note   Patient Name: Jennifer Arroyo       Date: 05/11/2021 DOB: 03-08-1941  Age: 80 y.o. MRN#: [id number] Attending Physician: Tawni Millers Primary Care Physician: Pcp, No Admit Date: 05/03/2021  Reason for Consultation/Follow-up: Establishing goals of care  Subjective: I saw and examined Jennifer Arroyo today. She is sleepier today than she has been the past couple of days.  I met with her husband today.  See below.  Length of Stay: 8  Current Medications: Scheduled Meds:  . amiodarone  200 mg Oral BID  . apixaban  5 mg Oral BID  . clotrimazole  1 application Topical BID  . mouth rinse  15 mL Mouth Rinse BID  . memantine  10 mg Oral BID  . [START ON 05/12/2021] metoprolol tartrate  50 mg Oral BID  . rosuvastatin  10 mg Oral q1800  . valACYclovir  500 mg Oral Daily    Continuous Infusions: . ampicillin-sulbactam (UNASYN) IV 3 g (05/11/21 1808)    PRN Meds: acetaminophen **OR** acetaminophen, ondansetron **OR** ondansetron (ZOFRAN) IV, polyvinyl alcohol  Physical Exam         General:  Sleepier again today. HEENT: No bruits, no goiter, no JVD Heart: Irregular.  Tachycardic.Marland Kitchen No murmur appreciated. Lungs: Decreased air movement, clear Abdomen: Soft, nontender, nondistended, positive bowel sounds.  Ext: No significant edema Skin: Warm and dry Neuro: Moves 4 extremities.  No focal deficits noted.  Vital Signs: BP 111/72   Pulse (!) 147   Temp 98.4 F (36.9 C)   Resp 20   Wt 86 kg   SpO2 90%   BMI 30.60 kg/m  SpO2: SpO2: 90 % O2 Device: O2 Device: High Flow Nasal Cannula O2 Flow Rate: O2 Flow Rate (L/min): 15 L/min  Intake/output summary:   Intake/Output Summary (Last 24 hours) at 05/11/2021 2358 Last data filed at 05/11/2021 1859 Gross per  24 hour  Intake 740 ml  Output 900 ml  Net -160 ml   LBM: Last BM Date: 05/09/21 Baseline Weight: Weight: 84.2 kg Most recent weight: Weight: 86 kg       Palliative Assessment/Data:    Flowsheet Rows   Flowsheet Row Most Recent Value  Intake Tab   Referral Department Hospitalist  Unit at Time of Referral Med/Surg Unit  Palliative Care Primary Diagnosis  Sepsis/Infectious Disease  Date Notified 05/04/21  Reason for referral Clarify Goals of Care  Date of Admission 05/03/21  Date first seen by Palliative Care 05/05/21  # of days Palliative referral response time 1 Day(s)  # of days IP prior to Palliative referral 1  Clinical Assessment   Palliative Performance Scale Score 30%  Psychosocial & Spiritual Assessment   Palliative Care Outcomes   Patient/Family meeting held? Yes  Who was at the meeting? Husband via phone  Palliative Care Outcomes Clarified goals of care      Patient Active Problem List   Diagnosis Date Noted  . Sepsis secondary to UTI ([place]) 05/07/2021  . Obesity 05/07/2021  . Pressure injury of skin 05/04/2021  . Sepsis ([place]) 05/03/2021  . CVA (cerebral vascular accident) ([place]) 09/19/2020  . New onset a-fib ([place]) 09/19/2020  . HTN (hypertension) 09/19/2020  . Hyperlipidemia 09/19/2020  . Dementia without behavioral disturbance ([place]) 09/19/2020  . Fall at home, initial encounter 09/19/2020  . Bacteria in urine 09/19/2020  . Atrial fibrillation with RVR ([place])   . Memory loss 01/11/2019  . Gait abnormality 01/11/2019    Palliative Care Assessment & Plan   Patient Profile: 80 y.o. female  with past medical history of dementia, hyperlipidemia, hypertension, A. fib, CVA, resident long-term assisted living admitted on 05/03/2021 with fall and altered mental status.  Work-up revealed concern for UTI as well as positive COVID.  Her encephalopathy worsened yesterday but she appears to be improved today.  She has also had A. fib with RVR.  Palliative consulted for  goals of care.  Recommendations/Plan: Continue current interventions.  Discussed options with her husband today, and he does not believe that trial of rehab is likely to add quality time to her life.  We discussed that I did not think that she would likely qualify for residential hospice at this point, but I did think that she would be a good candidate for hospice services when she leaves the hospital.  He is open to hospice moving forward and we discussed requesting care management reach out to [facility] to see if they would be able to meet her needs if she returned with hospice services. Husband preference is to see if she could return to [facility] with hospice services.  Appreciate TOC exploring possibility.  She will likely need to complete another couple days of isolation for positive COVID test.  If, during this time, she worsens, we discussed revisiting possible residential hospice for end-of-life care.  Goals of Care and Additional Recommendations: Limitations on Scope of Treatment: Full Scope Treatment  Code Status:    Code Status Orders  (From admission, onward)         Start     Ordered   05/04/21 0923  Do not attempt resuscitation (DNR)  Continuous       Question Answer Comment  In the event of cardiac or respiratory ARREST Do not call a "code blue"   In the event of cardiac or respiratory ARREST Do not perform Intubation, CPR, defibrillation or ACLS   In the event of cardiac or respiratory ARREST Use medication by any route, position, wound care, and other measures to relive pain and suffering. May use oxygen, suction and manual treatment of airway obstruction as needed for comfort.      05/04/21 5465        Code Status History    Date Active Date Inactive Code Status Order ID Comments User Context   05/03/2021 1717 05/04/2021  2334 Full Code [id number]  Jonnie Finner DO Inpatient   09/19/2020 1937 09/22/2020 1746 DNR [id number]  Eugenie Filler, MD Inpatient    Advance Care Planning Activity    Advance Directive Documentation   Flowsheet Row Most Recent Value  Type of Advance Directive Out of facility DNR (pink MOST or yellow form)  Pre-existing out of facility DNR order (yellow form or pink MOST form) Yellow form placed in chart (order not valid for inpatient use)  "MOST" Form in Place? --      Prognosis:  Guarded/poor  Discharge Planning: To Be Determined- Memory care with hospice if it can be arranged.  Care plan was discussed with Dr. Cathlean Sauer, husband, bedside RN, case manager  Thank you for allowing the Palliative Medicine Team to assist in the care of this patient.   Time In: 5208 Time Out: 1525 Total Time 40 Prolonged Time Billed No  Greater than 50%  of this time was spent counseling and coordinating care related to the above assessment and plan.  Micheline Rough, MD  Please contact Palliative Medicine Team phone at [phone] for questions and concerns.

## 2021-05-11 NOTE — Progress Notes (Signed)
PROGRESS NOTE    Jennifer Arroyo  XBW:[id number] DOB: Apr 04, 1941 DOA: 05/03/2021 PCP: Pcp, No    Brief Narrative:  Jennifer Arroyo was admitted to the hospital with working diagnosis of severe sepsis secondary to urinary tract infection, endorgan failure acute metabolic encephalopathy(present on admission).  80 year old female past medical history for dementia, dyslipidemia, hypertension, history of CVA and atrial fibrillation who presented with confusion. Apparently patient sustained a unwitnessed fall, at the facility where she lives. She has been treated for urinary tract infection with Macrobid for about 7 days, she was noted to be agitated and confused. She was transported to the hospital for further evaluation. On her initial physical examination blood pressure 98/55, 111/59, heart rate 74, respiratory 21, oxygen saturation 94%, her lungs are clear to auscultation bilaterally, heart S1-S2, present, rhythmic, soft abdomen, no lower extremity edema.  Sodium 136, potassium 3.5, chloride 92, bicarb 31, glucose 91, BUN 42, creatinine 1.0, white count 7.9, hemoglobin 16.2, hematocrit 47.7, platelets 170. SARS COVID-19 positive  Urinalysis [place] gravity 1.014, protein 100, RBC> 50,white blood cell> 50.  Head, neck CT no acute changes CT abdomen pelvis no acute changes.  Chest radiograph no infiltrates.  EKG 97 bpm, left axis deviation, left bundle branch block, sinus rhythm, no significant ST segment or T wave changes.  Patient was placed on antibiotic therapy for her urine infection along with antiviral therapy for incidental COVID-19. She developed atrial fibrillation with rapid ventricular response, placed on amiodarone infusion.  Metabolic encephalopathy with poor oral intake, very weak and deconditioned. Palliative Care has been consulted, with recommendations to continue supportive care, if patient with no po intake and continue to decline health, plan for hospice on  06/06.  Her mentation has improved and now taking po medications, crushed with aspiration precautions.  She converted to sinus rhythm on 06/04, but 06/05 at 4 am converted back to atrial fibrillation with rapid ventricular response.   06/05 developed aspiration pneumonia, multilobar with worsening hypoxemic respiratory failure, increase oxygen requirements with high flow nasal cannula.   06/06 back on sinus rhythm.   Assessment & Plan:   Principal Problem:   Sepsis secondary to UTI [facility]) Active Problems:   Memory loss   CVA (cerebral vascular accident) ([place])   HTN (hypertension)   Hyperlipidemia   Atrial fibrillation with RVR (HCC)   Pressure injury of skin   Obesity    1. Sepsis due to urinary tract infection (end-organ failure acute metabolic encephalopathy) present on admission. Completed antibiotic therapy.   2. Acute metabolic encephalopathy with delirium in the setting of baseline dementia.Hx of CVA. Continue to be more awake and tolerating po medications, she has remained with mittens on bilateral hands to protect IVs.    Continue with memantine.  Aspiration precautions and neuro checks per unit protocol.   3. Atrial fibrillation with RVR., acute congested heart failure diastolic.  Old records personally reviewed echocardiogram from 2021 with LV ef 60 to 65%, positive LVH. Preserved RV systolic function.   Patient now is back on sinus rhythm, continue rate control with metoprolol 25 mg bid and amiodarone 200 mg bid.  Continue anticoagulation with apixaban Continue with telemetry monitoring  4. Aspiration pneumonia with acute hypoxemic respiratory failure. Chest film with multilobar infiltrates. This am on 15 L/ HFNC with oxygen saturation 100%  Plant to continue antibiotic therapy with Unasyn and wean supplemental 02 to as tolerated to target 02 saturation 92% or greater.  Continue aspiration precautions, dysphagia 1 diet.   4, Positive COVID  19 viral  infection.Sp remdesivir 3 doses. Plan to discontinue isolation after 10 days of positive test, on 05/13/21.   No clinical signs of viral pneumonia.   5. Obesity class 1 with dyslipidemia/ stage 2 sacrum pressure ulcer present on admission.Calculated BMI is 30, Continue with wound care.   On rosuvastatin.    Patient continue to be at high risk for worsening respiratory failure.   Status is: Inpatient  Remains inpatient appropriate because:Inpatient level of care appropriate due to severity of illness   Dispo: The patient is from: Home              Anticipated d/c is to: SNF              Patient currently is not medically stable to d/c.   Difficult to place patient No   DVT prophylaxis: apixaban   Code Status:   DNR  Family Communication:  No family at the bedside       Skin Documentation: Pressure Injury 05/03/21 Sacrum Mid Stage 2 -  Partial thickness loss of dermis presenting as a shallow open injury with a red, pink wound bed without slough. (Active)  05/03/21 1700  Location: Sacrum  Location Orientation: Mid  Staging: Stage 2 -  Partial thickness loss of dermis presenting as a shallow open injury with a red, pink wound bed without slough.  Wound Description (Comments):   Present on Admission: Yes     Consultants:   Palliative Care  Cardiology   Antimicrobials:   Unasyn     Subjective: Patient is feeling better, dyspnea and congestion are improving, no chest pain, no nausea or vomiting. Continue confused and disorientated, mittens bilateral hands.   Objective: Vitals:   05/10/21 1836 05/10/21 2000 05/11/21 0000 05/11/21 0400  BP: 100/84 (!) 106/59 (!) 96/48 114/64  Pulse: (!) 113 73  60  Resp:  (!) 23 (!) 22 19  Temp:   99 F (37.2 C) 99.1 F (37.3 C)  TempSrc:   Axillary Axillary  SpO2:   92% 97%  Weight:        Intake/Output Summary (Last 24 hours) at 05/11/2021 0852 Last data filed at 05/11/2021 0300 Gross per 24 hour  Intake 645.35 ml   Output 1800 ml  Net -1154.65 ml   Filed Weights   05/03/21 1700 05/04/21 0400  Weight: 84.2 kg 86 kg    Examination:   General: Not in pain or dyspnea, deconditioned  Neurology: Awake and alert, non focal  E ENT: mild pallor, no icterus, oral mucosa moist Cardiovascular: No JVD. S1-S2 present, rhythmic, no gallops, rubs, or murmurs. Trace non pitting bilateral lower extremity edema. Pulmonary: positive breath sounds bilaterally, with no wheezing, rhonchi or rales. Gastrointestinal. Abdomen soft and non tender Skin. No rashes Musculoskeletal: no joint deformities     Data Reviewed: I have personally reviewed following labs and imaging studies  CBC: Recent Labs  Lab 05/05/21 0241 05/07/21 0250  WBC 6.4 7.8  HGB 13.5 13.6  HCT 41.0 39.6  MCV 99.5 96.4  PLT 99* 96*   Basic Metabolic Panel: Recent Labs  Lab 05/05/21 0241 05/06/21 0256 05/07/21 0250 05/08/21 0301 05/11/21 0400  NA 141 142 142 143 145  K 3.4* 3.6 4.7 3.9 3.6  CL 111 116* 115* 115* 114*  CO2 23 21* 19* 20* 23  GLUCOSE 88 104* 94 92 89  BUN 27* 23 19 20 23   CREATININE 1.12* 1.11* 1.14* 1.06* 1.22*  CALCIUM 8.2* 8.4* 8.4* 8.6* 8.2*  MG  2.3 2.2 1.8 2.0 1.7  PHOS 2.6 2.1* 3.4  --   --    GFR: Estimated Creatinine Clearance: 40.6 mL/min (A) (by C-G formula based on SCr of 1.22 mg/dL (H)). Liver Function Tests: Recent Labs  Lab 05/05/21 0241  AST 53*  ALT 46*  ALKPHOS 68  BILITOT 0.6  PROT 5.2*  ALBUMIN 2.4*   No results for input(s): LIPASE, AMYLASE in the last 168 hours. No results for input(s): AMMONIA in the last 168 hours. Coagulation Profile: No results for input(s): INR, PROTIME in the last 168 hours. Cardiac Enzymes: No results for input(s): CKTOTAL, CKMB, CKMBINDEX, TROPONINI in the last 168 hours. BNP (last 3 results) No results for input(s): PROBNP in the last 8760 hours. HbA1C: No results for input(s): HGBA1C in the last 72 hours. CBG: Recent Labs  Lab 05/04/21 1148  05/04/21 1532 05/05/21 1223 05/07/21 2358 05/08/21 1945  GLUCAP 92 92 94 78 120*   Lipid Profile: No results for input(s): CHOL, HDL, LDLCALC, TRIG, CHOLHDL, LDLDIRECT in the last 72 hours. Thyroid Function Tests: No results for input(s): TSH, T4TOTAL, FREET4, T3FREE, THYROIDAB in the last 72 hours. Anemia Panel: No results for input(s): VITAMINB12, FOLATE, FERRITIN, TIBC, IRON, RETICCTPCT in the last 72 hours.    Radiology Studies: I have reviewed all of the imaging during this hospital visit personally     Scheduled Meds: . apixaban  5 mg Oral BID  . clotrimazole  1 application Topical BID  . mouth rinse  15 mL Mouth Rinse BID  . memantine  10 mg Oral BID  . metoprolol tartrate  25 mg Oral QID  . rosuvastatin  10 mg Oral q1800  . valACYclovir  500 mg Oral Daily   Continuous Infusions: . amiodarone Stopped (05/10/21 2110)  . ampicillin-sulbactam (UNASYN) IV 3 g (05/11/21 0815)     LOS: 8 days        Alphonzo Devera Gerome Apley, MD

## 2021-05-11 NOTE — Plan of Care (Signed)
  Problem: Safety: Goal: Non-violent Restraint(s) Outcome: Progressing   Problem: Safety: Goal: Ability to remain free from injury will improve Outcome: Progressing   Problem: Coping: Goal: Level of anxiety will decrease Outcome: Not Progressing

## 2021-05-11 NOTE — Progress Notes (Addendum)
Telemetry reviewed. She converted to NSR at approximately 1917 last evening without a significant post-conversion pause, but PVCs. Amiodarone gtt was stopped after 1 hr in NSR. She remains on metoprolol 25 mg q6hr.   NSR today in the 60s.  Continue metoprolol - would reduce back to 25 mg BID for HR in the 60s. Restart amiodarone 200 mg BID today.  Continue stroke PPX with eliquis 5 mg BID.  Monitor renal function  -sCr 1.22.   Ledora Bottcher, PA-C 05/11/2021, 9:29 AM [place],  [id number]  Patient on COVID precautions Agree with above NSR with LBBB this am Medication changes made  Jenkins Rouge MD [facility]

## 2021-05-12 LAB — CBC WITH DIFFERENTIAL/PLATELET
Abs Immature Granulocytes: 0.07 10*3/uL (ref 0.00–0.07)
Basophils Absolute: 0 10*3/uL (ref 0.0–0.1)
Basophils Relative: 0 %
Eosinophils Absolute: 0.2 10*3/uL (ref 0.0–0.5)
Eosinophils Relative: 2 %
HCT: 39 % (ref 36.0–46.0)
Hemoglobin: 13 g/dL (ref 12.0–15.0)
Immature Granulocytes: 1 %
Lymphocytes Relative: 12 %
Lymphs Abs: 1.1 10*3/uL (ref 0.7–4.0)
MCH: 32.8 pg (ref 26.0–34.0)
MCHC: 33.3 g/dL (ref 30.0–36.0)
MCV: 98.5 fL (ref 80.0–100.0)
Monocytes Absolute: 0.8 10*3/uL (ref 0.1–1.0)
Monocytes Relative: 8 %
Neutro Abs: 7 10*3/uL (ref 1.7–7.7)
Neutrophils Relative %: 77 %
Platelets: 261 10*3/uL (ref 150–400)
RBC: 3.96 MIL/uL (ref 3.87–5.11)
RDW: 14.5 % (ref 11.5–15.5)
WBC: 9.1 10*3/uL (ref 4.0–10.5)
nRBC: 0 % (ref 0.0–0.2)

## 2021-05-12 LAB — BASIC METABOLIC PANEL
Anion gap: 8 (ref 5–15)
BUN: 20 mg/dL (ref 8–23)
CO2: 24 mmol/L (ref 22–32)
Calcium: 8.1 mg/dL — ABNORMAL LOW (ref 8.9–10.3)
Chloride: 112 mmol/L — ABNORMAL HIGH (ref 98–111)
Creatinine, Ser: 0.96 mg/dL (ref 0.44–1.00)
GFR, Estimated: 60 mL/min — ABNORMAL LOW (ref >60)
Glucose, Bld: 97 mg/dL (ref 70–99)
Potassium: 2.9 mmol/L — ABNORMAL LOW (ref 3.5–5.1)
Sodium: 144 mmol/L (ref 135–145)

## 2021-05-12 MED ORDER — POTASSIUM CHLORIDE 20 MEQ PO PACK
40.0000 meq | PACK | Freq: Once | ORAL | Status: DC
  Filled 2021-05-12: qty 2

## 2021-05-12 MED ORDER — POTASSIUM CHLORIDE CRYS ER 20 MEQ PO TBCR
40.0000 meq | EXTENDED_RELEASE_TABLET | Freq: Once | ORAL | Status: AC
  Administered 2021-05-12: 40 meq via ORAL
  Filled 2021-05-12: qty 2

## 2021-05-12 NOTE — Progress Notes (Signed)
   05/12/21 0800  Vitals  Temp 98.2 F (36.8 C)  Temp Source Axillary  BP 130/83  MAP (mmHg) 95  BP Location Right Arm  BP Method Automatic  Patient Position (if appropriate) Lying  Pulse Rate 72  Pulse Rate Source Monitor  ECG Heart Rate 72  Resp (!) 23  Level of Consciousness  Level of Consciousness Alert  MEWS COLOR  MEWS Score Color Green  Oxygen Therapy  SpO2 91 %  O2 Device HFNC  O2 Flow Rate (L/min) 15 L/min  MEWS Score  MEWS Temp 0  MEWS Systolic 0  MEWS Pulse 0  MEWS RR 1  MEWS LOC 0  MEWS Score 1     Patient not transitioned to red mews over night. She was in afib RVR from 1930-0700. Patient is now back in SR with LBB and is back to green mews.

## 2021-05-12 NOTE — Progress Notes (Signed)
Speech Language Pathology Treatment: Dysphagia  Patient Details Name: Jennifer Arroyo MRN: [id number] DOB: 12-23-1940 Today's Date: 05/12/2021 Time: 1220-1230 SLP Time Calculation (min) (ACUTE ONLY): 10 min  Assessment / Plan / Recommendation Clinical Impression  Skilled SLP to assess tolerance of po diet, establish effective compensation strategies to mitigate risk.  Pt alert, being fed by NT, Jennifer Arroyo.  She is currently on oxygen and her nose drips throughout the meal.  SLP faciliated intake with hand over hand assist with intake of pureed pears, tea and orange juice.    Due to pt's cognitive deficits, she tends to hold bolus in anterior oral cavity without transiting.  Dry spoon pressure to tongue used to aid in motor planning/initiation of swallow - which was effective 3/4 trials. At other times, pt responds to verbal cues to swallow - albeit delayed.    Hand over hand assist provided to aid proprioception and motor planning.  No indication of aspiration with pureed boluses - but intermittent cough noted post-swallow of liquids despite alternating administration from straw to tsp bolus size.    Toward end of meal, pt with dyspnea - with RR up to 26= thus SLP ceased her po intake to decrease swallow/respiratory dysynchrony that may lead to episodic aspiration.  Pt also with frequent belching, thus recommend esophageal precautions be followed.  Continue diet with strict precautions. Note results of her CXR 6/5 concerning for multifocal pna.    Per notes, pt will follow up with hospice at her facility.  Given her mentation is much improved - her swallowing function clinically is better. Will follow up briefly for education with pt's family and continued po tolerance assessment, instrumental assessment indication now that pt is consuming po intake.  Her aspiration risk will remain chronic due to her mentation, delayed swallow, and reliance on others for feeding but airway protection can be maximized  with strategies.  Pt is making significant progress since this SLP's prior visit.   HPI HPI: Pt is an 80 yo pt with dementia, brough in after being found down at her facility.  Per Md notes, pt potentially had a fall.  No apparent head injury.  Facility reports she had been increasingly agitated recently and ?'d UTI.  She was treated for UTI but agitation did not improve.  PMH + for hyperlipidemia, hypertension, A. 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  She is found to be COVID+ and is started on remdesivir.  Imaging studies negative.  Pt has not undergone prior SLP swallow evaluation.  Pt seen by neurology -in 2018 - per office note, pt with progressive memory deficits and spouse desired to to have people come in to help take care of her.      SLP Plan  Continue with current plan of care       Recommendations  Diet recommendations: Dysphagia 1 (puree);Thin liquid Liquids provided via: Cup;Straw Medication Administration: Crushed with puree Supervision: Staff to assist with self feeding;Full supervision/cueing for compensatory strategies Compensations: Slow rate;Small sips/bites;Minimize environmental distractions Postural Changes and/or Swallow Maneuvers: Seated upright 90 degrees;Upright 30-60 min after meal (delayed swallow, dry spoon pressure to tongue prn, hand over hand assist to eat)                Oral Care Recommendations: Oral care BID Follow up Recommendations: Skilled Nursing facility SLP Visit Diagnosis: Dysphagia, unspecified (R13.10) Plan: Continue with current plan of care  [place], Jennifer Arroyo 05/12/2021, 3:22 PM   Kathleen Lime, MS [facility] SLP Acute Rehab Services Office [phone] Pager [phone]

## 2021-05-12 NOTE — Progress Notes (Signed)
Review of telemetry shows more PAF overnight Received iv metoprolol and has converted to NSR With LBBB this am Continue 200 mg bid amiodarone and 50 mg bid lopressor Anticoagulation with eliquis  May benefit from 21 day Zio Patch monitor on d/c   Jenkins Rouge MD [facility]

## 2021-05-12 NOTE — Plan of Care (Signed)
  Problem: Education: Goal: Knowledge of General Education information will improve Description: Including pain rating scale, medication(s)/side effects and non-pharmacologic comfort measures Outcome: Progressing   Problem: Clinical Measurements: Goal: Diagnostic test results will improve Outcome: Progressing Goal: Respiratory complications will improve Outcome: Progressing   Problem: Activity: Goal: Risk for activity intolerance will decrease Outcome: Progressing   Problem: Pain Managment: Goal: General experience of comfort will improve Outcome: Progressing

## 2021-05-12 NOTE — Progress Notes (Signed)
PROGRESS NOTE    Jennifer Arroyo  IRW:[id number] DOB: Apr 08, 1941 DOA: 05/03/2021 PCP: Pcp, No    Brief Narrative:  Ms. Mcfall was admitted to the hospital with working diagnosis of severe sepsis secondary to urinary tract infection, endorgan failure acute metabolic encephalopathy(present on admission). Prolonged hospitalization due to incidental COVID 19 viral infection, atrial fibrillation with RVR, diastolic heart failure decompensation and aspiration penumonia.   80 year old female past medical history for dementia, dyslipidemia, hypertension, history of CVA and atrial fibrillation who presented with confusion. Apparently patient sustained a unwitnessed fall, at the facility where she lives. She has been treated for urinary tract infection with Macrobid for about 7 days, she was noted to be agitated and confused. She was transported to the hospital for further evaluation. On her initial physical examination blood pressure 98/55, 111/59, heart rate 74, respiratory 21, oxygen saturation 94%, her lungs were clear to auscultation bilaterally, heart S1-S2, present, rhythmic, soft abdomen, no lower extremity edema.  Sodium 136, potassium 3.5, chloride 92, bicarb 31, glucose 91, BUN 42, creatinine 1.0, white count 7.9, hemoglobin 16.2, hematocrit 47.7, platelets 170. SARS COVID-19 positive  Urinalysis [place] gravity 1.014, protein 100, RBC> 50,white blood cell> 50.  Head, neck CT no acute changes CT abdomen pelvis no acute changes.  Chest radiograph no infiltrates.  EKG 97 bpm, left axis deviation, left bundle branch block, sinus rhythm, no significant ST segment or T wave changes.  Patient was placed on antibiotic therapy for her urine infection along with antiviral therapy for incidental COVID-19. She developed atrial fibrillation with rapid ventricular response, placed on amiodarone infusion.  Metabolic encephalopathy with poor oral intake, very weak and deconditioned.  Palliative Care has been consulted, with recommendations to continue supportive care.   Her mentation has improved and now taking po medications, crushed with aspiration precautions.  With amiodarone and metoprolol she has converted to sinus rhythm with episodic paroxysmal atrial fibrillation. Anticoagulation has been started with apixaban.   06/05 developed aspiration pneumonia, multilobar with worsening hypoxemic respiratory failure, increase oxygen requirements with high flow nasal cannula.   Clinically has been improving, plan to wean supplemental 02 to 5 L/min or less and transfer back to ALF with hospice services as long as heart rate well controlled.     Assessment & Plan:   Principal Problem:   Sepsis secondary to UTI [facility]) Active Problems:   Memory loss   CVA (cerebral vascular accident) ([place])   HTN (hypertension)   Hyperlipidemia   Atrial fibrillation with RVR ([facility])   Pressure injury of skin   Obesity    1. Sepsis due to urinary tract infection (end-organ failure acute metabolic encephalopathy) present on admission. Completed antibiotic therapy.  2. Acute metabolic encephalopathy with delirium in the setting of baseline dementia.Hx of CVA. Her mentation continue to improve, she is tolerating po well and able to answer simple questions and follow commands. Mittens have been discontinued.   On memantine.  Continue with aspiration precautions and neuro checks per unit protocol.   3. Atrial fibrillation with RVR., acute congested heart failure diastolic.  Old records personally reviewed echocardiogram from 2021 with LV ef 60 to 65%, positive LVH. Preserved RV systolic function.  Last night had RVR related to atrial fibrillation, had 5 mg IV metoprolol, and supplemental 02 increased back up to 15 L/min per HFNC.  This am at 7:00 am converted to sinus rhythm, improved oxygenation.   Plan to increase metoprolol to 50 mg po bid and continue amiodarone 200 mg  bid  for 2 weeks and then decrease to 200 mg daily. Continue anticoagulation with apixaban.  Clinically more euvolemic, no further diuresis.   4. Aspiration pneumonia with acute hypoxemic respiratory failure.  This am oxygenation improved, decreased supplemental 02 to 5 L/min per HFNC.   Plan to continue antibiotic therapy with Unasyn for total 5 days. Aspiration precautions, dysphagia 1 diet.   4, Positive COVID 19 viral infection.Sp remdesivir 3 doses. Plan to discontinue isolation after 10 days of positive test, on 05/13/21.  No clinical signs of viral pneumonia.   5. Obesity class 1 with dyslipidemia/ stage 2 sacrum pressure ulcer present on admission.Calculated BMI is 30, On wound care.   Continue with rosuvastatin.   6. AKI with Hypokalemia. K this am down to 2,9, with serum cr at 0,96, with Cl 112 and serum bicarbonate at 24. Continue K correction with Kcl, will give 80 meq in 2 divided doses   Status is: Inpatient  Remains inpatient appropriate because:Inpatient level of care appropriate due to severity of illness   Dispo: The patient is from: ALF              Anticipated d/c is to: ALF              Patient currently is not medically stable to d/c. Possible dc back to ALF in the next 24 to 48 hrs as long as heart rate and oxygenation stable.    Difficult to place patient No   DVT prophylaxis: apixaban   Code Status:   DNR   Family Communication:  I spoke with patient's husband at the bedside, we talked in detail about patient's condition, plan of care and prognosis and all questions were addressed.    Skin Documentation: Pressure Injury 05/03/21 Sacrum Mid Stage 2 -  Partial thickness loss of dermis presenting as a shallow open injury with a red, pink wound bed without slough. (Active)  05/03/21 1700  Location: Sacrum  Location Orientation: Mid  Staging: Stage 2 -  Partial thickness loss of dermis presenting as a shallow open injury with a red, pink  wound bed without slough.  Wound Description (Comments):   Present on Admission: Yes     Consultants:   Palliative Care    Subjective: Patient is feeling better this am after converting to sinus rhythm, dyspnea has improved, no nausea or vomiting, tolerating po well with aspiration precautions.   Objective: Vitals:   05/12/21 0715 05/12/21 0730 05/12/21 0745 05/12/21 0800  BP:    130/83  Pulse: 67 65 70 72  Resp: (!) 21 (!) 24 (!) 22 (!) 23  Temp:    98.2 F (36.8 C)  TempSrc:    Axillary  SpO2: 93% 93% 94% 91%  Weight:        Intake/Output Summary (Last 24 hours) at 05/12/2021 1048 Last data filed at 05/12/2021 0900 Gross per 24 hour  Intake 940 ml  Output 1450 ml  Net -510 ml   Filed Weights   05/03/21 1700 05/04/21 0400  Weight: 84.2 kg 86 kg    Examination:   General: Not in pain or dyspnea, deconditioned  Neurology: Awake and alert, non focal , confused but not agitated.  E ENT: mild pallor, no icterus, oral mucosa moist Cardiovascular: No JVD. S1-S2 present, rhythmic, no gallops, rubs, or murmurs. Trace non pitting upper and  lower extremity edema, bilateral. Pulmonary: positive breath sounds bilaterally, with no wheezing, rhonchi or rales. Decreased breath sounds at bases with poor inspiratory effort.  Gastrointestinal.  Abdomen soft and non tender Skin. No rashes Musculoskeletal: no joint deformities     Data Reviewed: I have personally reviewed following labs and imaging studies  CBC: Recent Labs  Lab 05/07/21 0250 05/12/21 0404  WBC 7.8 9.1  NEUTROABS  --  7.0  HGB 13.6 13.0  HCT 39.6 39.0  MCV 96.4 98.5  PLT 96* 532   Basic Metabolic Panel: Recent Labs  Lab 05/06/21 0256 05/07/21 0250 05/08/21 0301 05/11/21 0400 05/12/21 0404  NA 142 142 143 145 144  K 3.6 4.7 3.9 3.6 2.9*  CL 116* 115* 115* 114* 112*  CO2 21* 19* 20* 23 24  GLUCOSE 104* 94 92 89 97  BUN 23 19 20 23 20   CREATININE 1.11* 1.14* 1.06* 1.22* 0.96  CALCIUM 8.4* 8.4*  8.6* 8.2* 8.1*  MG 2.2 1.8 2.0 1.7  --   PHOS 2.1* 3.4  --   --   --    GFR: Estimated Creatinine Clearance: 51.6 mL/min (by C-G formula based on SCr of 0.96 mg/dL). Liver Function Tests: No results for input(s): AST, ALT, ALKPHOS, BILITOT, PROT, ALBUMIN in the last 168 hours. No results for input(s): LIPASE, AMYLASE in the last 168 hours. No results for input(s): AMMONIA in the last 168 hours. Coagulation Profile: No results for input(s): INR, PROTIME in the last 168 hours. Cardiac Enzymes: No results for input(s): CKTOTAL, CKMB, CKMBINDEX, TROPONINI in the last 168 hours. BNP (last 3 results) No results for input(s): PROBNP in the last 8760 hours. HbA1C: No results for input(s): HGBA1C in the last 72 hours. CBG: Recent Labs  Lab 05/05/21 1223 05/07/21 2358 05/08/21 1945  GLUCAP 94 78 120*   Lipid Profile: No results for input(s): CHOL, HDL, LDLCALC, TRIG, CHOLHDL, LDLDIRECT in the last 72 hours. Thyroid Function Tests: No results for input(s): TSH, T4TOTAL, FREET4, T3FREE, THYROIDAB in the last 72 hours. Anemia Panel: No results for input(s): VITAMINB12, FOLATE, FERRITIN, TIBC, IRON, RETICCTPCT in the last 72 hours.    Radiology Studies: I have reviewed all of the imaging during this hospital visit personally     Scheduled Meds: . amiodarone  200 mg Oral BID  . apixaban  5 mg Oral BID  . clotrimazole  1 application Topical BID  . mouth rinse  15 mL Mouth Rinse BID  . memantine  10 mg Oral BID  . metoprolol tartrate  50 mg Oral BID  . rosuvastatin  10 mg Oral q1800  . valACYclovir  500 mg Oral Daily   Continuous Infusions: . ampicillin-sulbactam (UNASYN) IV 3 g (05/12/21 9924)     LOS: 9 days        Lean Fayson Gerome Apley, MD

## 2021-05-12 NOTE — Evaluation (Signed)
Physical Therapy Evaluation Patient Details Name: Jennifer Arroyo MRN: [id number] DOB: Dec 27, 1940 Today's Date: 05/12/2021   History of Present Illness  Pt is 80 yo female admitted on 05/03/21 with severe sepsis, UTI, metabolic encephalopathy, prolonged hospitalization due to afib RVR, heart failure, aspiration PNE, and incidental COVID 19. She has medical hx of dementia, dyslipidemia, HTN, afib.  Clinical Impression  Pt admitted with above diagnosis. Pt requiring max x 2 for bed mobility , standing, and pivoting toward HOB.  She was unable to take any steps.  Pt limited due to dementia and following limited commands.  She is from ALF.  Unsure of exact PLOF (unable to reach family), but there was report that pt was ambulatory.  Did note pt has had palliative consult and there has been discussion of hospice.  Per reports, family would like to get pt back to ALF with spouse.  If ALF is able to had max x 2, total care she could return; otherwise would need SNF.  Pt currently with functional limitations due to the deficits listed below (see PT Problem List). Pt will benefit from skilled PT to increase their independence and safety with mobility to allow discharge to the venue listed below.       Follow Up Recommendations Other (comment) (ALF if they can handle max x 2 assist otherwise SNF)    Equipment Recommendations  None recommended by PT    Recommendations for Other Services       Precautions / Restrictions Precautions Precautions: Fall      Mobility  Bed Mobility Overal bed mobility: Needs Assistance Bed Mobility: Rolling;Supine to Sit;Sit to Supine Rolling: Max assist;+2 for physical assistance   Supine to sit: +2 for physical assistance;Max assist Sit to supine: Max assist;+2 for physical assistance   General bed mobility comments: Increased time and cues; difficulty initiating; therapist positioning legs in positions to facilitate transfers and use of bed pads with max x 2 for  all    Transfers Overall transfer level: Needs assistance Equipment used: 2 person hand held assist Transfers: Sit to/from Stand Sit to Stand: Max assist;+2 physical assistance         General transfer comment: Max x 2 with gait belt and bed pad to stand and slightly pivot hips toward HOB to get pt closer to [facility].  Pt did support some weight in legs when up  Ambulation/Gait             General Gait Details: unable  Stairs            Wheelchair Mobility    Modified Rankin (Stroke Patients Only)       Balance Overall balance assessment: Needs assistance Sitting-balance support: Bilateral upper extremity supported Sitting balance-Leahy Scale: Poor Sitting balance - Comments: Initially max A but progressed to close supervision.  With fatigue leaning forward and L.     Standing balance-Leahy Scale: Zero Standing balance comment: required max x 2                             Pertinent Vitals/Pain Pain Assessment: No/denies pain Faces Pain Scale: No hurt    Home Living Family/patient expects to be discharged to:: Assisted living               Home Equipment: Walker - 2 wheels;Shower seat - built in      Prior Function Level of Independence: Needs assistance  Comments: Pt unable to provide PLOF and unable to reach spouse.  There were reports that pt was ambulatory prior to admission.  Per admission 7 months ago pt was mod I ambulation and performed ADLs.     Hand Dominance        Extremity/Trunk Assessment   Upper Extremity Assessment Upper Extremity Assessment: LUE deficits/detail;RUE deficits/detail;Difficult to assess due to impaired cognition RUE Deficits / Details: Shoulder ROM < 90 degrees; strength throughout 2/5 and easily fatigues LUE Deficits / Details: Shoulder ROM < 90 degrees; strength throughout 2/5 and easily fatigues    Lower Extremity Assessment Lower Extremity Assessment: LLE deficits/detail;RLE  deficits/detail;Difficult to assess due to impaired cognition RLE Deficits / Details: ROM WFL; MMT at least 3/5 but not following further commands LLE Deficits / Details: ROM WFL; MMT at least 3/5 but not following further commands    Cervical / Trunk Assessment Cervical / Trunk Assessment: Kyphotic  Communication   Communication: HOH  Cognition Arousal/Alertness: Awake/alert Behavior During Therapy: WFL for tasks assessed/performed Overall Cognitive Status: No family/caregiver present to determine baseline cognitive functioning Area of Impairment: Problem solving;Orientation;Attention;Memory;Following commands;Safety/judgement;Awareness                 Orientation Level: Disoriented to;Place;Time;Situation Current Attention Level: Focused Memory: Decreased short-term memory Following Commands: Follows one step commands inconsistently Safety/Judgement: Decreased awareness of deficits;Decreased awareness of safety Awareness: Intellectual Problem Solving: Slow processing;Decreased initiation;Difficulty sequencing;Requires verbal cues;Requires tactile cues General Comments: Pt does have hx of dementia.  She is only oriented to self and following limited commands. Pt would state "I need to blow my nose" , was given tissue, kept manipulating tissue for several minutes, PT assisted with moving hand to nose but pt still not able to perform task.  Therapist had to hold tissue for pt      General Comments General comments (skin integrity, edema, etc.): Pt on 5 L HFNC O2 with sats >88% during session    Exercises General Exercises - Lower Extremity Ankle Circles/Pumps: AROM;Both;5 reps;Seated Long Arc Quad: AROM;Both;5 reps;Seated Other Exercises Other Exercises: requiring assist to initiate   Assessment/Plan    PT Assessment Patient needs continued PT services  PT Problem List Decreased mobility;Decreased strength;Decreased safety awareness;Decreased range of motion;Decreased  coordination;Decreased knowledge of precautions;Decreased activity tolerance;Cardiopulmonary status limiting activity;Decreased cognition;Decreased balance;Decreased knowledge of use of DME       PT Treatment Interventions Therapeutic activities;DME instruction;Gait training;Therapeutic exercise;Patient/family education;Balance training;Functional mobility training;Wheelchair mobility training    PT Goals (Current goals can be found in the Care Plan section)  Acute Rehab PT Goals Patient Stated Goal: unable PT Goal Formulation: Patient unable to participate in goal setting Time For Goal Achievement: 05/26/21 Potential to Achieve Goals: Poor    Frequency Min 2X/week   Barriers to discharge        Co-evaluation               AM-PAC PT "6 Clicks" Mobility  Outcome Measure Help needed turning from your back to your side while in a flat bed without using bedrails?: Total Help needed moving from lying on your back to sitting on the side of a flat bed without using bedrails?: Total Help needed moving to and from a bed to a chair (including a wheelchair)?: Total Help needed standing up from a chair using your arms (e.g., wheelchair or bedside chair)?: Total Help needed to walk in hospital room?: Total Help needed climbing 3-5 steps with a railing? : Total 6 Click Score: 6  End of Session Equipment Utilized During Treatment: Gait belt;Oxygen Activity Tolerance: Patient tolerated treatment well Patient left: in bed;with call bell/phone within reach;with bed alarm set Nurse Communication: Mobility status;Need for lift equipment PT Visit Diagnosis: Unsteadiness on feet (R26.81);Muscle weakness (generalized) (M62.81)    Time: 6147-0929 PT Time Calculation (min) (ACUTE ONLY): 30 min   Charges:   PT Evaluation $PT Eval Moderate Complexity: 1 Mod PT Treatments $Therapeutic Activity: 8-22 mins        Abran Richard, PT Acute Rehab Services Pager [phone] [facility]  [place] 05/12/2021, 3:43 PM

## 2021-05-13 DIAGNOSIS — J690 Pneumonitis due to inhalation of food and vomit: Secondary | ICD-10-CM

## 2021-05-13 DIAGNOSIS — J9621 Acute and chronic respiratory failure with hypoxia: Secondary | ICD-10-CM

## 2021-05-13 LAB — BASIC METABOLIC PANEL
Anion gap: 8 (ref 5–15)
BUN: 19 mg/dL (ref 8–23)
CO2: 26 mmol/L (ref 22–32)
Calcium: 8.7 mg/dL — ABNORMAL LOW (ref 8.9–10.3)
Chloride: 116 mmol/L — ABNORMAL HIGH (ref 98–111)
Creatinine, Ser: 0.94 mg/dL (ref 0.44–1.00)
GFR, Estimated: >60 mL/min (ref >60)
Glucose, Bld: 106 mg/dL — ABNORMAL HIGH (ref 70–99)
Potassium: 3.6 mmol/L (ref 3.5–5.1)
Sodium: 150 mmol/L — ABNORMAL HIGH (ref 135–145)

## 2021-05-13 NOTE — Plan of Care (Signed)
  Problem: Education: Goal: Knowledge of General Education information will improve Description: Including pain rating scale, medication(s)/side effects and non-pharmacologic comfort measures Outcome: Progressing   Problem: Clinical Measurements: Goal: Diagnostic test results will improve Outcome: Progressing Goal: Respiratory complications will improve Outcome: Progressing   Problem: Pain Managment: Goal: General experience of comfort will improve Outcome: Progressing   Problem: Safety: Goal: Ability to remain free from injury will improve Outcome: Progressing   Problem: Skin Integrity: Goal: Risk for impaired skin integrity will decrease Outcome: Progressing

## 2021-05-13 NOTE — Progress Notes (Signed)
Pharmacy Antibiotic Note  Jennifer Arroyo is a 80 y.o. female admitted on 05/03/2021 with sepsis due to UTI and has completed antibiotic therapy. Pharmacy was subsequently consulted to dose ampicillin/sulbactam for aspiration PNA.   Today, 05/13/21  WBC WNL  SCr stable, WNL. CrCl ~50 mL/min  Afebrile  Today is day #4 of IV ampicillin/sulbactam  Plan:  Continue ampicillin/sulbactam 3 g IV q6h  Renal function stable, pharmacy to sign off. Please re-consult if needed.   Weight: 86 kg (189 lb 9.5 oz)  Temp (24hrs), Avg:97.8 F (36.6 C), Min:97.6 F (36.4 C), Max:98.1 F (36.7 C)  Recent Labs  Lab 05/07/21 0250 05/08/21 0301 05/11/21 0400 05/12/21 0404 05/13/21 0424  WBC 7.8  --   --  9.1  --   CREATININE 1.14* 1.06* 1.22* 0.96 0.94    Estimated Creatinine Clearance: 52.7 mL/min (by C-G formula based on SCr of 0.94 mg/dL).    Allergies  Allergen Reactions  . Amoxicillin Nausea Only  . Ceclor [Cefaclor] Hives  . Levaquin [Levofloxacin] Other (See Comments)  . Nystatin Other (See Comments)    Black scale around teeth.    Lenis Noon, PharmD 05/13/21 10:56 AM

## 2021-05-13 NOTE — Progress Notes (Signed)
PROGRESS NOTE    RUDY DOMEK  YDX:[id number] DOB: 1941-02-07 DOA: 05/03/2021 PCP: Pcp, No    Brief Narrative:  80 year old female past medical history for dementia, dyslipidemia, hypertension, history of CVA and atrial fibrillation who presented with confusion. Apparently patient sustained a unwitnessed fall, at the facility where she lives. She has been treated for urinary tract infection with Macrobid for about 7 days, she was noted to be agitated and confused. She was transported to the hospital for further evaluation.  Patient was placed on antibiotic therapy for her urine infection along with antiviral therapy for incidental COVID-19.  She developed atrial fibrillation with rapid ventricular response, placed on amiodarone infusion.  Continues to remain encephalopathic.  Palliative care was consulted.    Subsequently she developed aspiration pneumonia with hypoxemic respiratory failure.  Continues to require 4 to 5 L of oxygen.   Assessment & Plan:   Sepsis due to urinary tract infection (end-organ failure acute metabolic encephalopathy) present on admission. Completed antibiotic therapy.  Acute metabolic encephalopathy with delirium in the setting of baseline dementia/Hx of CVA. Patient remains encephalopathic although has been tolerating oral better than before.  Answering more questions better than before.  Remains on memantine.  Continue with aspiration precautions.  Continue to reorient daily.    Atrial fibrillation with RVR/acute diastolic CHF Patient went into RVR.  Seen by cardiology.  Required IV metoprolol.  Seems to have converted to sinus rhythm.  Noted to be on amiodarone and metoprolol which is being continued.  Anticoagulated with apixaban. She was also diuresed.  Seems to be well compensated now.  Acute respiratory failure with hypoxia/aspiration pneumonia Remains on Unasyn.  Will be given for total of 5 days.  Remains on 4 to 5 L of oxygen.  Try to  wean this down if possible.  Maintain saturation greater than 89%.  On dysphagia 1 diet.  Positive COVID 19 viral infection She was given Remdesivir for 3 doses.  Isolation discontinued as of today.  Does not have any clinical signs of viral pneumonia   Acute kidney injury Resolved with IV fluids.  Hypokalemia Corrected.  Hypernatremia Mildly elevated sodium level noted this morning.  We will recheck tomorrow.  Encourage free water intake with aspiration precautions.  Skin Documentation: Pressure Injury 05/03/21 Sacrum Mid Stage 2 -  Partial thickness loss of dermis presenting as a shallow open injury with a red, pink wound bed without slough. (Active)  05/03/21 1700  Location: Sacrum  Location Orientation: Mid  Staging: Stage 2 -  Partial thickness loss of dermis presenting as a shallow open injury with a red, pink wound bed without slough.  Wound Description (Comments):   Present on Admission: Yes     DVT prophylaxis: apixaban   Code Status:   DNR   Family Communication:  No family at bedside Disposition: It appears the plan is for patient to go back to assisted living facility with hospice services once oxygen requirements are less than 5 L.   Status is: Inpatient  Remains inpatient appropriate because:Inpatient level of care appropriate due to severity of illness   Dispo: The patient is from: ALF              Anticipated d/c is to: ALF              Patient currently is not medically stable to d/c. Possible dc back to ALF in the next 24 to 48 hrs as long as heart rate and oxygenation stable.  Difficult to place patient No    Consultants:   Palliative Care    Subjective: Patient pleasantly confused.  Denies any chest pain.  Does not appear to be in any discomfort.  Objective: Vitals:   05/13/21 0820 05/13/21 0830 05/13/21 0840 05/13/21 0841  BP:    (!) 148/90  Pulse: 65 65 66   Resp: (!) 22 (!) 22 (!) 23   Temp:    98.1 F (36.7 C)  TempSrc:       SpO2: 91% 95% 90%   Weight:        Intake/Output Summary (Last 24 hours) at 05/13/2021 1251 Last data filed at 05/12/2021 2000 Gross per 24 hour  Intake 240 ml  Output 750 ml  Net -510 ml   Filed Weights   05/03/21 1700 05/04/21 0400  Weight: 84.2 kg 86 kg    Examination:   General appearance: Awake alert.  In no distress.  Distracted Resp: Mildly tachypneic without use of accessory muscles.  Coarse breath sounds with crackles bilateral bases.  No wheezing. Cardio: S1-S2 is normal regular.  No S3-S4.  No rubs murmurs or bruit GI: Abdomen is soft.  Nontender nondistended.  Bowel sounds are present normal.  No masses organomegaly Extremities: No edema.  Moving all of her extremities Neurologic: No focal neurological deficits.     Data Reviewed: I have personally reviewed following labs and imaging studies  CBC: Recent Labs  Lab 05/07/21 0250 05/12/21 0404  WBC 7.8 9.1  NEUTROABS  --  7.0  HGB 13.6 13.0  HCT 39.6 39.0  MCV 96.4 98.5  PLT 96* 539   Basic Metabolic Panel: Recent Labs  Lab 05/07/21 0250 05/08/21 0301 05/11/21 0400 05/12/21 0404 05/13/21 0424  NA 142 143 145 144 150*  K 4.7 3.9 3.6 2.9* 3.6  CL 115* 115* 114* 112* 116*  CO2 19* 20* 23 24 26   GLUCOSE 94 92 89 97 106*  BUN 19 20 23 20 19   CREATININE 1.14* 1.06* 1.22* 0.96 0.94  CALCIUM 8.4* 8.6* 8.2* 8.1* 8.7*  MG 1.8 2.0 1.7  --   --   PHOS 3.4  --   --   --   --    GFR: Estimated Creatinine Clearance: 52.7 mL/min (by C-G formula based on SCr of 0.94 mg/dL).  CBG: Recent Labs  Lab 05/07/21 2358 05/08/21 1945  GLUCAP 78 120*    Radiology Studies: I have reviewed all of the imaging during this hospital visit personally     Scheduled Meds: . amiodarone  200 mg Oral BID  . apixaban  5 mg Oral BID  . clotrimazole  1 application Topical BID  . mouth rinse  15 mL Mouth Rinse BID  . memantine  10 mg Oral BID  . metoprolol tartrate  50 mg Oral BID  . rosuvastatin  10 mg Oral q1800  .  valACYclovir  500 mg Oral Daily   Continuous Infusions: . ampicillin-sulbactam (UNASYN) IV 3 g (05/13/21 1117)     LOS: 10 days      Bonnielee Haff, MD

## 2021-05-13 NOTE — Progress Notes (Signed)
Telemetry reviewed NSR rates 70-80 with LBBB Continue bid amiodarone 200 mg can d/c on 200 mg daily Continue eliquis   Jenkins Rouge MD [facility]

## 2021-05-14 LAB — BASIC METABOLIC PANEL
Anion gap: 5 (ref 5–15)
BUN: 18 mg/dL (ref 8–23)
CO2: 27 mmol/L (ref 22–32)
Calcium: 8.3 mg/dL — ABNORMAL LOW (ref 8.9–10.3)
Chloride: 117 mmol/L — ABNORMAL HIGH (ref 98–111)
Creatinine, Ser: 0.95 mg/dL (ref 0.44–1.00)
GFR, Estimated: >60 mL/min (ref >60)
Glucose, Bld: 85 mg/dL (ref 70–99)
Potassium: 3.3 mmol/L — ABNORMAL LOW (ref 3.5–5.1)
Sodium: 149 mmol/L — ABNORMAL HIGH (ref 135–145)

## 2021-05-14 LAB — CBC
HCT: 35.3 % — ABNORMAL LOW (ref 36.0–46.0)
Hemoglobin: 11.6 g/dL — ABNORMAL LOW (ref 12.0–15.0)
MCH: 33 pg (ref 26.0–34.0)
MCHC: 32.9 g/dL (ref 30.0–36.0)
MCV: 100.3 fL — ABNORMAL HIGH (ref 80.0–100.0)
Platelets: 317 10*3/uL (ref 150–400)
RBC: 3.52 MIL/uL — ABNORMAL LOW (ref 3.87–5.11)
RDW: 14.8 % (ref 11.5–15.5)
WBC: 7 10*3/uL (ref 4.0–10.5)
nRBC: 0 % (ref 0.0–0.2)

## 2021-05-14 MED ORDER — POTASSIUM CHLORIDE CRYS ER 20 MEQ PO TBCR
40.0000 meq | EXTENDED_RELEASE_TABLET | Freq: Once | ORAL | Status: AC
  Administered 2021-05-14: 40 meq via ORAL
  Filled 2021-05-14: qty 2

## 2021-05-14 NOTE — NC FL2 (Signed)
[place] LEVEL OF CARE SCREENING TOOL     IDENTIFICATION  Patient Name: Jennifer Arroyo Birthdate: [birth year] Sex: female Admission Date (Current Location): 05/03/2021  [facility] and [place] Number:  Herbalist and Address:  [facility],  [address] [place], [place]      Provider Number: [id number]  Attending Physician Name and Address:  Bonnielee Haff, MD  Relative Name and Phone Number:  Kyera, Felan Spouse [phone]  [phone]    Current Level of Care: Hospital Recommended Level of Care: [place] Prior Approval Number:    Date Approved/Denied:   PASRR Number: [id number]  Discharge Plan: SNF    Current Diagnoses: Patient Active Problem List   Diagnosis Date Noted   Sepsis secondary to UTI ([place]) 05/07/2021   Obesity 05/07/2021   Pressure injury of skin 05/04/2021   Sepsis ([place]) 05/03/2021   CVA (cerebral vascular accident) ([place]) 09/19/2020   New onset a-fib ([place]) 09/19/2020   HTN (hypertension) 09/19/2020   Hyperlipidemia 09/19/2020   Dementia without behavioral disturbance ([place]) 09/19/2020   Fall at home, initial encounter 09/19/2020   Bacteria in urine 09/19/2020   Atrial fibrillation with RVR ([place])    Memory loss 01/11/2019   Gait abnormality 01/11/2019    Orientation RESPIRATION BLADDER Height & Weight     Self  O2 (5L O2 [place]) Incontinent Weight: 86 kg Height:  5\' 6"  (167.6 cm)  BEHAVIORAL SYMPTOMS/MOOD NEUROLOGICAL BOWEL NUTRITION STATUS      Incontinent Diet (Dysphagia)  AMBULATORY STATUS COMMUNICATION OF NEEDS Skin   Extensive Assist Verbally Other (Comment) (Sacrum Mid Stage 2)                       Personal Care Assistance Level of Assistance  Bathing, Feeding, Dressing Bathing Assistance: Maximum assistance Feeding assistance: Maximum assistance Dressing Assistance: Maximum assistance     Functional Limitations Info  Sight, Hearing, Speech Sight Info:  Impaired Hearing Info: Adequate Speech Info: Adequate    SPECIAL CARE FACTORS FREQUENCY  PT (By licensed PT), OT (By licensed OT)     PT Frequency: x5 week OT Frequency: x5 week            Contractures Contractures Info: Not present    Additional Factors Info  Code Status, Allergies, Psychotropic Code Status Info: DNR Allergies Info: Amoxicillin, Ceclor (Cefaclor), Levaquin (Levofloxacin), Nystatin Psychotropic Info: Memantine 10mg  twice a day         Current Medications (05/14/2021):  This is the current hospital active medication list Current Facility-Administered Medications  Medication Dose Route Frequency Provider Last Rate Last Admin   acetaminophen (TYLENOL) tablet 650 mg  650 mg Oral Q6H PRN Marylyn Ishihara, Tyrone A, DO   650 mg at 05/10/21 2242   Or   acetaminophen (TYLENOL) suppository 650 mg  650 mg Rectal Q6H PRN Marylyn Ishihara, Tyrone A, DO       amiodarone (PACERONE) tablet 200 mg  200 mg Oral BID Josue Hector, MD   200 mg at 05/14/21 1002   Ampicillin-Sulbactam (UNASYN) 3 g in sodium chloride 0.9 % 100 mL IVPB  3 g Intravenous Q6H Arrien, Jimmy Picket, MD 200 mL/hr at 05/14/21 1208 3 g at 05/14/21 1208   apixaban (ELIQUIS) tablet 5 mg  5 mg Oral BID Marylyn Ishihara, Tyrone A, DO   5 mg at 05/14/21 1002   clotrimazole (LOTRIMIN) 1 % cream 1 application  1 application Topical BID Arrien, Jimmy Picket, MD   1 application  at 05/14/21 1003   MEDLINE mouth rinse  15 mL Mouth Rinse BID Marylyn Ishihara, Tyrone A, DO   15 mL at 05/14/21 1003   memantine (NAMENDA) tablet 10 mg  10 mg Oral BID Marylyn Ishihara, Tyrone A, DO   10 mg at 05/14/21 1002   metoprolol tartrate (LOPRESSOR) tablet 50 mg  50 mg Oral BID Dion Body, MD   50 mg at 05/14/21 1002   ondansetron (ZOFRAN) tablet 4 mg  4 mg Oral Q6H PRN Cherylann Ratel A, DO       Or   ondansetron (ZOFRAN) injection 4 mg  4 mg Intravenous Q6H PRN Marylyn Ishihara, Tyrone A, DO       polyvinyl alcohol (LIQUIFILM TEARS) 1.4 % ophthalmic solution 1 drop  1 drop Both Eyes PRN  Marylyn Ishihara, Tyrone A, DO       rosuvastatin (CRESTOR) tablet 10 mg  10 mg Oral q1800 Kyle, Tyrone A, DO   10 mg at 05/13/21 1639   valACYclovir (VALTREX) tablet 500 mg  500 mg Oral Daily Kyle, Tyrone A, DO   500 mg at 05/14/21 1002     Discharge Medications: Please see discharge summary for a list of discharge medications.  Relevant Imaging Results:  Relevant Lab Results:   Additional Information OQ#[id number]  Purcell Mouton, RN

## 2021-05-14 NOTE — TOC Progression Note (Signed)
Transition of Care [facility]) - Progression Note    Patient Details  Name: Jennifer Arroyo MRN: [id number] Date of Birth: Apr 30, 1941  Transition of Care [facility]) CM/SW Contact  Purcell Mouton, RN Phone Number: 05/14/2021, 1:57 PM  Clinical Narrative:     Pt was vaccinated for COVID: [place] 02/14/20, 03/06/20, 03/05/21.  Expected Discharge Plan: Assisted Living Barriers to Discharge: Continued Medical Work up  Expected Discharge Plan and Services Expected Discharge Plan: Assisted Living   Discharge Planning Services: CM Consult   Living arrangements for the past 2 months: Assisted Living Facility                                       Social Determinants of Health (SDOH) Interventions    Readmission Risk Interventions No flowsheet data found.

## 2021-05-14 NOTE — Progress Notes (Signed)
PROGRESS NOTE    Jennifer Arroyo  EXH:[id number] DOB: May 13, 1941 DOA: 05/03/2021 PCP: Pcp, No    Brief Narrative:  80 year old female past medical history for dementia, dyslipidemia, hypertension, history of CVA and atrial fibrillation who presented with confusion.  Apparently patient sustained a unwitnessed fall, at the facility where she lives.  She has been treated for urinary tract infection with Macrobid for about 7 days, she was noted to be agitated and confused.  She was transported to the hospital for further evaluation.   Patient was placed on antibiotic therapy for her urine infection along with antiviral therapy for incidental COVID-19.  She developed atrial fibrillation with rapid ventricular response, placed on amiodarone infusion.   Continues to remain encephalopathic.  Palliative care was consulted.    Subsequently she developed aspiration pneumonia with hypoxemic respiratory failure.  Continues to require 4 to 5 L of oxygen.   Assessment & Plan:   Acute respiratory failure with hypoxia/aspiration pneumonia Remains on Unasyn.  Will be given for total of 5 days.   Continues to require 4 to 5 L of oxygen by nasal cannula.  Try and wean this down as tolerated to maintain saturations greater than 89%.  Remains on dysphagia 1 diet.    Acute metabolic encephalopathy with delirium in the setting of baseline dementia/Hx of CVA.  Patient remains encephalopathic although has been tolerating oral better than before.  Answering more questions better than before.  Remains on memantine.  Continue with aspiration precautions.  Continue to reorient daily.    Atrial fibrillation with RVR/acute diastolic CHF Patient went into RVR.  Seen by cardiology.  Required IV metoprolol.  Seems to have converted to sinus rhythm.  Noted to be on amiodarone and metoprolol which is being continued.  Anticoagulated with apixaban. She was also diuresed.  Seems to be well compensated now.  Sepsis due to  urinary tract infection (end-organ failure acute metabolic encephalopathy) present on admission.  Completed antibiotic therapy.   Positive COVID 19 viral infection She was given Remdesivir for 3 doses.  Isolation discontinued as of 05/13/2021.  Does not have any clinical signs of viral pneumonia   Acute kidney injury Resolved with IV fluids.  Hypokalemia Will be repleted.  Magnesium was 1.72 days ago.Marland Kitchen  Hypernatremia Levels mildly elevated but stable.  Encourage free water intake with aspiration precautions.  Skin Documentation: Pressure Injury 05/03/21 Sacrum Mid Stage 2 -  Partial thickness loss of dermis presenting as a shallow open injury with a red, pink wound bed without slough. (Active)  05/03/21 1700  Location: Sacrum  Location Orientation: Mid  Staging: Stage 2 -  Partial thickness loss of dermis presenting as a shallow open injury with a red, pink wound bed without slough.  Wound Description (Comments):   Present on Admission: Yes     DVT prophylaxis: apixaban   Code Status:   DNR   Family Communication:  No family at bedside Disposition: It appears the plan is for patient to go back to assisted living facility with hospice services once oxygen requirements are less than 5 L.    Status is: Inpatient  Remains inpatient appropriate because:Inpatient level of care appropriate due to severity of illness   Dispo: The patient is from: ALF              Anticipated d/c is to: SNF              Patient currently is not medically stable to d/c.    Difficult to  place patient No    Consultants:  Palliative Care    Subjective: Patient remains pleasantly confused.  Keeps asking my name every few minutes.  Denies any pain issues.  Objective: Vitals:   05/13/21 2000 05/13/21 2200 05/14/21 0000 05/14/21 0306  BP: 106/66 117/72 (!) 109/56   Pulse: (!) 55 (!) 57 (!) 47   Resp: 18 15 20    Temp:  97.6 F (36.4 C)    TempSrc:  Axillary    SpO2: (!) 88% (!) 87% 90%    Weight:      Height:    5\' 6"  (1.676 m)    Intake/Output Summary (Last 24 hours) at 05/14/2021 1015 Last data filed at 05/14/2021 0700 Gross per 24 hour  Intake 50 ml  Output 1100 ml  Net -1050 ml    Filed Weights   05/03/21 1700 05/04/21 0400  Weight: 84.2 kg 86 kg    Examination:   General appearance: Awake alert.  In no distress.  Remains distracted Resp: Mildly tachypneic without use of accessory muscles.  Crackles bilateral bases.  No wheezing or rhonchi.   Cardio: S1-S2 is normal regular.  No S3-S4.  No rubs murmurs or bruit GI: Abdomen is soft.  Nontender nondistended.  Bowel sounds are present normal.  No masses organomegaly Extremities: No edema.  Noted to be moving all of her extremities Neurologic: Disoriented.  No focal neurological deficits.     Data Reviewed: I have personally reviewed following labs and imaging studies  CBC: Recent Labs  Lab 05/12/21 0404 05/14/21 0422  WBC 9.1 7.0  NEUTROABS 7.0  --   HGB 13.0 11.6*  HCT 39.0 35.3*  MCV 98.5 100.3*  PLT 261 735    Basic Metabolic Panel: Recent Labs  Lab 05/08/21 0301 05/11/21 0400 05/12/21 0404 05/13/21 0424 05/14/21 0422  NA 143 145 144 150* 149*  K 3.9 3.6 2.9* 3.6 3.3*  CL 115* 114* 112* 116* 117*  CO2 20* 23 24 26 27   GLUCOSE 92 89 97 106* 85  BUN 20 23 20 19 18   CREATININE 1.06* 1.22* 0.96 0.94 0.95  CALCIUM 8.6* 8.2* 8.1* 8.7* 8.3*  MG 2.0 1.7  --   --   --     GFR: Estimated Creatinine Clearance: 52.2 mL/min (by C-G formula based on SCr of 0.95 mg/dL).  CBG: Recent Labs  Lab 05/07/21 2358 05/08/21 1945  GLUCAP 78 120*     Radiology Studies: I have reviewed all of the imaging during this hospital visit personally     Scheduled Meds:  amiodarone  200 mg Oral BID   apixaban  5 mg Oral BID   clotrimazole  1 application Topical BID   mouth rinse  15 mL Mouth Rinse BID   memantine  10 mg Oral BID   metoprolol tartrate  50 mg Oral BID   rosuvastatin  10 mg Oral q1800    valACYclovir  500 mg Oral Daily   Continuous Infusions:  ampicillin-sulbactam (UNASYN) IV 3 g (05/14/21 0518)     LOS: 11 days      Bonnielee Haff, MD

## 2021-05-14 NOTE — Final Progress Note (Signed)
Alert and oriented to self and place. Disoriented to time and situation. Max assistance with positioning and adls. Poor appetite, took just bites from dinner tray. IV abt infused and tolerated well. Occasional crying saying she doesn't what is happening.. will continue plan of care.

## 2021-05-15 DIAGNOSIS — F0390 Unspecified dementia without behavioral disturbance: Secondary | ICD-10-CM | POA: Diagnosis present

## 2021-05-15 DIAGNOSIS — I5031 Acute diastolic (congestive) heart failure: Secondary | ICD-10-CM | POA: Diagnosis not present

## 2021-05-15 DIAGNOSIS — R0902 Hypoxemia: Secondary | ICD-10-CM | POA: Diagnosis not present

## 2021-05-15 DIAGNOSIS — M6281 Muscle weakness (generalized): Secondary | ICD-10-CM | POA: Diagnosis not present

## 2021-05-15 DIAGNOSIS — R0689 Other abnormalities of breathing: Secondary | ICD-10-CM | POA: Diagnosis not present

## 2021-05-15 DIAGNOSIS — K631 Perforation of intestine (nontraumatic): Secondary | ICD-10-CM | POA: Diagnosis not present

## 2021-05-15 DIAGNOSIS — N320 Bladder-neck obstruction: Secondary | ICD-10-CM | POA: Diagnosis not present

## 2021-05-15 DIAGNOSIS — Z515 Encounter for palliative care: Secondary | ICD-10-CM | POA: Diagnosis not present

## 2021-05-15 DIAGNOSIS — E785 Hyperlipidemia, unspecified: Secondary | ICD-10-CM | POA: Diagnosis not present

## 2021-05-15 DIAGNOSIS — J9601 Acute respiratory failure with hypoxia: Secondary | ICD-10-CM | POA: Diagnosis not present

## 2021-05-15 DIAGNOSIS — I4891 Unspecified atrial fibrillation: Secondary | ICD-10-CM | POA: Diagnosis not present

## 2021-05-15 DIAGNOSIS — E870 Hyperosmolality and hypernatremia: Secondary | ICD-10-CM | POA: Diagnosis not present

## 2021-05-15 DIAGNOSIS — Z8673 Personal history of transient ischemic attack (TIA), and cerebral infarction without residual deficits: Secondary | ICD-10-CM | POA: Diagnosis not present

## 2021-05-15 DIAGNOSIS — E46 Unspecified protein-calorie malnutrition: Secondary | ICD-10-CM | POA: Diagnosis not present

## 2021-05-15 DIAGNOSIS — N390 Urinary tract infection, site not specified: Secondary | ICD-10-CM | POA: Diagnosis not present

## 2021-05-15 DIAGNOSIS — R1084 Generalized abdominal pain: Secondary | ICD-10-CM | POA: Diagnosis not present

## 2021-05-15 DIAGNOSIS — Z83438 Family history of other disorder of lipoprotein metabolism and other lipidemia: Secondary | ICD-10-CM | POA: Diagnosis not present

## 2021-05-15 DIAGNOSIS — R188 Other ascites: Secondary | ICD-10-CM | POA: Diagnosis not present

## 2021-05-15 DIAGNOSIS — R5381 Other malaise: Secondary | ICD-10-CM | POA: Diagnosis not present

## 2021-05-15 DIAGNOSIS — R6889 Other general symptoms and signs: Secondary | ICD-10-CM | POA: Diagnosis not present

## 2021-05-15 DIAGNOSIS — Z7189 Other specified counseling: Secondary | ICD-10-CM | POA: Diagnosis not present

## 2021-05-15 DIAGNOSIS — E559 Vitamin D deficiency, unspecified: Secondary | ICD-10-CM | POA: Diagnosis not present

## 2021-05-15 DIAGNOSIS — G9341 Metabolic encephalopathy: Secondary | ICD-10-CM | POA: Diagnosis not present

## 2021-05-15 DIAGNOSIS — N139 Obstructive and reflux uropathy, unspecified: Secondary | ICD-10-CM | POA: Diagnosis not present

## 2021-05-15 DIAGNOSIS — I480 Paroxysmal atrial fibrillation: Secondary | ICD-10-CM | POA: Diagnosis not present

## 2021-05-15 DIAGNOSIS — G4700 Insomnia, unspecified: Secondary | ICD-10-CM | POA: Diagnosis not present

## 2021-05-15 DIAGNOSIS — E876 Hypokalemia: Secondary | ICD-10-CM | POA: Diagnosis not present

## 2021-05-15 DIAGNOSIS — R1312 Dysphagia, oropharyngeal phase: Secondary | ICD-10-CM | POA: Diagnosis not present

## 2021-05-15 DIAGNOSIS — Z66 Do not resuscitate: Secondary | ICD-10-CM | POA: Diagnosis not present

## 2021-05-15 DIAGNOSIS — R410 Disorientation, unspecified: Secondary | ICD-10-CM | POA: Diagnosis not present

## 2021-05-15 DIAGNOSIS — R279 Unspecified lack of coordination: Secondary | ICD-10-CM | POA: Diagnosis not present

## 2021-05-15 DIAGNOSIS — I10 Essential (primary) hypertension: Secondary | ICD-10-CM | POA: Diagnosis not present

## 2021-05-15 DIAGNOSIS — K8020 Calculus of gallbladder without cholecystitis without obstruction: Secondary | ICD-10-CM | POA: Diagnosis not present

## 2021-05-15 DIAGNOSIS — N179 Acute kidney failure, unspecified: Secondary | ICD-10-CM | POA: Diagnosis not present

## 2021-05-15 DIAGNOSIS — J90 Pleural effusion, not elsewhere classified: Secondary | ICD-10-CM | POA: Diagnosis not present

## 2021-05-15 DIAGNOSIS — J690 Pneumonitis due to inhalation of food and vomit: Secondary | ICD-10-CM | POA: Diagnosis not present

## 2021-05-15 DIAGNOSIS — A419 Sepsis, unspecified organism: Secondary | ICD-10-CM | POA: Diagnosis not present

## 2021-05-15 DIAGNOSIS — R109 Unspecified abdominal pain: Secondary | ICD-10-CM | POA: Diagnosis not present

## 2021-05-15 DIAGNOSIS — R58 Hemorrhage, not elsewhere classified: Secondary | ICD-10-CM | POA: Diagnosis not present

## 2021-05-15 DIAGNOSIS — Z880 Allergy status to penicillin: Secondary | ICD-10-CM | POA: Diagnosis not present

## 2021-05-15 DIAGNOSIS — Z888 Allergy status to other drugs, medicaments and biological substances status: Secondary | ICD-10-CM | POA: Diagnosis not present

## 2021-05-15 DIAGNOSIS — Z743 Need for continuous supervision: Secondary | ICD-10-CM | POA: Diagnosis not present

## 2021-05-15 DIAGNOSIS — Z8249 Family history of ischemic heart disease and other diseases of the circulatory system: Secondary | ICD-10-CM | POA: Diagnosis not present

## 2021-05-15 DIAGNOSIS — Z823 Family history of stroke: Secondary | ICD-10-CM | POA: Diagnosis not present

## 2021-05-15 DIAGNOSIS — Z7901 Long term (current) use of anticoagulants: Secondary | ICD-10-CM | POA: Diagnosis not present

## 2021-05-15 DIAGNOSIS — R404 Transient alteration of awareness: Secondary | ICD-10-CM | POA: Diagnosis not present

## 2021-05-15 DIAGNOSIS — U071 COVID-19: Secondary | ICD-10-CM | POA: Diagnosis not present

## 2021-05-15 DIAGNOSIS — Z8616 Personal history of COVID-19: Secondary | ICD-10-CM | POA: Diagnosis not present

## 2021-05-15 DIAGNOSIS — Z87891 Personal history of nicotine dependence: Secondary | ICD-10-CM | POA: Diagnosis not present

## 2021-05-15 LAB — BASIC METABOLIC PANEL
Anion gap: 7 (ref 5–15)
BUN: 17 mg/dL (ref 8–23)
CO2: 27 mmol/L (ref 22–32)
Calcium: 8.2 mg/dL — ABNORMAL LOW (ref 8.9–10.3)
Chloride: 113 mmol/L — ABNORMAL HIGH (ref 98–111)
Creatinine, Ser: 0.84 mg/dL (ref 0.44–1.00)
GFR, Estimated: >60 mL/min (ref >60)
Glucose, Bld: 72 mg/dL (ref 70–99)
Potassium: 3.3 mmol/L — ABNORMAL LOW (ref 3.5–5.1)
Sodium: 147 mmol/L — ABNORMAL HIGH (ref 135–145)

## 2021-05-15 LAB — MAGNESIUM: Magnesium: 1.8 mg/dL (ref 1.7–2.4)

## 2021-05-15 MED ORDER — AMIODARONE HCL 200 MG PO TABS: 200.0000 mg | ORAL_TABLET | Freq: Every day | ORAL | Status: AC

## 2021-05-15 MED ORDER — AMIODARONE HCL 200 MG PO TABS: 200.0000 mg | ORAL_TABLET | Freq: Two times a day (BID) | ORAL | Status: DC

## 2021-05-15 MED ORDER — POTASSIUM CHLORIDE CRYS ER 20 MEQ PO TBCR
40.0000 meq | EXTENDED_RELEASE_TABLET | ORAL | Status: AC
  Administered 2021-05-15 (×2): 40 meq via ORAL
  Filled 2021-05-15 (×2): qty 2

## 2021-05-15 MED ORDER — METOPROLOL TARTRATE 50 MG PO TABS: 50.0000 mg | ORAL_TABLET | Freq: Two times a day (BID) | ORAL | Status: AC

## 2021-05-15 NOTE — Care Management Important Message (Signed)
Important Message  Patient Details IM Letter given to the Patient. Name: Jennifer Arroyo MRN: [id number] Date of Birth: 1941/10/31   Medicare Important Message Given:  Yes     Kerin Salen 05/15/2021, 11:44 AM

## 2021-05-15 NOTE — TOC Transition Note (Signed)
Transition of Care [facility]) - CM/SW Discharge Note   Patient Details  Name: Jennifer Arroyo MRN: [id number] Date of Birth: 1941-05-31  Transition of Care [facility]) CM/SW Contact:  Illene Regulus, LCSW Phone Number: 05/15/2021, 12:38 PM   Clinical Narrative:     Patient discharging to [facility] today. Patient and family agreeable to plans , will transport via EMS to call report [phone]. Patient husband wants to be called when EMS arrives.   Final next level of care: Skilled Nursing Facility Barriers to Discharge: Barriers Resolved   Patient Goals and CMS Choice Patient states their goals for this hospitalization and ongoing recovery are:: Plan to go to SNF for short term rehab CMS Medicare.gov Compare Post Acute Care list provided to:: Patient Choice offered to / list presented to : Patient, Spouse  Discharge Placement PASRR number recieved: 05/14/21            Patient [facility] at: [facility] Patient to be transferred to facility by: [facility] Name of family member notified: Lovena, Kluck Spouse [phone] Patient and family notified of of transfer: 05/15/21  Discharge Plan and Services   Discharge Planning Services: CM Consult                                 Social Determinants of Health (SDOH) Interventions     Readmission Risk Interventions No flowsheet data found.

## 2021-05-15 NOTE — Progress Notes (Signed)
Report called and given to Wyatt Portela, LPN at [facility]

## 2021-05-15 NOTE — Discharge Summary (Signed)
[facility]  Physician Discharge Summary   Patient ID: Jennifer Arroyo MRN: [id number] DOB/AGE: 04-05-1941 80 y.o.  Admit date: 05/03/2021 Discharge date:   05/15/2021   PCP: Pcp, No  DISCHARGE DIAGNOSES:  Aspiration pneumonia with acute respiratory failure with hypoxia Acute metabolic encephalopathy Baseline dementia History of stroke Paroxysmal atrial fibrillation Urinary tract infection with sepsis, resolved COVID-19 infection Acute kidney injury, resolved   RECOMMENDATIONS FOR OUTPATIENT FOLLOW UP: Hospice services to follow patient at skilled nursing facility Please check CBC and basic metabolic panel in 4 to 5 days.    Home Health: Going to SNF Equipment/Devices: None  CODE STATUS: DNR  DISCHARGE CONDITION: fair  Diet recommendation: Dysphagia 1 diet with thin liquids  INITIAL HISTORY: 80 year old female past medical history for dementia, dyslipidemia, hypertension, history of CVA and atrial fibrillation who presented with confusion.  Apparently patient sustained a unwitnessed fall, at the facility where she lives.  She has been treated for urinary tract infection with Macrobid for about 7 days, she was noted to be agitated and confused.  She was transported to the hospital for further evaluation.   Patient was placed on antibiotic therapy for her urine infection along with antiviral therapy for incidental COVID-19.   She developed atrial fibrillation with rapid ventricular response, placed on amiodarone infusion.   Continues to remain encephalopathic.  Palliative care was consulted.     Subsequently she developed aspiration pneumonia with hypoxemic respiratory failure.  Continues to require 4 to 5 L of oxygen.  Consultations: Palliative care  Procedures: None   HOSPITAL COURSE:   Acute respiratory failure with hypoxia/aspiration pneumonia Patient with oropharyngeal dysphagia and aspiration.  Seen by speech therapy.  Placed on Unasyn.  She will  complete 5 days today prior to discharge.  Oxygen requirements had been high during this hospital stay.  Seems to be improving.  Now saturating in the mid 90s on 3 to 4 L by nasal cannula.  Remains on dysphagia 1 diet with thin liquids.     Acute metabolic encephalopathy with delirium in the setting of baseline dementia/Hx of CVA.  Patient remains encephalopathic although has been tolerating oral better than before.  Answering more questions better than before.  Remains on memantine.  Continue with aspiration precautions.  Continue to reorient daily.     Paroxysmal atrial fibrillation/acute diastolic CHF Patient went into RVR.  Seen by cardiology.  Required IV metoprolol.  Seems to have converted to sinus rhythm.  Noted to be on amiodarone and metoprolol which is being continued.  Anticoagulated with apixaban. She was also diuresed.  Seems to be well compensated now. Continue to monitor volume status.   Sepsis due to urinary tract infection (end-organ failure acute metabolic encephalopathy) present on admission.  Completed antibiotic therapy.    Positive COVID 19 viral infection She was given Remdesivir for 3 doses.  Isolation discontinued as of 05/13/2021.    Acute kidney injury Resolved with IV fluids.   Hypokalemia Will be supplemented prior to discharge.  Continue daily dosing.   Hypernatremia Levels mildly elevated but stable.  Encourage free water intake with aspiration precautions.   Skin Documentation: Pressure Injury 05/03/21 Sacrum Mid Stage 2 -  Partial thickness loss of dermis presenting as a shallow open injury with a red, pink wound bed without slough. (Active)  05/03/21 1700  Location: Sacrum  Location Orientation: Mid  Staging: Stage 2 -  Partial thickness loss of dermis presenting as a shallow open injury with a red, pink wound bed without  slough.  Wound Description (Comments):  Present on Admission: Yes      Obesity Estimated body mass index is 30.6 kg/m as  calculated from the following:   Height as of this encounter: 5\' 6"  (1.676 m).   Weight as of this encounter: 86 kg.  Goals of care Patient seen by palliative care.  Discussions held with patient's husband.  Considering her poor prognosis it was felt appropriate to discharge the patient to skilled nursing facility with hospice services to follow.  Depending on her progress over the next few weeks she may need to be transition to residential hospice if she continues to decline.  Patient is stable at this time for discharge.  Okay to go to skilled nursing facility.    PERTINENT LABS:  The results of significant diagnostics from this hospitalization (including imaging, microbiology, ancillary and laboratory) are listed below for reference.      Labs:  COVID-19 Labs    Lab Results  Component Value Date   SARSCOV2NAA POSITIVE (A) 05/03/2021   [place] NEGATIVE 09/19/2020      Basic Metabolic Panel: Recent Labs  Lab 05/11/21 0400 05/12/21 0404 05/13/21 0424 05/14/21 0422 05/15/21 0403  NA 145 144 150* 149* 147*  K 3.6 2.9* 3.6 3.3* 3.3*  CL 114* 112* 116* 117* 113*  CO2 23 24 26 27 27   GLUCOSE 89 97 106* 85 72  BUN 23 20 19 18 17   CREATININE 1.22* 0.96 0.94 0.95 0.84  CALCIUM 8.2* 8.1* 8.7* 8.3* 8.2*  MG 1.7  --   --   --  1.8    CBC: Recent Labs  Lab 05/12/21 0404 05/14/21 0422  WBC 9.1 7.0  NEUTROABS 7.0  --   HGB 13.0 11.6*  HCT 39.0 35.3*  MCV 98.5 100.3*  PLT 261 317     CBG: Recent Labs  Lab 05/08/21 1945  GLUCAP 120*     IMAGING STUDIES CT Abdomen Pelvis Wo Contrast  Result Date: 05/03/2021 CLINICAL DATA:  80 year old female with history of abdominal pain. EXAM: CT ABDOMEN AND PELVIS WITHOUT CONTRAST TECHNIQUE: Multidetector CT imaging of the abdomen and pelvis was performed following the standard protocol without IV contrast. COMPARISON:  No priors. FINDINGS: Lower chest: Atherosclerotic calcifications in the thoracic aorta as well as  the left main and left anterior descending coronary arteries. Hepatobiliary: Low-attenuation lesions in the central aspect of the liver measuring up to 1.4 cm in diameter, incompletely characterized on today's non-contrast CT examination, but statistically likely to represent cysts. No other larger more suspicious appearing hepatic lesions noted on today's noncontrast CT examination. Several calcified gallstones are noted in the gallbladder measuring up to 1.7 cm in diameter. Pancreas: No definite pancreatic mass or peripancreatic fluid collections or inflammatory changes noted on today's noncontrast CT examination. Spleen: Unremarkable. Adrenals/Urinary Tract: Low-attenuation lesions in both kidneys, incompletely characterized on today's non-contrast CT examination, but statistically likely to represent cysts, largest of which is in the lower pole of the left kidney measuring 8.8 x 5.7 cm. No hydroureteronephrosis. Urinary bladder is normal in appearance. Bilateral adrenal glands are normal in appearance. Stomach/Bowel: Unenhanced appearance of the stomach is normal. No pathologic dilatation of small bowel or colon. Numerous colonic diverticulae are noted, without definite surrounding inflammatory changes to suggest an acute diverticulitis at this time. The appendix is not confidently identified and may be surgically absent. Regardless, there are no inflammatory changes noted adjacent to the cecum to suggest the presence of an acute appendicitis at this time. Vascular/Lymphatic: Aortic  atherosclerosis. No lymphadenopathy noted in the abdomen or pelvis. Reproductive: Uterus is enlarged and heterogeneous in appearance with multiple partially calcified lesions, largest of which is in the posterior aspect of the uterine body measuring 8.5 cm in diameter. Unenhanced appearance of the ovaries is unremarkable. Other: No significant volume of ascites.  No pneumoperitoneum. Musculoskeletal: There are no aggressive appearing  lytic or blastic lesions noted in the visualized portions of the skeleton. IMPRESSION: 1. No acute findings are noted in the abdomen or pelvis to account for the patient's symptoms. 2. Colonic diverticulosis without evidence of acute diverticulitis at this time. 3. Fibroid uterus. 4. Aortic atherosclerosis, as well as left main and left anterior descending coronary artery disease. 5. Additional incidental findings, as above. Electronically Signed   By: Vinnie Langton M.D.   On: 05/03/2021 13:40   DG Chest 1 View  Result Date: 05/10/2021 CLINICAL DATA:  Increased difficulty in breathing EXAM: CHEST  1 VIEW COMPARISON:  Portable exam 1518 hours compared to 05/03/2021 FINDINGS: Normal heart size mediastinal contours. New patchy pulmonary infiltrate identified in BILATERAL upper lobes greater on RIGHT and at both lung bases greater on LEFT. Findings favor multifocal pneumonia. No pleural effusion or pneumothorax. Bones demineralized. IMPRESSION: New BILATERAL pulmonary infiltrates consistent with multifocal pneumonia. Electronically Signed   By: Lavonia Dana M.D.   On: 05/10/2021 15:49   CT Head Wo Contrast  Result Date: 05/03/2021 CLINICAL DATA:  AMS EXAM: CT HEAD WITHOUT CONTRAST CT CERVICAL SPINE WITHOUT CONTRAST TECHNIQUE: Multidetector CT imaging of the head and cervical spine was performed following the standard protocol without intravenous contrast. Multiplanar CT image reconstructions of the cervical spine were also generated. COMPARISON:  09/20/20, 02/05/21. FINDINGS: CT HEAD FINDINGS Brain: No evidence of acute infarction, hemorrhage, hydrocephalus, extra-axial collection or mass lesion/mass effect. Remote RIGHT parietal infarction. Periventricular white matter hypodensities consistent with sequela of chronic microvascular ischemic disease. Global parenchymal volume loss. Revisualization of multiple bilateral lacunar infarctions of the basal ganglia. Vascular: Vascular calcifications. Skull: No acute  fracture. Sinuses/Orbits: No acute finding. Other: None. CT CERVICAL SPINE FINDINGS Alignment: Unchanged minimal C3-4 anterolisthesis. Straightening of the cervical lordosis. No new spondylolisthesis. Skull base and vertebrae: No acute fracture. Soft tissues and spinal canal: No prevertebral fluid or swelling. No visible canal hematoma. Disc levels: Multilevel severe intervertebral disc space height loss at C4-5, C5-6, C6-7 and C7-T1. Multilevel facet arthropathy and uncovertebral hypertrophy. Osseous ankylosis of the RIGHT facets of C2-3. Posterior disc osteophyte complex results in severe canal narrowing at C5-6 and C6-7, unchanged in comparison to prior. Multilevel osseous neuroforaminal narrowing, most pronounced at LEFT C5-6 and C6-7. Upper chest: Unchanged LEFT greater than RIGHT apical scarring. Other: Unchanged appearance of multiple thyroid nodules. IMPRESSION: 1.  No acute intracranial abnormality. 2.  No acute fracture or static subluxation of the cervical spine. Electronically Signed   By: Valentino Saxon MD   On: 05/03/2021 11:59   CT Cervical Spine Wo Contrast  Result Date: 05/03/2021 CLINICAL DATA:  AMS EXAM: CT HEAD WITHOUT CONTRAST CT CERVICAL SPINE WITHOUT CONTRAST TECHNIQUE: Multidetector CT imaging of the head and cervical spine was performed following the standard protocol without intravenous contrast. Multiplanar CT image reconstructions of the cervical spine were also generated. COMPARISON:  09/20/20, 02/05/21. FINDINGS: CT HEAD FINDINGS Brain: No evidence of acute infarction, hemorrhage, hydrocephalus, extra-axial collection or mass lesion/mass effect. Remote RIGHT parietal infarction. Periventricular white matter hypodensities consistent with sequela of chronic microvascular ischemic disease. Global parenchymal volume loss. Revisualization of multiple bilateral lacunar infarctions  of the basal ganglia. Vascular: Vascular calcifications. Skull: No acute fracture. Sinuses/Orbits: No acute  finding. Other: None. CT CERVICAL SPINE FINDINGS Alignment: Unchanged minimal C3-4 anterolisthesis. Straightening of the cervical lordosis. No new spondylolisthesis. Skull base and vertebrae: No acute fracture. Soft tissues and spinal canal: No prevertebral fluid or swelling. No visible canal hematoma. Disc levels: Multilevel severe intervertebral disc space height loss at C4-5, C5-6, C6-7 and C7-T1. Multilevel facet arthropathy and uncovertebral hypertrophy. Osseous ankylosis of the RIGHT facets of C2-3. Posterior disc osteophyte complex results in severe canal narrowing at C5-6 and C6-7, unchanged in comparison to prior. Multilevel osseous neuroforaminal narrowing, most pronounced at LEFT C5-6 and C6-7. Upper chest: Unchanged LEFT greater than RIGHT apical scarring. Other: Unchanged appearance of multiple thyroid nodules. IMPRESSION: 1.  No acute intracranial abnormality. 2.  No acute fracture or static subluxation of the cervical spine. Electronically Signed   By: Valentino Saxon MD   On: 05/03/2021 11:59   DG Chest Port 1 View  Result Date: 05/03/2021 CLINICAL DATA:  Sepsis EXAM: PORTABLE CHEST 1 VIEW COMPARISON:  September 19, 2020 FINDINGS: The cardiomediastinal silhouette is unchanged in contour.Unchanged elevation of the RIGHT hemidiaphragm. Atherosclerotic calcifications. No pleural effusion. No pneumothorax. No acute pleuroparenchymal abnormality. Visualized abdomen is unremarkable. Multilevel degenerative changes of the thoracic spine. IMPRESSION: No acute cardiopulmonary abnormality. Electronically Signed   By: Valentino Saxon MD   On: 05/03/2021 11:43    DISCHARGE EXAMINATION: Vitals:   05/14/21 1300 05/14/21 1356 05/14/21 2100 05/15/21 0500  BP: (!) 140/94     Pulse: (!) 56   60  Resp: (!) 21     Temp:  97.7 F (36.5 C) 97.8 F (36.6 C) 97.6 F (36.4 C)  TempSrc:  Oral  Oral  SpO2: (!) 88%   93%  Weight:      Height:       General appearance: Awake alert.  In no distress.   Distracted Resp: Noted to be mildly tachypneic without any use of accessory muscles.  Crackles bilateral bases right more than left.  No wheezing or rhonchi. Cardio: S1-S2 is normal regular.  No S3-S4.  No rubs murmurs or bruit GI: Abdomen is soft.  Nontender nondistended.  Bowel sounds are present normal.  No masses organomegaly    DISPOSITION: SNF  Discharge Instructions     Call MD for:  difficulty breathing, headache or visual disturbances   Complete by: As directed    Call MD for:  extreme fatigue   Complete by: As directed    Call MD for:  persistant dizziness or light-headedness   Complete by: As directed    Call MD for:  persistant nausea and vomiting   Complete by: As directed    Call MD for:  severe uncontrolled pain   Complete by: As directed    Call MD for:  temperature >100.4   Complete by: As directed    Discharge instructions   Complete by: As directed    Please review instructions on the discharge summary.  You were cared for by a hospitalist during your hospital stay. If you have any questions about your discharge medications or the care you received while you were in the hospital after you are discharged, you can call the unit and asked to speak with the hospitalist on call if the hospitalist that took care of you is not available. Once you are discharged, your primary care physician will handle any further medical issues. Please note that NO REFILLS for any discharge  medications will be authorized once you are discharged, as it is imperative that you return to your primary care physician (or establish a relationship with a primary care physician if you do not have one) for your aftercare needs so that they can reassess your need for medications and monitor your lab values. If you do not have a primary care physician, you can call [phone] for a physician referral.   Increase activity slowly   Complete by: As directed    No wound care   Complete by: As directed            Allergies as of 05/15/2021       Reactions   Amoxicillin Nausea Only   Ceclor [cefaclor] Hives   Levaquin [levofloxacin] Other (See Comments)   Nystatin Other (See Comments)   Black scale around teeth.        Medication List     STOP taking these medications    Biotin 2500 MCG Caps   Co Q-10 100 MG Caps   hydrochlorothiazide 25 MG tablet Commonly known as: HYDRODIURIL   metoprolol succinate 50 MG 24 hr tablet Commonly known as: TOPROL-XL   nitrofurantoin (macrocrystal-monohydrate) 100 MG capsule Commonly known as: MACROBID       TAKE these medications    amiodarone 200 MG tablet Commonly known as: PACERONE Take 1 tablet (200 mg total) by mouth daily.   apixaban 5 MG Tabs tablet Commonly known as: ELIQUIS Take 1 tablet (5 mg total) by mouth 2 (two) times daily.   Calcium-Vitamin D3 600-200 MG-UNIT Tabs Take 1 tablet by mouth daily.   carboxymethylcellulose 1 % ophthalmic solution Place 2 drops into both eyes daily.   clotrimazole 1 % cream Commonly known as: LOTRIMIN Apply 1 application topically 2 (two) times daily. Apply to abdominal folds topically two times daily for yeast   docusate sodium 100 MG capsule Commonly known as: COLACE Take 100 mg by mouth See admin instructions. 100mg  on Monday and Friday   loperamide 2 MG tablet Commonly known as: IMODIUM A-D Take 2 mg by mouth every 6 (six) hours as needed for diarrhea or loose stools.   Magnesium 250 MG Tabs Take 750 mg by mouth daily.   melatonin 5 MG Tabs Take 5 mg by mouth at bedtime.   memantine 10 MG tablet Commonly known as: NAMENDA Take 1 tablet (10 mg total) by mouth 2 (two) times daily. Please call [phone] to schedule appt.   metoprolol tartrate 50 MG tablet Commonly known as: LOPRESSOR Take 1 tablet (50 mg total) by mouth 2 (two) times daily.   multivitamin with minerals Tabs tablet Take 1 tablet by mouth daily.   Omega 3 1200 MG Caps Take 1,200 mg by mouth  daily.   potassium chloride SA 20 MEQ tablet Commonly known as: KLOR-CON Take 1 tablet (20 mEq total) by mouth daily.   rosuvastatin 10 MG tablet Commonly known as: CRESTOR Take 10 mg by mouth daily.   valACYclovir 500 MG tablet Commonly known as: VALTREX Take 500 mg by mouth daily.   vitamin B-12 1000 MCG tablet Commonly known as: CYANOCOBALAMIN Take 1,000 mcg by mouth daily. What changed: Another medication with the same name was removed. Continue taking this medication, and follow the directions you see here.   Vitamin D 50 MCG (2000 UT) tablet Take 2,000 Units by mouth daily.          Follow-up Information     Freada Bergeron, MD Follow up in 1 week(s).  Specialties: Cardiology, Radiology Contact information: [address] [address] [phone]                 TOTAL DISCHARGE TIME: 35 mins  [place] [employer] operational officer on [email or web address]  05/15/2021, 10:29 AM

## 2021-05-15 NOTE — Progress Notes (Signed)
Patient transported via EMS to [facility]. Attempted to cal husband to notify of transfer, no response.

## 2021-05-21 ENCOUNTER — Inpatient Hospital Stay (HOSPITAL_COMMUNITY)
Admission: EM | Admit: 2021-05-21 | Discharge: 2021-05-21 | DRG: 951 | Disposition: A | Discharge status: Hospice | Payer: Medicare Other | Source: Skilled Nursing Facility | Attending: Internal Medicine | Admitting: Internal Medicine

## 2021-05-21 ENCOUNTER — Emergency Department (HOSPITAL_COMMUNITY): Payer: Medicare Other

## 2021-05-21 ENCOUNTER — Encounter (HOSPITAL_COMMUNITY): Payer: Self-pay

## 2021-05-21 ENCOUNTER — Other Ambulatory Visit: Payer: Self-pay

## 2021-05-21 DIAGNOSIS — R109 Unspecified abdominal pain: Secondary | ICD-10-CM

## 2021-05-21 DIAGNOSIS — N320 Bladder-neck obstruction: Secondary | ICD-10-CM

## 2021-05-21 DIAGNOSIS — R1084 Generalized abdominal pain: Secondary | ICD-10-CM | POA: Diagnosis not present

## 2021-05-21 DIAGNOSIS — N179 Acute kidney failure, unspecified: Secondary | ICD-10-CM | POA: Diagnosis not present

## 2021-05-21 DIAGNOSIS — E785 Hyperlipidemia, unspecified: Secondary | ICD-10-CM | POA: Diagnosis not present

## 2021-05-21 DIAGNOSIS — Z888 Allergy status to other drugs, medicaments and biological substances status: Secondary | ICD-10-CM

## 2021-05-21 DIAGNOSIS — N139 Obstructive and reflux uropathy, unspecified: Secondary | ICD-10-CM | POA: Diagnosis not present

## 2021-05-21 DIAGNOSIS — Z823 Family history of stroke: Secondary | ICD-10-CM | POA: Diagnosis not present

## 2021-05-21 DIAGNOSIS — R0902 Hypoxemia: Secondary | ICD-10-CM | POA: Diagnosis not present

## 2021-05-21 DIAGNOSIS — F0390 Unspecified dementia without behavioral disturbance: Secondary | ICD-10-CM | POA: Diagnosis present

## 2021-05-21 DIAGNOSIS — Z880 Allergy status to penicillin: Secondary | ICD-10-CM

## 2021-05-21 DIAGNOSIS — R188 Other ascites: Secondary | ICD-10-CM | POA: Diagnosis not present

## 2021-05-21 DIAGNOSIS — R6889 Other general symptoms and signs: Secondary | ICD-10-CM | POA: Diagnosis not present

## 2021-05-21 DIAGNOSIS — R404 Transient alteration of awareness: Secondary | ICD-10-CM | POA: Diagnosis not present

## 2021-05-21 DIAGNOSIS — K631 Perforation of intestine (nontraumatic): Secondary | ICD-10-CM

## 2021-05-21 DIAGNOSIS — Z8616 Personal history of COVID-19: Secondary | ICD-10-CM | POA: Diagnosis not present

## 2021-05-21 DIAGNOSIS — Z7189 Other specified counseling: Secondary | ICD-10-CM

## 2021-05-21 DIAGNOSIS — Z8673 Personal history of transient ischemic attack (TIA), and cerebral infarction without residual deficits: Secondary | ICD-10-CM

## 2021-05-21 DIAGNOSIS — R0689 Other abnormalities of breathing: Secondary | ICD-10-CM | POA: Diagnosis not present

## 2021-05-21 DIAGNOSIS — K8020 Calculus of gallbladder without cholecystitis without obstruction: Secondary | ICD-10-CM | POA: Diagnosis not present

## 2021-05-21 DIAGNOSIS — Z83438 Family history of other disorder of lipoprotein metabolism and other lipidemia: Secondary | ICD-10-CM

## 2021-05-21 DIAGNOSIS — G4700 Insomnia, unspecified: Secondary | ICD-10-CM | POA: Diagnosis not present

## 2021-05-21 DIAGNOSIS — Z87891 Personal history of nicotine dependence: Secondary | ICD-10-CM

## 2021-05-21 DIAGNOSIS — R410 Disorientation, unspecified: Secondary | ICD-10-CM | POA: Diagnosis not present

## 2021-05-21 DIAGNOSIS — J9601 Acute respiratory failure with hypoxia: Secondary | ICD-10-CM | POA: Diagnosis present

## 2021-05-21 DIAGNOSIS — R58 Hemorrhage, not elsewhere classified: Secondary | ICD-10-CM | POA: Diagnosis not present

## 2021-05-21 DIAGNOSIS — Z7901 Long term (current) use of anticoagulants: Secondary | ICD-10-CM

## 2021-05-21 DIAGNOSIS — U071 COVID-19: Secondary | ICD-10-CM | POA: Diagnosis not present

## 2021-05-21 DIAGNOSIS — Z66 Do not resuscitate: Secondary | ICD-10-CM | POA: Diagnosis present

## 2021-05-21 DIAGNOSIS — J90 Pleural effusion, not elsewhere classified: Secondary | ICD-10-CM | POA: Diagnosis not present

## 2021-05-21 DIAGNOSIS — Z515 Encounter for palliative care: Secondary | ICD-10-CM | POA: Diagnosis not present

## 2021-05-21 DIAGNOSIS — I4891 Unspecified atrial fibrillation: Secondary | ICD-10-CM | POA: Diagnosis present

## 2021-05-21 DIAGNOSIS — I10 Essential (primary) hypertension: Secondary | ICD-10-CM | POA: Diagnosis not present

## 2021-05-21 DIAGNOSIS — J690 Pneumonitis due to inhalation of food and vomit: Secondary | ICD-10-CM | POA: Diagnosis not present

## 2021-05-21 DIAGNOSIS — Z8249 Family history of ischemic heart disease and other diseases of the circulatory system: Secondary | ICD-10-CM

## 2021-05-21 DIAGNOSIS — Z743 Need for continuous supervision: Secondary | ICD-10-CM | POA: Diagnosis not present

## 2021-05-21 LAB — COMPREHENSIVE METABOLIC PANEL
ALT: 40 U/L (ref 0–44)
AST: 40 U/L (ref 15–41)
Albumin: 2.2 g/dL — ABNORMAL LOW (ref 3.5–5.0)
Alkaline Phosphatase: 82 U/L (ref 38–126)
Anion gap: 13 (ref 5–15)
BUN: 37 mg/dL — ABNORMAL HIGH (ref 8–23)
CO2: 21 mmol/L — ABNORMAL LOW (ref 22–32)
Calcium: 8.8 mg/dL — ABNORMAL LOW (ref 8.9–10.3)
Chloride: 105 mmol/L (ref 98–111)
Creatinine, Ser: 2.45 mg/dL — ABNORMAL HIGH (ref 0.44–1.00)
GFR, Estimated: 19 mL/min — ABNORMAL LOW (ref >60)
Glucose, Bld: 130 mg/dL — ABNORMAL HIGH (ref 70–99)
Potassium: 5.2 mmol/L — ABNORMAL HIGH (ref 3.5–5.1)
Sodium: 139 mmol/L (ref 135–145)
Total Bilirubin: 1 mg/dL (ref 0.3–1.2)
Total Protein: 6.2 g/dL — ABNORMAL LOW (ref 6.5–8.1)

## 2021-05-21 LAB — CBC WITH DIFFERENTIAL/PLATELET
Abs Immature Granulocytes: 0 10*3/uL (ref 0.00–0.07)
Band Neutrophils: 4 %
Basophils Absolute: 0 10*3/uL (ref 0.0–0.1)
Basophils Relative: 0 %
Eosinophils Absolute: 0.1 10*3/uL (ref 0.0–0.5)
Eosinophils Relative: 4 %
HCT: 42.5 % (ref 36.0–46.0)
Hemoglobin: 13.9 g/dL (ref 12.0–15.0)
Lymphocytes Relative: 18 %
Lymphs Abs: 0.6 10*3/uL — ABNORMAL LOW (ref 0.7–4.0)
MCH: 33.6 pg (ref 26.0–34.0)
MCHC: 32.7 g/dL (ref 30.0–36.0)
MCV: 102.7 fL — ABNORMAL HIGH (ref 80.0–100.0)
Monocytes Absolute: 0.2 10*3/uL (ref 0.1–1.0)
Monocytes Relative: 6 %
Neutro Abs: 2.4 10*3/uL (ref 1.7–7.7)
Neutrophils Relative %: 68 %
Platelets: 466 10*3/uL — ABNORMAL HIGH (ref 150–400)
RBC: 4.14 MIL/uL (ref 3.87–5.11)
RDW: 14.9 % (ref 11.5–15.5)
WBC: 3.4 10*3/uL — ABNORMAL LOW (ref 4.0–10.5)
nRBC: 0 % (ref 0.0–0.2)
nRBC: 3 /100 WBC — ABNORMAL HIGH

## 2021-05-21 LAB — I-STAT ARTERIAL BLOOD GAS, ED
Acid-base deficit: 3 mmol/L — ABNORMAL HIGH (ref 0.0–2.0)
Bicarbonate: 20.2 mmol/L (ref 20.0–28.0)
Calcium, Ion: 1.19 mmol/L (ref 1.15–1.40)
HCT: 43 % (ref 36.0–46.0)
Hemoglobin: 14.6 g/dL (ref 12.0–15.0)
O2 Saturation: 94 %
Patient temperature: 98
Potassium: 4.8 mmol/L (ref 3.5–5.1)
Sodium: 140 mmol/L (ref 135–145)
TCO2: 21 mmol/L — ABNORMAL LOW (ref 22–32)
pCO2 arterial: 30.4 mmHg — ABNORMAL LOW (ref 32.0–48.0)
pH, Arterial: 7.429 (ref 7.350–7.450)
pO2, Arterial: 68 mmHg — ABNORMAL LOW (ref 83.0–108.0)

## 2021-05-21 LAB — RESP PANEL BY RT-PCR (FLU A&B, COVID) ARPGX2
Influenza A by PCR: NEGATIVE
Influenza B by PCR: NEGATIVE
SARS Coronavirus 2 by RT PCR: POSITIVE — AB

## 2021-05-21 LAB — TYPE AND SCREEN
ABO/RH(D): O POS
Antibody Screen: NEGATIVE

## 2021-05-21 LAB — I-STAT CHEM 8, ED
BUN: 37 mg/dL — ABNORMAL HIGH (ref 8–23)
Calcium, Ion: 1.13 mmol/L — ABNORMAL LOW (ref 1.15–1.40)
Chloride: 108 mmol/L (ref 98–111)
Creatinine, Ser: 2.4 mg/dL — ABNORMAL HIGH (ref 0.44–1.00)
Glucose, Bld: 125 mg/dL — ABNORMAL HIGH (ref 70–99)
HCT: 45 % (ref 36.0–46.0)
Hemoglobin: 15.3 g/dL — ABNORMAL HIGH (ref 12.0–15.0)
Potassium: 5 mmol/L (ref 3.5–5.1)
Sodium: 138 mmol/L (ref 135–145)
TCO2: 22 mmol/L (ref 22–32)

## 2021-05-21 LAB — ABO/RH: ABO/RH(D): O POS

## 2021-05-21 LAB — TROPONIN I (HIGH SENSITIVITY): Troponin I (High Sensitivity): 19 ng/L — ABNORMAL HIGH (ref <18)

## 2021-05-21 MED ORDER — HYDROMORPHONE HCL 1 MG/ML IJ SOLN
0.5000 mg | Freq: Once | INTRAMUSCULAR | Status: AC
  Administered 2021-05-21: 0.5 mg via INTRAVENOUS

## 2021-05-21 MED ORDER — HALOPERIDOL 0.5 MG PO TABS
0.5000 mg | ORAL_TABLET | ORAL | Status: DC | PRN
  Filled 2021-05-21: qty 1

## 2021-05-21 MED ORDER — VANCOMYCIN HCL 1000 MG/200ML IV SOLN: 1000.0000 mg | INTRAVENOUS | Status: DC

## 2021-05-21 MED ORDER — HYDROMORPHONE HCL 1 MG/ML IJ SOLN
0.5000 mg | INTRAMUSCULAR | Status: AC
  Administered 2021-05-21: 0.5 mg via INTRAVENOUS
  Filled 2021-05-21: qty 1

## 2021-05-21 MED ORDER — GLYCOPYRROLATE 1 MG PO TABS
1.0000 mg | ORAL_TABLET | ORAL | Status: DC | PRN
  Filled 2021-05-21: qty 1

## 2021-05-21 MED ORDER — BIOTENE DRY MOUTH MT LIQD: 15.0000 mL | OROMUCOSAL | Status: DC | PRN

## 2021-05-21 MED ORDER — HYDROMORPHONE BOLUS VIA INFUSION
0.5000 mg | INTRAVENOUS | Status: DC | PRN
  Filled 2021-05-21: qty 1

## 2021-05-21 MED ORDER — VANCOMYCIN HCL 1750 MG/350ML IV SOLN
1750.0000 mg | Freq: Once | INTRAVENOUS | Status: DC
  Filled 2021-05-21: qty 350

## 2021-05-21 MED ORDER — PIPERACILLIN-TAZOBACTAM 3.375 G IVPB
3.3750 g | Freq: Three times a day (TID) | INTRAVENOUS | Status: DC
  Administered 2021-05-21: 3.375 g via INTRAVENOUS
  Filled 2021-05-21: qty 50

## 2021-05-21 MED ORDER — SODIUM CHLORIDE 0.9 % IV BOLUS
1000.0000 mL | Freq: Once | INTRAVENOUS | Status: AC
  Administered 2021-05-21: 1000 mL via INTRAVENOUS

## 2021-05-21 MED ORDER — HALOPERIDOL LACTATE 5 MG/ML IJ SOLN: 0.5000 mg | INTRAMUSCULAR | Status: DC | PRN

## 2021-05-21 MED ORDER — ONDANSETRON HCL 4 MG/2ML IJ SOLN: 4.0000 mg | Freq: Four times a day (QID) | INTRAMUSCULAR | Status: DC | PRN

## 2021-05-21 MED ORDER — GLYCOPYRROLATE 0.2 MG/ML IJ SOLN: 0.2000 mg | INTRAMUSCULAR | Status: DC | PRN

## 2021-05-21 MED ORDER — ONDANSETRON HCL 4 MG/2ML IJ SOLN
4.0000 mg | Freq: Four times a day (QID) | INTRAMUSCULAR | Status: DC
  Administered 2021-05-21: 4 mg via INTRAVENOUS
  Filled 2021-05-21: qty 2

## 2021-05-21 MED ORDER — ONDANSETRON 4 MG PO TBDP: 4.0000 mg | ORAL_TABLET | Freq: Four times a day (QID) | ORAL | Status: DC | PRN

## 2021-05-21 MED ORDER — POLYVINYL ALCOHOL 1.4 % OP SOLN
1.0000 [drp] | Freq: Four times a day (QID) | OPHTHALMIC | Status: DC | PRN
  Filled 2021-05-21: qty 15

## 2021-05-21 MED ORDER — SODIUM CHLORIDE 0.9 % IV SOLN
0.2500 mg/h | INTRAVENOUS | Status: DC
  Administered 2021-05-21: 0.25 mg/h via INTRAVENOUS
  Filled 2021-05-21: qty 5

## 2021-05-21 MED ORDER — HALOPERIDOL LACTATE 2 MG/ML PO CONC
0.5000 mg | ORAL | Status: DC | PRN
  Filled 2021-05-21: qty 0.3

## 2021-05-21 NOTE — ED Notes (Signed)
Attempted to call report

## 2021-05-21 NOTE — ED Notes (Signed)
94.5 ml of dilaudid drip wasted in sink with Ryan.

## 2021-05-21 NOTE — Progress Notes (Signed)
Manufacturing engineer [facility]) Hospital Liaison note.     This patient is approved to transfer to [facility] today.   [facility] will notify TOC when registration paperwork has been completed to arrange transport.   RN please call report to [phone].   A Please do not hesitate to call with questions.    Thank you,   Farrel Gordon, RN, [facility] Liaison  [phone]

## 2021-05-21 NOTE — Progress Notes (Signed)
Manufacturing engineer [facility]) Hospital Liaison note.    This patient is approved to transfer to [facility] today.   Once the Covid test has resulted, patient can transfer to [facility].  RN please call report to [phone].   A Please do not hesitate to call with questions.     Thank you,   Farrel Gordon, RN, [place] [facility] Liaison [phone]

## 2021-05-21 NOTE — ED Notes (Addendum)
Dr Tamala Julian at bedside, reports that pt will not be going to the floor. Will wean pt off high flow and pt will go to [facility] under hospice care today. Respiratory at bedside to wean pt off high flow.

## 2021-05-21 NOTE — Discharge Instructions (Addendum)
Transport to [facility]

## 2021-05-21 NOTE — Consult Note (Signed)
Called to admit the patient by Dr. Reather Converse.  Jennifer Arroyo is a 80 year old female with past medical history significant of advanced dementia, HTN, HLD, A. fib on Eliquis, CVA, and recent admission 5/29 -6/10 for acute respiratory failure with hypoxia secondary to aspiration with concern for urinary tract infection.  Patient presented today with complaints of abdominal pain with hematemesis found to have a intestinal perforation, acute kidney injury, and acute respiratory failure with hypoxia.  Patient had initially been given treatment and she will placed on empiric antibiotics.  She was evaluated by general surgery, but overall noted to be a poor surgical candidate.  Palliative care had been formally consulted and patient was switched over to comfort care measures only.  [facility] has been called likely need of admission.  However, patient was able to be obtained a bed at [facility] and is just pending a repeat COVID test at this time.  Discontinued the patient's inpatient bed and notified of patient placement of the change.

## 2021-05-21 NOTE — ED Notes (Signed)
Respiratory consulted to assist with high flow O2 and transporting pt to the floor

## 2021-05-21 NOTE — Progress Notes (Signed)
Pharmacy Antibiotic Note  Jennifer Arroyo is a 80 y.o. female admitted on 05/21/2021 with  intra-abdominal infection and pneumonia .  Pharmacy has been consulted for vancomycin and Zosyn dosing.   WBC 3.4. Noted renal dysfunction. Baseline Scr 0.84, now 2.45. Concern for bowel perforation and and bladder obstruction.   Plan: Start vancomycin 1750mg  loading dose Then vancomycin 1000mg  q48 (nomogram dosing) Continue Zosyn 3.375G IV q8h to be infused over 4 hours Trend WBC, temp, renal function   Vanc 6/16 >> Cefepime 6/16 >>   Temp (24hrs), Avg:97.8 F (36.6 C), Min:97.5 F (36.4 C), Max:98 F (36.7 C)  Recent Labs  Lab 05/15/21 0403 05/21/21 0535 05/21/21 0556  WBC  --  3.4*  --   CREATININE 0.84 2.45* 2.40*    Estimated Creatinine Clearance: 20.7 mL/min (A) (by C-G formula based on SCr of 2.4 mg/dL (H)).    Allergies  Allergen Reactions   Amoxicillin Nausea Only   Ceclor [Cefaclor] Hives   Levaquin [Levofloxacin] Other (See Comments)   Nystatin Other (See Comments)    Black scale around teeth.    Norina Buzzard, PharmD PGY1 Pharmacy Resident 05/21/2021 8:19 AM

## 2021-05-21 NOTE — ED Notes (Signed)
Unsuccessful foley insertion attempt

## 2021-05-21 NOTE — Progress Notes (Signed)
Pharmacy Antibiotic Note  KASSANDRA MERIWEATHER is a 80 y.o. female admitted on 05/21/2021 with  intra-abdominal infection .  Pharmacy has been consulted for Zosyn dosing. WBC 3.4. Noted renal dysfunction.   Plan: Zosyn 3.375G IV q8h to be infused over 4 hours Trend WBC, temp, renal function  F/U infectious work-up   Temp (24hrs), Avg:98 F (36.7 C), Min:98 F (36.7 C), Max:98 F (36.7 C)  Recent Labs  Lab 05/15/21 0403 05/21/21 0535 05/21/21 0556  WBC  --  3.4*  --   CREATININE 0.84 2.45* 2.40*    CrCl cannot be calculated (Unknown ideal weight.).    Allergies  Allergen Reactions   Amoxicillin Nausea Only   Ceclor [Cefaclor] Hives   Levaquin [Levofloxacin] Other (See Comments)   Nystatin Other (See Comments)    Black scale around teeth.    Narda Bonds, PharmD, BCPS Clinical Pharmacist Phone: [phone]

## 2021-05-21 NOTE — ED Triage Notes (Signed)
Pt BIB EMS from [facility]. Per EMS staff was not able to tell them much bedsides pt has been having sob. Pt also started having coffee ground emesis . Pt is c/o abd pain.

## 2021-05-21 NOTE — ED Provider Notes (Signed)
[facility], [place] EMERGENCY DEPARTMENT Provider Note   CSN: [id number] Arrival date & time: 05/21/21  2778     History Chief Complaint  Patient presents with   Abdominal Pain    Jennifer Arroyo is a 80 y.o. female.  The history is provided by the patient and medical records. The history is limited by the condition of the patient. No language interpreter was used.  Abdominal Pain Pain location:  Generalized Pain quality: aching   Pain severity:  Severe Onset quality:  Unable to specify Timing:  Constant Chronicity:  New Ineffective treatments:  None tried Associated symptoms: fatigue and vomiting   Associated symptoms: no chest pain, no cough and no shortness of breath    LVL 5 caveat for acuity and mental status      Past Medical History:  Diagnosis Date   Bilateral bunions    hammer toe   Cervical os stenosis    CVA (cerebral vascular accident) ([place])    Gallstones    Hemangioma of liver    probable   Hemorrhoids    Hyperlipemia    Hypertension    Insomnia    Left bundle branch block    Left bundle branch block    Low back pain    Memory loss    Plantar fasciitis    Rectocele    Renal cyst    Stroke ([place]) 01/2018    Patient Active Problem List   Diagnosis Date Noted   Sepsis secondary to UTI ([place]) 05/07/2021   Obesity 05/07/2021   Pressure injury of skin 05/04/2021   Sepsis ([place]) 05/03/2021   CVA (cerebral vascular accident) ([place]) 09/19/2020   New onset a-fib ([place]) 09/19/2020   HTN (hypertension) 09/19/2020   Hyperlipidemia 09/19/2020   Dementia without behavioral disturbance ([place]) 09/19/2020   Fall at home, initial encounter 09/19/2020   Bacteria in urine 09/19/2020   Atrial fibrillation with RVR ([place])    Memory loss 01/11/2019   Gait abnormality 01/11/2019    Past Surgical History:  Procedure Laterality Date   APPENDECTOMY     COLONOSCOPY     TONSILLECTOMY       OB History   No obstetric history on file.     Family  History  Problem Relation Age of Onset   Stroke Mother    Cancer Father    Healthy Brother    Liver cancer Father    Hypertension Brother    Hyperlipidemia Brother    Rheumatic fever Mother    Heart disease Mother     Social History   Tobacco Use   Smoking status: Former    Packs/day: 0.50    Pack years: 0.00    Types: Cigarettes    Quit date: 12/08/1978    Years since quitting: 42.4   Smokeless tobacco: Never   Tobacco comments:    quit at age 76, 31 PPD x 10 year history  Vaping Use   Vaping Use: Never used  Substance Use Topics   Alcohol use: Never   Drug use: Yes    Comment: back in the 1980s. hasnt smoked since then    Home Medications Prior to Admission medications   Medication Sig Start Date End Date Taking? Authorizing Provider  amiodarone (PACERONE) 200 MG tablet Take 1 tablet (200 mg total) by mouth daily. 05/15/21   Bonnielee Haff, MD  apixaban (ELIQUIS) 5 MG TABS tablet Take 1 tablet (5 mg total) by mouth 2 (two) times daily. 11/17/20   Gwyndolyn Kaufman  E, MD  Calcium Carb-Cholecalciferol (CALCIUM-VITAMIN D3) 600-200 MG-UNIT TABS Take 1 tablet by mouth daily.    [provider]  carboxymethylcellulose 1 % ophthalmic solution Place 2 drops into both eyes daily.    [provider]  Cholecalciferol (VITAMIN D) 50 MCG (2000 UT) tablet Take 2,000 Units by mouth daily.    [provider]  clotrimazole (LOTRIMIN) 1 % cream Apply 1 application topically 2 (two) times daily. Apply to abdominal folds topically two times daily for yeast    [provider]  docusate sodium (COLACE) 100 MG capsule Take 100 mg by mouth See admin instructions. 100mg  on Monday and Friday    [provider]  loperamide (IMODIUM A-D) 2 MG tablet Take 2 mg by mouth every 6 (six) hours as needed for diarrhea or loose stools.    [provider]  Magnesium 250 MG TABS Take 750 mg by mouth daily.    [provider]  melatonin 5 MG TABS  Take 5 mg by mouth at bedtime.    [provider]  memantine (NAMENDA) 10 MG tablet Take 1 tablet (10 mg total) by mouth 2 (two) times daily. Please call [phone] to schedule appt. 01/06/21   Marcial Pacas, MD  metoprolol tartrate (LOPRESSOR) 50 MG tablet Take 1 tablet (50 mg total) by mouth 2 (two) times daily. 05/15/21   Bonnielee Haff, MD  Multiple Vitamin (MULTIVITAMIN WITH MINERALS) TABS tablet Take 1 tablet by mouth daily.    [provider]  Omega 3 1200 MG CAPS Take 1,200 mg by mouth daily.    [provider]  potassium chloride SA (KLOR-CON) 20 MEQ tablet Take 1 tablet (20 mEq total) by mouth daily. 02/05/21   Daleen Bo, MD  rosuvastatin (CRESTOR) 10 MG tablet Take 10 mg by mouth daily.    [provider]  valACYclovir (VALTREX) 500 MG tablet Take 500 mg by mouth daily.  07/28/20   [provider]  vitamin B-12 (CYANOCOBALAMIN) 1000 MCG tablet Take 1,000 mcg by mouth daily.    [provider]    Allergies    Amoxicillin, Ceclor [cefaclor], Levaquin [levofloxacin], and Nystatin  Review of Systems   Review of Systems  Unable to perform ROS: Mental status change  Constitutional:  Positive for fatigue.  HENT:  Negative for congestion.   Respiratory:  Negative for cough, chest tightness and shortness of breath.   Cardiovascular:  Negative for chest pain.  Gastrointestinal:  Positive for abdominal pain and vomiting.  Musculoskeletal:  Negative for back pain.  Psychiatric/Behavioral:  Negative for agitation.    Physical Exam Updated Vital Signs BP (!) 88/71   Pulse 81   Resp (!) 25   SpO2 (!) 88%   Physical Exam Vitals and nursing note reviewed.  Constitutional:      General: She is not in acute distress.    Appearance: She is well-developed. She is ill-appearing and diaphoretic. She is not toxic-appearing.  HENT:     Head: Normocephalic and atraumatic.  Eyes:     Conjunctiva/sclera: Conjunctivae normal.  Cardiovascular:      Rate and Rhythm: Normal rate and regular rhythm.     Heart sounds: No murmur heard. Pulmonary:     Effort: Respiratory distress present.     Breath sounds: Rhonchi present.  Chest:     Chest wall: No tenderness.  Abdominal:     General: Abdomen is flat. Bowel sounds are decreased. There is distension.     Palpations: Abdomen is soft.  Tenderness: There is generalized abdominal tenderness.  Musculoskeletal:     Cervical back: Neck supple.  Skin:    General: Skin is warm.     Capillary Refill: Capillary refill takes less than 2 seconds.     Coloration: Skin is pale.     Findings: No rash.  Neurological:     General: No focal deficit present.     Mental Status: She is alert.  Psychiatric:        Mood and Affect: Mood normal.    ED Results / Procedures / Treatments   Labs (all labs ordered are listed, but only abnormal results are displayed) Labs Reviewed  CBC WITH DIFFERENTIAL/PLATELET - Abnormal; Notable for the following components:      Result Value   WBC 3.4 (*)    MCV 102.7 (*)    Platelets 466 (*)    Lymphs Abs 0.6 (*)    nRBC 3 (*)    All other components within normal limits  COMPREHENSIVE METABOLIC PANEL - Abnormal; Notable for the following components:   Potassium 5.2 (*)    CO2 21 (*)    Glucose, Bld 130 (*)    BUN 37 (*)    Creatinine, Ser 2.45 (*)    Calcium 8.8 (*)    Total Protein 6.2 (*)    Albumin 2.2 (*)    GFR, Estimated 19 (*)    All other components within normal limits  I-STAT CHEM 8, ED - Abnormal; Notable for the following components:   BUN 37 (*)    Creatinine, Ser 2.40 (*)    Glucose, Bld 125 (*)    Calcium, Ion 1.13 (*)    Hemoglobin 15.3 (*)    All other components within normal limits  I-STAT ARTERIAL BLOOD GAS, ED - Abnormal; Notable for the following components:   pCO2 arterial 30.4 (*)    pO2, Arterial 68 (*)    TCO2 21 (*)    Acid-base deficit 3.0 (*)    All other components within normal limits  TROPONIN I (HIGH  SENSITIVITY) - Abnormal; Notable for the following components:   Troponin I (High Sensitivity) 19 (*)    All other components within normal limits  BLOOD GAS, ARTERIAL  LACTIC ACID, PLASMA  LACTIC ACID, PLASMA  LIPASE, BLOOD  TYPE AND SCREEN  ABO/RH  TROPONIN I (HIGH SENSITIVITY)    EKG EKG Interpretation  Date/Time:  Thursday May 21 2021 05:11:26 EDT Ventricular Rate:  74 PR Interval:  132 QRS Duration: 116 QT Interval:  454 QTC Calculation: 504 R Axis:   103 Text Interpretation: Sinus rhythm Nonspecific intraventricular conduction delay Probable anteroseptal infarct, recent when compared to prior, slower rate and now sinus. NO STEMI Confirmed by Antony Blackbird [phone]) on 05/21/2021 6:40:33 AM  Radiology DG Chest Portable 1 View  Addendum Date: 05/21/2021   ADDENDUM REPORT: 05/21/2021 06:25 ADDENDUM: Critical Value/emergent results were called by telephone at the time of interpretation on 05/21/2021 at 6:25 am to provider [facility] , who verbally acknowledged these results. Electronically Signed   By: Lovena Le M.D.   On: 05/21/2021 06:25   Result Date: 05/21/2021 CLINICAL DATA:  Hypoxia EXAM: PORTABLE CHEST 1 VIEW COMPARISON:  Radiograph 05/10/2021 FINDINGS: Lung volumes are low. Some persistent biapical patchy opacities with more diffuse coarsened interstitial opacities and basilar atelectatic changes. No pneumothorax or visible effusion. Stable cardiomediastinal contours with a calcified aorta. Subdiaphragmatic lucency on the right, concerning for possible subdiaphragmatic free air. IMPRESSION: New right subdiaphragmatic lucency, concerning for  possible free air. Persistent patchy opacities in bilateral apices similar to comparison prior. Could reflect residual infection or inflammation. Electronically Signed: By: Lovena Le M.D. On: 05/21/2021 06:21   CT CHEST ABDOMEN PELVIS WO CONTRAST  Addendum Date: 05/21/2021   ADDENDUM REPORT: 05/21/2021 07:35 ADDENDUM: Study  discussed by telephone with Dr. Marda Stalker on 05/21/2021 at 0731 hours. Electronically Signed   By: Genevie Ann M.D.   On: 05/21/2021 07:35   Result Date: 05/21/2021 CLINICAL DATA:  80 year old female with shortness of breath, abdominal pain, coffee-ground emesis. Pneumoperitoneum on portable chest x-ray 0555 hours today. EXAM: CT CHEST, ABDOMEN AND PELVIS WITHOUT CONTRAST TECHNIQUE: Multidetector CT imaging of the chest, abdomen and pelvis was performed following the standard protocol without IV contrast. COMPARISON:  CT Abdomen and Pelvis 05/03/2021. cervical spine CT 02/05/2021. FINDINGS: CT CHEST FINDINGS Cardiovascular: No cardiomegaly or pericardial effusion. Mild thoracic aortic calcified atherosclerosis. Mediastinum/Nodes: Mildly dilated mostly fluid-filled esophagus. No mediastinal lymphadenopathy. Lungs/Pleura: Extensive bilateral upper lobe peribronchial consolidation, new since March. Respiratory motion artifact. Major airways remain patent. Small layering pleural effusions greater on the right. Mildly loculated right upper lobe effusion. Compressive atelectasis in the right lower lobe, although with some air bronchograms at the lung base. Musculoskeletal: Degenerative changes in the spine. Respiratory motion obscuring some rib detail. No acute osseous abnormality identified. CT ABDOMEN PELVIS FINDINGS Hepatobiliary: Stable noncontrast liver. 19 mm gallstone. No definite gallbladder inflammation. Pancreas: Grossly negative noncontrast pancreas. Spleen: Diminutive spleen.  Small volume perisplenic fluid. Adrenals/Urinary Tract: Adrenal glands remain within normal limits. Acute right nephromegaly with right hydronephrosis, right hydroureter and perinephric hematoma also tracking along the proximal ureter. Layering hematoma in the right retroperitoneum. The volume of blood is overall moderate as seen on coronal image 67. Superimposed benign exophytic bilateral renal cysts with simple fluid density and  larger at the left lower pole. No urinary calculus identified. The left renal collecting system also appears mildly dilated compared to May, and there is new left hydroureter. The bladder is markedly distended with an estimated bladder volume of 1200 mL now. The bladder was decompressed last month. Stomach/Bowel: Small volume of free intraperitoneal air scattered bilaterally, most apparent in the ventral upper abdomen. Small volume of complex free fluid in both lower pericolic gutters. Small to moderate volume of free fluid about the liver. 10 cm stool ball in the rectum. Decompressed upstream sigmoid colon. Diverticulosis in the sigmoid and distal descending colon with no active inflammation evident. Gas in nondilated transverse colon. Mild retained stool in the right colon. Appendix is not delineated. The terminal ileum is nondilated. No dilated small bowel. Fluid-filled esophagus but relatively decompressed stomach. Vascular/Lymphatic: Aortoiliac calcified atherosclerosis. Normal caliber abdominal aorta. Vascular patency is not evaluated in the absence of IV contrast. No lymphadenopathy identified. Reproductive: Fibroid uterus is displaced superiorly and to the left by the urinary bladder. Otherwise negative. Other: Small volume pelvic free fluid with mildly complex fluid density. Musculoskeletal: Widespread chronic lumbar ankylosis. Advanced lower lumbar degeneration. No acute or suspicious osseous lesion identified. IMPRESSION: 1. Small volume free air and small to moderate mildly complex ascites consistent with perforated bowel. The site of perforation is not delineated, but there is evidence of rectal fecal impaction. 2. Severe urinary retention (1200 mL bladder now) with bilateral obstructive uropathy and superimposed moderate sized acute right perinephric hemorrhage. 3. Bilateral upper lobe pneumonia, new since March. Small left and small to moderate right pleural effusions, mildly loculated on the right.  4. Chronic cholelithiasis without definite acute cholecystitis. Fibroid  uterus. Advanced spinal degeneration, lumbar ankylosis. Electronically Signed: By: Genevie Ann M.D. On: 05/21/2021 07:27    Procedures Procedures   CRITICAL CARE Performed by: Gwenyth Allegra Latoia Eyster Total critical care time: 60 minutes Critical care time was exclusive of separately billable procedures and treating other patients. Critical care was necessary to treat or prevent imminent or life-threatening deterioration. Critical care was time spent personally by me on the following activities: development of treatment plan with patient and/or surrogate as well as nursing, discussions with consultants, evaluation of patient's response to treatment, examination of patient, obtaining history from patient or surrogate, ordering and performing treatments and interventions, ordering and review of laboratory studies, ordering and review of radiographic studies, pulse oximetry and re-evaluation of patient's condition.   Medications Ordered in ED Medications  piperacillin-tazobactam (ZOSYN) IVPB 3.375 g (3.375 g Intravenous New Bag/Given 05/21/21 0704)  sodium chloride 0.9 % bolus 1,000 mL (0 mLs Intravenous Stopped 05/21/21 4193)    ED Course  I have reviewed the triage vital signs and the nursing notes.  Pertinent labs & imaging results that were available during my care of the patient were reviewed by me and considered in my medical decision making (see chart for details).    MDM Rules/Calculators/A&P                          NECHUMA BOVEN is a 80 y.o. female with a past medical history significant for prior stroke, hypertension, hyperlipidemia, dementia, and recent admission for UTI sepsis found to have intermittent atrial fibrillation and placed on Eliquis therapy who presents from her facility for black vomit, shortness of breath, abdominal pain, nausea, vomiting, and altered mental status.  According to EMS report from  facility, patient woke up and was having black vomit and then was not able to maintain her oxygen saturations like normal.  She also had soft pressures.  Patient is complaining of diffuse abdominal pain.  On exam, patient is in respiratory distress.  Oxygen saturations are in the mid 80s on 12 L.  Lungs have coarse breath sounds bilaterally.  Abdomen is tender and distended.  Patient is in respiratory distress and not able to answer questions appropriately.  Husband was called who confirmed that patient is DNR/DNI but if she needs a procedure that is lifesaving, they would have a discussion about it.  With the report of black vomit, and recent initiation of anticoagulation, I am concerned about GI bleed versus some sort of bowel injury or perforation.  Portable chest x-ray ordered, labs ordered, type and screen ordered, she recently had coronavirus so will not order that.  Patient started on some fluids and blood pressure started to improve.   6:11 AM Just spoke with critical care who reviewed the case initially.  As she has been confirmed now with both the chart and her husband that she is DNR/DNI, he recommends admitting to medicine, completing the work-up to determine etiology of her symptoms but suspects that she may need to end up being comfort care if there is significant clinical worsening.  When I last checked on her, her oxygen saturations are now in the low 90s, her blood pressure is above 100, and she is seeming more comfortable.  Her initial hemoglobin was 15.  I spoke to gastroenterology as well who is concerned that patient may have something like aspiration from her vomiting versus bowel perforation or ischemic gut.  He recommended not reversing her anticoagulation at this time  and would come see the patient in the emergency department.  Will call for medicine admission when more information returns.  6:25 AM Portable x-ray shows free air under the diaphragm.  Now I am more concerned  about bowel perforation or ischemic gut.  Will call general surgery and will give broad-spectrum antibiotics.  7:37 AM Radiology just called about the CT results.  Patient does indeed have perfect bowel at some location.  She also has likely fecal impaction.  There is also a obstruction of some kind in her bladder leading to bilateral hydronephrosis and likely left renal hemorrhage.  Will place a Foley catheter to help decompress her bladder.  The radiologist suspected that the bladder outlet obstruction may be related to the impaction although he is unsure if that is related to the perforated bowel.  Care transferred to oncoming team while waiting for recommendations from general surgery.  Anticipate if the patient has nonsurvivable medical condition, she will be admitted to a medicine service for comfort measures and further monitoring.   Final Clinical Impression(s) / ED Diagnoses Final diagnoses:  Generalized abdominal pain  Perforated bowel (HCC)  Bladder outlet obstruction  AKI (acute kidney injury) ([place])     Clinical Impression: 1. Generalized abdominal pain   2. Abdominal pain   3. Perforated bowel ([place])   4. Bladder outlet obstruction   5. AKI (acute kidney injury) ([place])     Disposition: Admit  This note was prepared with assistance of Dragon voice recognition software. Occasional wrong-word or sound-a-like substitutions may have occurred due to the inherent limitations of voice recognition software.     Orie Cuttino, Gwenyth Allegra, MD 05/21/21 [phone]

## 2021-05-21 NOTE — ED Notes (Signed)
Verbal order for 0.5 mg Dilaudid to be given prior to pt transport

## 2021-05-21 NOTE — ED Provider Notes (Addendum)
Patient care signed out to follow-up surgery recommendations.  Patient with complex medical presentation thus far showing perforation, impaction, urinary retention, hypoxia.  Patient is DNR/DNI and based on surgeries discussion with the patient there is a high risk of death with surgery and requesting palliative care at this time.  Palliative care repaged, medicine paged for admission.  Antibiotics were given.  Golda Acre, MD 05/21/21 1001    Elnora Morrison, MD 05/21/21 1002

## 2021-05-21 NOTE — Consult Note (Addendum)
[facility] Surgery Consult Note  Jennifer Arroyo 05/16/1941  [id number].    Requesting MD: Sherry Ruffing, MD Chief Complaint/Reason for Consult: pneumoperitoneum HPI:  [name] is an 80 y/o F with MMP including but not limited to CVA, dementia, recent hospital admission for sepsis, who presented from a nursing facility via EMS with coffee ground emesis, abdominal pain, and hypoxia.   CT of the chest/abdomen/pelvis was significant for pneumoperitoneum, complex ascites, urinary retention with obstructive uropathy and right perinephric hemorrhage, and bilateral upper lobe PNA with mod right pleural effusion and surgery was asked to see.   ROS: Review of Systems  Constitutional:  Positive for diaphoresis and malaise/fatigue.  HENT: Negative.    Eyes: Negative.   Respiratory:  Positive for sputum production, shortness of breath and wheezing.   Cardiovascular:  Negative for chest pain.  Gastrointestinal:  Positive for abdominal pain and constipation.  Genitourinary: Negative.   Musculoskeletal: Negative.   Skin: Negative.   Neurological: Negative.   Endo/Heme/Allergies:  Bruises/bleeds easily.  Psychiatric/Behavioral: Negative.     Family History  Problem Relation Age of Onset   Stroke Mother    Cancer Father    Healthy Brother    Liver cancer Father    Hypertension Brother    Hyperlipidemia Brother    Rheumatic fever Mother    Heart disease Mother     Past Medical History:  Diagnosis Date   Bilateral bunions    hammer toe   Cervical os stenosis    CVA (cerebral vascular accident) ([place])    Gallstones    Hemangioma of liver    probable   Hemorrhoids    Hyperlipemia    Hypertension    Insomnia    Left bundle branch block    Left bundle branch block    Low back pain    Memory loss    Plantar fasciitis    Rectocele    Renal cyst    Stroke ([place]) 01/2018    Past Surgical History:  Procedure Laterality Date   APPENDECTOMY     COLONOSCOPY     TONSILLECTOMY       Social History:  reports that she quit smoking about 42 years ago. Her smoking use included cigarettes. She smoked an average of 0.50 packs per day. She has never used smokeless tobacco. She reports current drug use. She reports that she does not drink alcohol.  Allergies:  Allergies  Allergen Reactions   Amoxicillin Nausea Only   Ceclor [Cefaclor] Hives   Levaquin [Levofloxacin] Other (See Comments)   Nystatin Other (See Comments)    Black scale around teeth.    (Not in a hospital admission)   Blood pressure 90/71, pulse 78, temperature (!) 97.5 F (36.4 C), temperature source Oral, resp. rate (!) 33, height 5\' 6"  (1.676 m), weight 86 kg, SpO2 94 %. Physical Exam: Constitutional: ill appearing elderly female in respiratory distress Eyes: Moist conjunctiva; no lid lag; anicteric; PERRL Neck: Trachea midline; no thyromegaly Lungs: labored on 15L [place], O2 sats 93% during my exam, +rales CV: RRR; no palpable thrills; no pitting edema GI: Abd obese, TTP without peritonitis; no palpable hepatosplenomegaly MSK: symmetrical, no clubbing/cyanosis Psychiatric: Appropriate affect; alert and oriented x2 - person, [place], 2002, appropriate speech Lymphatic: No palpable cervical or axillary lymphadenopathy Neuro: alert, following commands, moving all extremities  Results for orders placed or performed during the hospital encounter of 05/21/21 (from the past 48 hour(s))  Type and screen [place]  Status: None   Collection Time: 05/21/21  5:20 AM  Result Value Ref Range   ABO/RH(D) O POS    Antibody Screen NEG    Sample Expiration      05/24/2021,2359 Performed at [facility], [place] [address], [place], [place] [address]   ABO/Rh     Status: None   Collection Time: 05/21/21  5:30 AM  Result Value Ref Range   ABO/RH(D)      O POS Performed at [facility] [address] [place], [place] [address]   CBC with Differential     Status: Abnormal    Collection Time: 05/21/21  5:35 AM  Result Value Ref Range   WBC 3.4 (L) 4.0 - 10.5 K/uL   RBC 4.14 3.87 - 5.11 MIL/uL   Hemoglobin 13.9 12.0 - 15.0 g/dL   HCT 42.5 36.0 - 46.0 %   MCV 102.7 (H) 80.0 - 100.0 fL   MCH 33.6 26.0 - 34.0 pg   MCHC 32.7 30.0 - 36.0 g/dL   RDW 14.9 11.5 - 15.5 %   Platelets 466 (H) 150 - 400 K/uL    Comment: REPEATED TO VERIFY   nRBC 0.0 0.0 - 0.2 %   Neutrophils Relative % 68 %   Neutro Abs 2.4 1.7 - 7.7 K/uL   Band Neutrophils 4 %   Lymphocytes Relative 18 %   Lymphs Abs 0.6 (L) 0.7 - 4.0 K/uL   Monocytes Relative 6 %   Monocytes Absolute 0.2 0.1 - 1.0 K/uL   Eosinophils Relative 4 %   Eosinophils Absolute 0.1 0.0 - 0.5 K/uL   Basophils Relative 0 %   Basophils Absolute 0.0 0.0 - 0.1 K/uL   nRBC 3 (H) 0 /100 WBC   Abs Immature Granulocytes 0.00 0.00 - 0.07 K/uL   Polychromasia PRESENT     Comment: Performed at [facility], [place] [address], [place], [place] [address]  Comprehensive metabolic panel     Status: Abnormal   Collection Time: 05/21/21  5:35 AM  Result Value Ref Range   Sodium 139 135 - 145 mmol/L   Potassium 5.2 (H) 3.5 - 5.1 mmol/L   Chloride 105 98 - 111 mmol/L   CO2 21 (L) 22 - 32 mmol/L   Glucose, Bld 130 (H) 70 - 99 mg/dL    Comment: Glucose reference range applies only to samples taken after fasting for at least 8 hours.   BUN 37 (H) 8 - 23 mg/dL   Creatinine, Ser 2.45 (H) 0.44 - 1.00 mg/dL   Calcium 8.8 (L) 8.9 - 10.3 mg/dL   Total Protein 6.2 (L) 6.5 - 8.1 g/dL   Albumin 2.2 (L) 3.5 - 5.0 g/dL   AST 40 15 - 41 U/L   ALT 40 0 - 44 U/L   Alkaline Phosphatase 82 38 - 126 U/L   Total Bilirubin 1.0 0.3 - 1.2 mg/dL   GFR, Estimated 19 (L) >60 mL/min    Comment: (NOTE) Calculated using the CKD-EPI Creatinine Equation (2021)    Anion gap 13 5 - 15    Comment: Performed at [facility], [place] [address], [place], [place] [address]  Troponin I (High Sensitivity)     Status: Abnormal   Collection Time: 05/21/21   5:35 AM  Result Value Ref Range   Troponin I (High Sensitivity) 19 (H) <18 ng/L    Comment: (NOTE) Elevated high sensitivity troponin I (hsTnI) values and significant  changes across serial measurements  may suggest ACS but many other  chronic and acute conditions are known to elevate hsTnI results.  Refer to the "Links" section for chest pain algorithms and additional  guidance. Performed at [facility], [place] [address], [place], [place] [address]   I-stat chem 8, ED (not at [facility] or [facility])     Status: Abnormal   Collection Time: 05/21/21  5:56 AM  Result Value Ref Range   Sodium 138 135 - 145 mmol/L   Potassium 5.0 3.5 - 5.1 mmol/L   Chloride 108 98 - 111 mmol/L   BUN 37 (H) 8 - 23 mg/dL   Creatinine, Ser 2.40 (H) 0.44 - 1.00 mg/dL   Glucose, Bld 125 (H) 70 - 99 mg/dL    Comment: Glucose reference range applies only to samples taken after fasting for at least 8 hours.   Calcium, Ion 1.13 (L) 1.15 - 1.40 mmol/L   TCO2 22 22 - 32 mmol/L   Hemoglobin 15.3 (H) 12.0 - 15.0 g/dL   HCT 45.0 36.0 - 46.0 %  I-Stat arterial blood gas, ED     Status: Abnormal   Collection Time: 05/21/21  6:33 AM  Result Value Ref Range   pH, Arterial 7.429 7.350 - 7.450   pCO2 arterial 30.4 (L) 32.0 - 48.0 mmHg   pO2, Arterial 68 (L) 83.0 - 108.0 mmHg   Bicarbonate 20.2 20.0 - 28.0 mmol/L   TCO2 21 (L) 22 - 32 mmol/L   O2 Saturation 94.0 %   Acid-base deficit 3.0 (H) 0.0 - 2.0 mmol/L   Sodium 140 135 - 145 mmol/L   Potassium 4.8 3.5 - 5.1 mmol/L   Calcium, Ion 1.19 1.15 - 1.40 mmol/L   HCT 43.0 36.0 - 46.0 %   Hemoglobin 14.6 12.0 - 15.0 g/dL   Patient temperature 98.0 F    Collection site Radial    Drawn by HIDE    Sample type ARTERIAL    DG Chest Portable 1 View  Addendum Date: 05/21/2021   ADDENDUM REPORT: 05/21/2021 06:25 ADDENDUM: Critical Value/emergent results were called by telephone at the time of interpretation on 05/21/2021 at 6:25 am to provider [facility] , who verbally  acknowledged these results. Electronically Signed   By: Lovena Le M.D.   On: 05/21/2021 06:25   Result Date: 05/21/2021 CLINICAL DATA:  Hypoxia EXAM: PORTABLE CHEST 1 VIEW COMPARISON:  Radiograph 05/10/2021 FINDINGS: Lung volumes are low. Some persistent biapical patchy opacities with more diffuse coarsened interstitial opacities and basilar atelectatic changes. No pneumothorax or visible effusion. Stable cardiomediastinal contours with a calcified aorta. Subdiaphragmatic lucency on the right, concerning for possible subdiaphragmatic free air. IMPRESSION: New right subdiaphragmatic lucency, concerning for possible free air. Persistent patchy opacities in bilateral apices similar to comparison prior. Could reflect residual infection or inflammation. Electronically Signed: By: Lovena Le M.D. On: 05/21/2021 06:21   CT CHEST ABDOMEN PELVIS WO CONTRAST  Addendum Date: 05/21/2021   ADDENDUM REPORT: 05/21/2021 07:35 ADDENDUM: Study discussed by telephone with Dr. Marda Stalker on 05/21/2021 at 0731 hours. Electronically Signed   By: Genevie Ann M.D.   On: 05/21/2021 07:35   Result Date: 05/21/2021 CLINICAL DATA:  80 year old female with shortness of breath, abdominal pain, coffee-ground emesis. Pneumoperitoneum on portable chest x-ray 0555 hours today. EXAM: CT CHEST, ABDOMEN AND PELVIS WITHOUT CONTRAST TECHNIQUE: Multidetector CT imaging of the chest, abdomen and pelvis was performed following the standard protocol without IV contrast. COMPARISON:  CT Abdomen and Pelvis 05/03/2021. cervical spine CT 02/05/2021.  FINDINGS: CT CHEST FINDINGS Cardiovascular: No cardiomegaly or pericardial effusion. Mild thoracic aortic calcified atherosclerosis. Mediastinum/Nodes: Mildly dilated mostly fluid-filled esophagus. No mediastinal lymphadenopathy. Lungs/Pleura: Extensive bilateral upper lobe peribronchial consolidation, new since March. Respiratory motion artifact. Major airways remain patent. Small layering pleural  effusions greater on the right. Mildly loculated right upper lobe effusion. Compressive atelectasis in the right lower lobe, although with some air bronchograms at the lung base. Musculoskeletal: Degenerative changes in the spine. Respiratory motion obscuring some rib detail. No acute osseous abnormality identified. CT ABDOMEN PELVIS FINDINGS Hepatobiliary: Stable noncontrast liver. 19 mm gallstone. No definite gallbladder inflammation. Pancreas: Grossly negative noncontrast pancreas. Spleen: Diminutive spleen.  Small volume perisplenic fluid. Adrenals/Urinary Tract: Adrenal glands remain within normal limits. Acute right nephromegaly with right hydronephrosis, right hydroureter and perinephric hematoma also tracking along the proximal ureter. Layering hematoma in the right retroperitoneum. The volume of blood is overall moderate as seen on coronal image 67. Superimposed benign exophytic bilateral renal cysts with simple fluid density and larger at the left lower pole. No urinary calculus identified. The left renal collecting system also appears mildly dilated compared to May, and there is new left hydroureter. The bladder is markedly distended with an estimated bladder volume of 1200 mL now. The bladder was decompressed last month. Stomach/Bowel: Small volume of free intraperitoneal air scattered bilaterally, most apparent in the ventral upper abdomen. Small volume of complex free fluid in both lower pericolic gutters. Small to moderate volume of free fluid about the liver. 10 cm stool ball in the rectum. Decompressed upstream sigmoid colon. Diverticulosis in the sigmoid and distal descending colon with no active inflammation evident. Gas in nondilated transverse colon. Mild retained stool in the right colon. Appendix is not delineated. The terminal ileum is nondilated. No dilated small bowel. Fluid-filled esophagus but relatively decompressed stomach. Vascular/Lymphatic: Aortoiliac calcified atherosclerosis.  Normal caliber abdominal aorta. Vascular patency is not evaluated in the absence of IV contrast. No lymphadenopathy identified. Reproductive: Fibroid uterus is displaced superiorly and to the left by the urinary bladder. Otherwise negative. Other: Small volume pelvic free fluid with mildly complex fluid density. Musculoskeletal: Widespread chronic lumbar ankylosis. Advanced lower lumbar degeneration. No acute or suspicious osseous lesion identified. IMPRESSION: 1. Small volume free air and small to moderate mildly complex ascites consistent with perforated bowel. The site of perforation is not delineated, but there is evidence of rectal fecal impaction. 2. Severe urinary retention (1200 mL bladder now) with bilateral obstructive uropathy and superimposed moderate sized acute right perinephric hemorrhage. 3. Bilateral upper lobe pneumonia, new since March. Small left and small to moderate right pleural effusions, mildly loculated on the right. 4. Chronic cholelithiasis without definite acute cholecystitis. Fibroid uterus. Advanced spinal degeneration, lumbar ankylosis. Electronically Signed: By: Genevie Ann M.D. On: 05/21/2021 07:27    Assessment/Plan HTN HLD PMH CVAPMH covid-19 afib on Eliquis    Dementia Code status: DNR/DNI Acute on chronic respiratory failure with hypoxia   80 y/o F with above multiple medical issues who presents from SNF with tachypnea, hypoxia, abdominal pain, and workup consistent with intestinal perforation, pneumonia, respiratory failure, acute renal failure, and urinary retention. The patients is extremely high risk for perioperative complications and death due to her comorbid conditions. Surgery for this would be exploratory laparotomy with possible bowel resection, possible ostomy, possibility of being left open and needing additional surgery. NSQUIP surgical risk calculator gives her over a 60% risk of perioperative complication and over 27% risk of death during or after her  surgery. I  discussed the perioperative risks with the patient and her husband and they would like to speak to the palliative care team about what non-operative management would look like. I agree with this decision. Should the family have further questions about surgery, please call us back.     Jill Alexanders, PA-C [facility] Please see Amion for pager number during day hours 7:00am-4:30pm 05/21/2021, 9:12 AM

## 2021-05-21 NOTE — Consult Note (Signed)
Consultation Note Date: 05/21/2021   Patient Name: Jennifer Arroyo  DOB: [birth year]  MRN: [id number]  Age / Sex: 80 y.o., female  PCP: Pcp, No Referring Physician: Elnora Morrison, MD  Reason for Consultation: Establishing goals of care and Terminal Care  HPI/Patient Profile: 80 y.o. female  with past medical history of advanced dementia, recent admission for COVID, aspiration pneumonia, AKI, sepsis- discharged to [facility]  admitted on 05/21/2021 with findings of perforated bowel, obstructive uropathy and perinephric hemorrhage, recurrent aspiration PNA. She is a poor candidate for surgery. Noted Palliative medicine consulted for goals of care.   Clinical Assessment and Goals of Care: Evaluated patient- spouse at bedside. She is awake and alert- having abdominal pain, does not feel good.  Met with her husband, Jennifer Arroyo.  His primary goal is comfort.  They have been married for 45 years. She has significant dementia that is impacting her quality of life. He understands that having surgery is a very high risk and will not add to her quality.  We discussed comfort measures only including stopping IV fluids, antibiotics, and providing pain and other symptom management.  Mr. Carie would like to seek placement at an inpatient Hospice facility.    Primary Decision Maker NEXT OF KIN - spouse- Jennifer Arroyo    SUMMARY OF RECOMMENDATIONS - Comfort measures only -Start hydromorphone infusion with bolus dosing -Referral for inpatient hospice -Other comfort measures as ordered  Code Status/Advance Care Planning: DNR  Prognosis:   < 2 weeks  Discharge Planning: Hospice facility pending evaluation and bed availability  Primary Diagnoses: Present on Admission: **None**   I have reviewed the medical record, interviewed the patient and family, and examined the patient. The following aspects are  pertinent.   Review of Systems  Unable to perform ROS: Dementia   Physical Exam Vitals and nursing note reviewed.  Abdominal:     General: There is distension.  Neurological:     Mental Status: She is alert. She is disoriented.   IO: Intake/output summary: No intake or output data in the 24 hours ending 05/21/21 1029  LBM:   Baseline Weight: Weight: 86 kg Most recent weight: Weight: 86 kg     Palliative Assessment/Data: PPS: 20%     Thank you for this consult. Palliative medicine will continue to follow and assist as needed.   Time In: 0928 Time Out: 1043 Time Total: 75 minutes Greater than 50%  of this time was spent counseling and coordinating care related to the above assessment and plan.  Signed by: Mariana Kaufman, AGNP-C Palliative Medicine    Please contact Palliative Medicine Team phone at [phone] for questions and concerns.  For individual provider: See Shea Evans

## 2021-05-21 NOTE — ED Notes (Signed)
[facility] to transport pt to [facility]

## 2021-05-26 DIAGNOSIS — I10 Essential (primary) hypertension: Secondary | ICD-10-CM | POA: Diagnosis not present

## 2021-05-26 DIAGNOSIS — I4891 Unspecified atrial fibrillation: Secondary | ICD-10-CM | POA: Diagnosis not present

## 2021-05-26 DIAGNOSIS — E7800 Pure hypercholesterolemia, unspecified: Secondary | ICD-10-CM | POA: Diagnosis not present

## 2021-06-05 DEATH — deceased

## 2021-08-27 NOTE — Progress Notes (Deleted)
Cardiology Office Note:    Date:  08/27/2021   ID:  Jennifer Arroyo, DOB 1941/06/26, MRN [id number]  PCP:  Pcp, No   [facility] Providers Cardiologist:  Freada Bergeron, MD { Click to update primary MD,subspecialty MD or APP then REFRESH:1}  *** Referring MD: No ref. provider found   Chief Complaint:  No chief complaint on file. {Click here for Visit Info    :1}   Patient Profile:   Jennifer Arroyo is a 80 y.o. female with:  Paroxysmal atrial fibrillation  Admx in 10/21 w AF w/ RVR >> NSR w rate ctl Rx Apixaban Rx Admx w AF w RVR ISO urosepsis, incidental COVID-19 Hx of CVA Hypertension  Hyperlipidemia  Carotid artery dz Dementia  LBBB DNR/DNI   Prior CV studies: CAROTID US 09/20/20 Bilat  ICA 1-39  ECHOCARDIOGRAM 09/20/20  1. Left ventricular ejection fraction, by estimation, is 60 to 65%. The  left ventricle has normal function. The left ventricle has no regional  wall motion abnormalities. There is mild left ventricular hypertrophy.  Left ventricular diastolic parameters  were normal.   2. Right ventricular systolic function is normal. The right ventricular  size is normal. Mildly increased right ventricular wall thickness. There  is normal pulmonary artery systolic pressure.   3. The mitral valve is normal in structure. No evidence of mitral valve  regurgitation. No evidence of mitral stenosis.   4. The aortic valve is grossly normal. There is mild calcification of the  aortic valve. Aortic valve regurgitation is not visualized. No aortic  stenosis is present.   5. The inferior vena cava is normal in size with greater than 50%  respiratory variability, suggesting right atrial pressure of 3 mmHg.   6. Increased flow velocities may be secondary to anemia, thyrotoxicosis,  hyperdynamic or high flow state.  {Select studies to display:26339}   History of Present Illness: Ms. Fretwell was last seen by Dr. Johney Frame in 12/21.   She was admitted in 5/22 with  AFib with RVR in the setting of urosepsis complicated by acute hypoxic respiratory failure due to aspiration pneumonia, AKI and incidental COVID-19 infection.  She was followed by Cardiology and started on Amiodarone with return of NSR.  She was seen by palliative care and DC to SNF with Hospice services.  She was seen in the ED in 6/22 with perforated bowel, AKI and hypoxic respiratory failure.  She was noted to be DNR/DNI and was DC to SNF with comfort care measures only.  She returns for f/u.  ***    Past Medical History:  Diagnosis Date   Bilateral bunions    hammer toe   Cervical os stenosis    CVA (cerebral vascular accident) ([place])    Gallstones    Hemangioma of liver    probable   Hemorrhoids    Hyperlipemia    Hypertension    Insomnia    Left bundle branch block    Left bundle branch block    Low back pain    Memory loss    Plantar fasciitis    Rectocele    Renal cyst    Stroke ([place]) 01/2018   Current Medications: No outpatient medications have been marked as taking for the 08/28/21 encounter (Appointment) with Richardson Dopp T, PA-C.    Allergies:   Amoxicillin, Ceclor [cefaclor], Levaquin [levofloxacin], and Nystatin   Social History   Tobacco Use   Smoking status: Former    Packs/day: 0.50    Types: Cigarettes  Quit date: 12/08/1978    Years since quitting: 42.7   Smokeless tobacco: Never   Tobacco comments:    quit at age 9, 1 PPD x 10 year history  Vaping Use   Vaping Use: Never used  Substance Use Topics   Alcohol use: Never   Drug use: Yes    Comment: back in the 1980s. hasnt smoked since then    Family Hx: The patient's family history includes Cancer in her father; Healthy in her brother; Heart disease in her mother; Hyperlipidemia in her brother; Hypertension in her brother; Liver cancer in her father; Rheumatic fever in her mother; Stroke in her mother.  ROS   EKGs/Labs/Other Test Reviewed:    EKG:  EKG is *** ordered today.  The ekg ordered  today demonstrates ***  Recent Labs: 05/05/2021: TSH 1.205 05/15/2021: Magnesium 1.8 05/21/2021: ALT 40; BUN 37; Creatinine, Ser 2.40; Hemoglobin 14.6; Platelets 466; Potassium 4.8; Sodium 140   Recent Lipid Panel Lab Results  Component Value Date/Time   CHOL 135 09/20/2020 04:18 AM   TRIG 88 09/20/2020 04:18 AM   HDL 46 09/20/2020 04:18 AM   LDLCALC 71 09/20/2020 04:18 AM     Risk Assessment/Calculations:   {Does this patient have ATRIAL FIBRILLATION?:[id number]}      Physical Exam:    VS:  There were no vitals taken for this visit.    Wt Readings from Last 3 Encounters:  05/21/21 189 lb 9.5 oz (86 kg)  05/04/21 189 lb 9.5 oz (86 kg)  02/05/21 170 lb (77.1 kg)    Physical Exam ***     ASSESSMENT & PLAN:   {Select for Dx:25819}    {Are you ordering a CV Procedure (e.g. stress test, cath, DCCV, TEE, etc)?   Press F2        :[id number]}   Dispo:  No follow-ups on file.   Medication Adjustments/Labs and Tests Ordered: Current medicines are reviewed at length with the patient today.  Concerns regarding medicines are outlined above.  Tests Ordered: No orders of the defined types were placed in this encounter.  Medication Changes: No orders of the defined types were placed in this encounter.  Signed, Richardson Dopp, PA-C  08/27/2021 9:55 PM    [facility] [place], [place], [place]  [address] Phone: [phone]; Fax: [phone]

## 2021-08-28 ENCOUNTER — Ambulatory Visit: Payer: Medicare Other | Admitting: Physician Assistant

## 2021-08-28 DIAGNOSIS — Z79899 Other long term (current) drug therapy: Secondary | ICD-10-CM

## 2021-08-28 DIAGNOSIS — I480 Paroxysmal atrial fibrillation: Secondary | ICD-10-CM

## 2021-08-28 DIAGNOSIS — E7800 Pure hypercholesterolemia, unspecified: Secondary | ICD-10-CM

## 2021-08-28 DIAGNOSIS — F0390 Unspecified dementia without behavioral disturbance: Secondary | ICD-10-CM

## 2021-08-28 DIAGNOSIS — I10 Essential (primary) hypertension: Secondary | ICD-10-CM

## 2021-08-28 DIAGNOSIS — Z8673 Personal history of transient ischemic attack (TIA), and cerebral infarction without residual deficits: Secondary | ICD-10-CM

## 2022-01-06 ENCOUNTER — Ambulatory Visit: Payer: Self-pay | Admitting: Neurology

## 2023-02-22 IMAGING — DX DG CHEST 1V
1 series · 1 of 1 positions shown · non-contrast
Comparison: Portable exam 4543 hours compared to 05/03/2021

CLINICAL DATA: Increased difficulty in breathing

EXAM:
CHEST  1 VIEW

[chest ap]
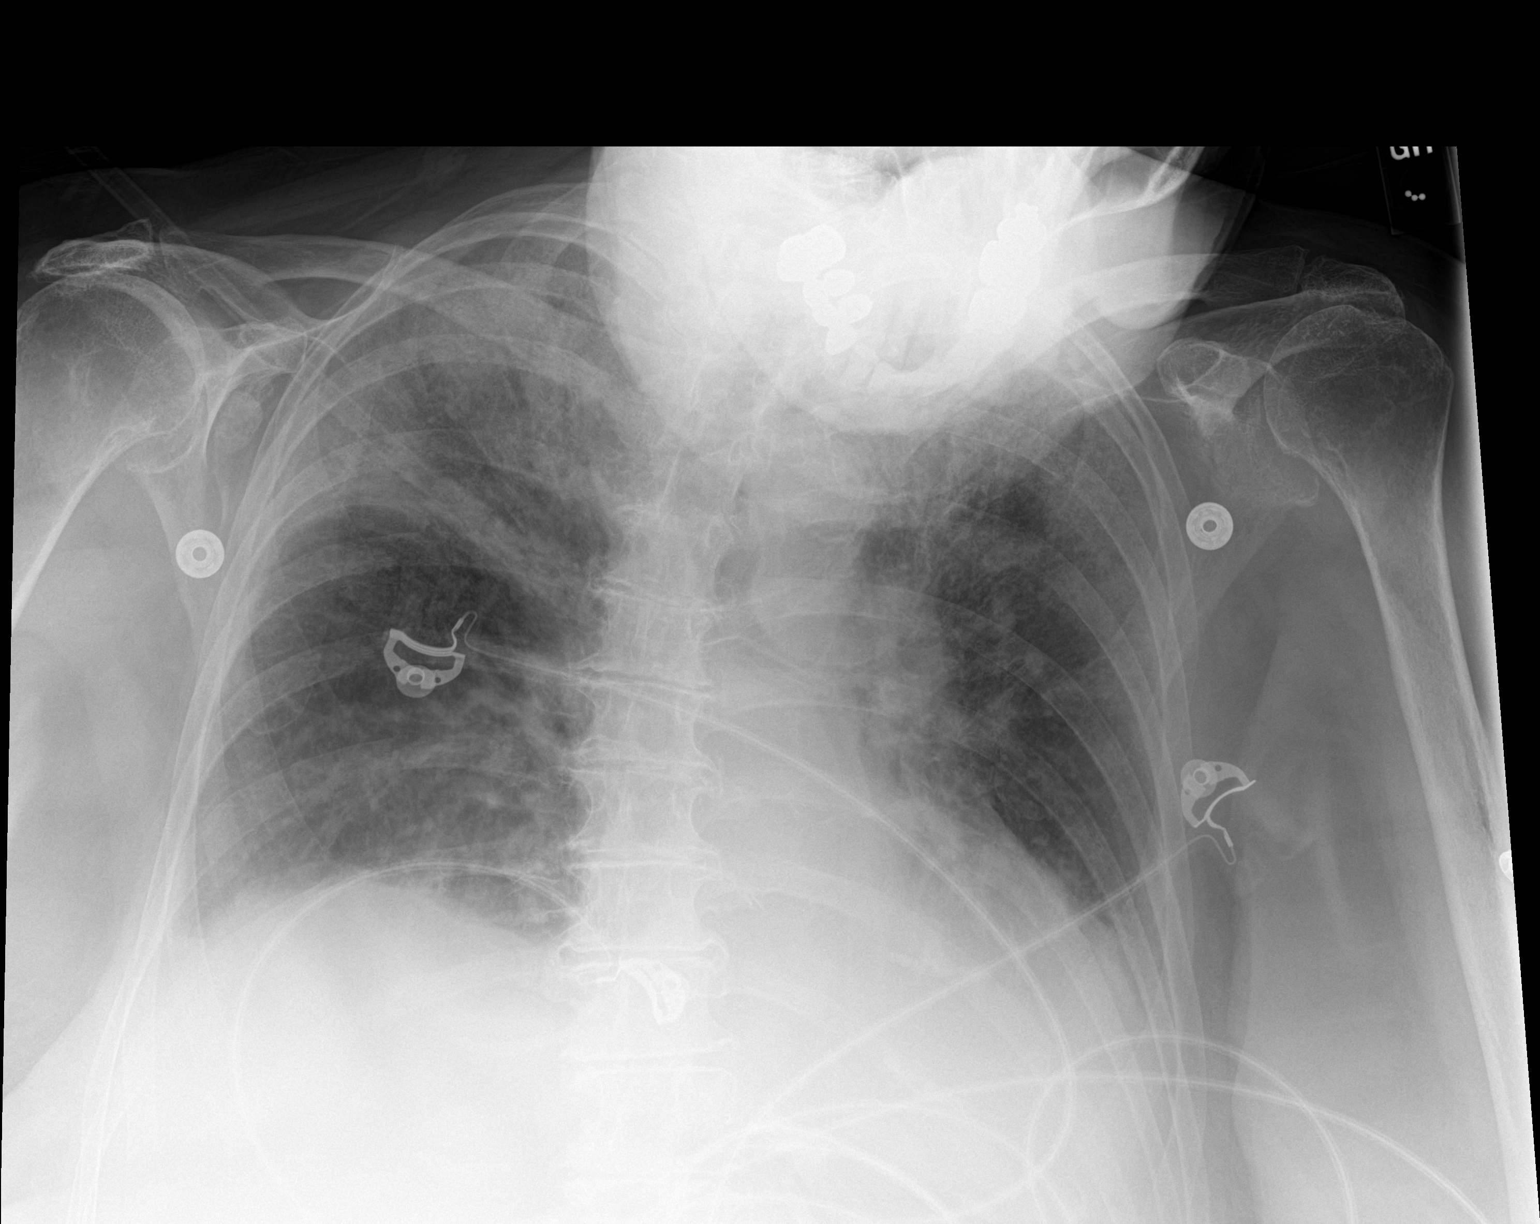

[1 of 1 positions shown; findings below may reference images not displayed]

FINDINGS: Normal heart size mediastinal contours.

New patchy pulmonary infiltrate identified in BILATERAL upper lobes
greater on RIGHT and at both lung bases greater on LEFT.

Findings favor multifocal pneumonia.

No pleural effusion or pneumothorax.

Bones demineralized.
IMPRESSION: New BILATERAL pulmonary infiltrates consistent with multifocal
pneumonia.
